# Patient Record
Sex: Male | Born: 1950
Health system: Southern US, Community
[De-identification: ages and names within clinical notes are randomized; demographics above are authoritative.]

## PROBLEM LIST (undated history)

## (undated) DIAGNOSIS — I499 Cardiac arrhythmia, unspecified: Secondary | ICD-10-CM

## (undated) DIAGNOSIS — J449 Chronic obstructive pulmonary disease, unspecified: Secondary | ICD-10-CM

## (undated) DIAGNOSIS — J45909 Unspecified asthma, uncomplicated: Secondary | ICD-10-CM

## (undated) DIAGNOSIS — M1990 Unspecified osteoarthritis, unspecified site: Secondary | ICD-10-CM

## (undated) DIAGNOSIS — I10 Essential (primary) hypertension: Secondary | ICD-10-CM

---

## 1997-11-14 ENCOUNTER — Other Ambulatory Visit: Admission: RE | Admit: 1997-11-14 | Discharge: 1997-11-14 | Payer: Self-pay | Admitting: Family Medicine

## 1997-11-22 ENCOUNTER — Other Ambulatory Visit: Admission: RE | Admit: 1997-11-22 | Discharge: 1997-11-22 | Payer: Self-pay | Admitting: Family Medicine

## 1997-12-17 ENCOUNTER — Other Ambulatory Visit: Admission: RE | Admit: 1997-12-17 | Discharge: 1997-12-17 | Payer: Self-pay | Admitting: Family Medicine

## 1998-01-31 ENCOUNTER — Other Ambulatory Visit: Admission: RE | Admit: 1998-01-31 | Discharge: 1998-01-31 | Payer: Self-pay | Admitting: Family Medicine

## 1998-03-04 ENCOUNTER — Other Ambulatory Visit: Admission: RE | Admit: 1998-03-04 | Discharge: 1998-03-04 | Payer: Self-pay | Admitting: Family Medicine

## 1998-05-07 ENCOUNTER — Ambulatory Visit (HOSPITAL_COMMUNITY): Admission: RE | Admit: 1998-05-07 | Discharge: 1998-05-07 | Payer: Self-pay | Admitting: Cardiovascular Disease

## 1998-05-08 ENCOUNTER — Ambulatory Visit: Admission: RE | Admit: 1998-05-08 | Discharge: 1998-05-08 | Payer: Self-pay | Admitting: *Deleted

## 1998-05-12 ENCOUNTER — Inpatient Hospital Stay (HOSPITAL_COMMUNITY): Admission: RE | Admit: 1998-05-12 | Discharge: 1998-05-17 | Payer: Self-pay | Admitting: *Deleted

## 1998-05-13 ENCOUNTER — Encounter: Payer: Self-pay | Admitting: Cardiothoracic Surgery

## 1998-05-14 ENCOUNTER — Encounter: Payer: Self-pay | Admitting: Cardiothoracic Surgery

## 1998-05-15 ENCOUNTER — Encounter: Payer: Self-pay | Admitting: Cardiothoracic Surgery

## 1998-05-21 ENCOUNTER — Emergency Department (HOSPITAL_COMMUNITY): Admission: EM | Admit: 1998-05-21 | Discharge: 1998-05-21 | Payer: Self-pay | Admitting: Emergency Medicine

## 1998-05-21 ENCOUNTER — Encounter: Payer: Self-pay | Admitting: Emergency Medicine

## 1998-09-29 ENCOUNTER — Encounter: Payer: Self-pay | Admitting: Emergency Medicine

## 1998-09-30 ENCOUNTER — Inpatient Hospital Stay (HOSPITAL_COMMUNITY): Admission: EM | Admit: 1998-09-30 | Discharge: 1998-10-02 | Payer: Self-pay | Admitting: Emergency Medicine

## 1999-01-15 ENCOUNTER — Ambulatory Visit (HOSPITAL_COMMUNITY): Admission: RE | Admit: 1999-01-15 | Discharge: 1999-01-15 | Payer: Self-pay | Admitting: Internal Medicine

## 1999-01-29 ENCOUNTER — Emergency Department (HOSPITAL_COMMUNITY): Admission: EM | Admit: 1999-01-29 | Discharge: 1999-01-29 | Payer: Self-pay | Admitting: Emergency Medicine

## 1999-01-29 ENCOUNTER — Encounter: Payer: Self-pay | Admitting: Emergency Medicine

## 1999-06-22 ENCOUNTER — Emergency Department (HOSPITAL_COMMUNITY): Admission: EM | Admit: 1999-06-22 | Discharge: 1999-06-22 | Payer: Self-pay | Admitting: Emergency Medicine

## 1999-06-22 ENCOUNTER — Encounter: Payer: Self-pay | Admitting: Emergency Medicine

## 1999-06-22 ENCOUNTER — Encounter: Payer: Self-pay | Admitting: *Deleted

## 2000-07-25 ENCOUNTER — Ambulatory Visit (HOSPITAL_BASED_OUTPATIENT_CLINIC_OR_DEPARTMENT_OTHER): Admission: RE | Admit: 2000-07-25 | Discharge: 2000-07-25 | Payer: Self-pay | Admitting: *Deleted

## 2000-11-06 ENCOUNTER — Emergency Department (HOSPITAL_COMMUNITY): Admission: EM | Admit: 2000-11-06 | Discharge: 2000-11-06 | Payer: Self-pay | Admitting: Emergency Medicine

## 2000-11-18 ENCOUNTER — Encounter: Payer: Self-pay | Admitting: Surgery

## 2000-11-21 ENCOUNTER — Ambulatory Visit (HOSPITAL_COMMUNITY): Admission: RE | Admit: 2000-11-21 | Discharge: 2000-11-21 | Payer: Self-pay | Admitting: Surgery

## 2000-11-21 ENCOUNTER — Encounter: Payer: Self-pay | Admitting: Surgery

## 2000-11-21 ENCOUNTER — Encounter: Admission: RE | Admit: 2000-11-21 | Discharge: 2000-11-21 | Payer: Self-pay | Admitting: Surgery

## 2000-11-22 ENCOUNTER — Ambulatory Visit (HOSPITAL_COMMUNITY): Admission: RE | Admit: 2000-11-22 | Discharge: 2000-11-22 | Payer: Self-pay | Admitting: Surgery

## 2002-01-05 ENCOUNTER — Encounter: Admission: RE | Admit: 2002-01-05 | Discharge: 2002-01-05 | Payer: Self-pay | Admitting: Cardiothoracic Surgery

## 2002-01-05 ENCOUNTER — Encounter: Payer: Self-pay | Admitting: Cardiothoracic Surgery

## 2002-05-10 ENCOUNTER — Encounter: Admission: RE | Admit: 2002-05-10 | Discharge: 2002-05-10 | Payer: Self-pay

## 2002-05-29 ENCOUNTER — Encounter: Admission: RE | Admit: 2002-05-29 | Discharge: 2002-05-29 | Payer: Self-pay

## 2002-11-16 ENCOUNTER — Ambulatory Visit (HOSPITAL_COMMUNITY): Admission: RE | Admit: 2002-11-16 | Discharge: 2002-11-16 | Payer: Self-pay | Admitting: *Deleted

## 2002-11-16 ENCOUNTER — Encounter: Payer: Self-pay | Admitting: *Deleted

## 2003-01-11 ENCOUNTER — Encounter: Payer: Self-pay | Admitting: Cardiothoracic Surgery

## 2003-01-11 ENCOUNTER — Encounter: Admission: RE | Admit: 2003-01-11 | Discharge: 2003-01-11 | Payer: Self-pay | Admitting: Cardiothoracic Surgery

## 2003-02-27 ENCOUNTER — Encounter: Payer: Self-pay | Admitting: Neurosurgery

## 2003-03-01 ENCOUNTER — Ambulatory Visit (HOSPITAL_COMMUNITY): Admission: RE | Admit: 2003-03-01 | Discharge: 2003-03-02 | Payer: Self-pay | Admitting: Neurosurgery

## 2003-03-01 ENCOUNTER — Encounter: Payer: Self-pay | Admitting: Neurosurgery

## 2003-09-19 ENCOUNTER — Emergency Department (HOSPITAL_COMMUNITY): Admission: EM | Admit: 2003-09-19 | Discharge: 2003-09-20 | Payer: Self-pay

## 2004-01-10 ENCOUNTER — Encounter: Admission: RE | Admit: 2004-01-10 | Discharge: 2004-01-10 | Payer: Self-pay | Admitting: Cardiothoracic Surgery

## 2004-04-18 ENCOUNTER — Emergency Department (HOSPITAL_COMMUNITY): Admission: EM | Admit: 2004-04-18 | Discharge: 2004-04-18 | Payer: Self-pay | Admitting: *Deleted

## 2008-02-25 ENCOUNTER — Observation Stay (HOSPITAL_COMMUNITY): Admission: EM | Admit: 2008-02-25 | Discharge: 2008-02-27 | Payer: Self-pay | Admitting: Emergency Medicine

## 2010-12-29 NOTE — Discharge Summary (Signed)
Jacob Perez, Jacob Perez NO.:  [id number]   MEDICAL RECORD NO.:  [id number]          PATIENT TYPE:  INP   LOCATION:  3706                         FACILITY:  [facility]   PHYSICIAN:  Hillery Aldo, M.D.   DATE OF BIRTH:  Feb 07, 1951   DATE OF ADMISSION:  02/25/2008  DATE OF DISCHARGE:  02/27/2008                               DISCHARGE SUMMARY   PRIMARY CARE PHYSICIAN:  Dr. Deetta Perla.   CARDIOLOGIST:  Nanetta Batty, M.D.   CONSULTATIONS:  Dr. Allyson Sabal of Cardiology.   DISCHARGE DIAGNOSES:  1. Atypical chest pain.  2. Gastroesophageal reflux disease.  3. Tobacco abuse.  4. Depression.  5. Left lower lobe lung nodule, stable and benign by serial      radiography.  6. Chronic obstructive pulmonary disease.  7. Dyslipidemia.   DISCHARGE MEDICATIONS:  1. Lexapro 10 mg daily.  2. Aspirin 81 mg daily.  3. Protonix 40 mg daily.   BRIEF ADMISSION HPI:  The patient is a 60 year old male who presented to  the hospital with a chief complaint of chest and epigastric discomfort  accompanied by nausea and vomiting.  Because of his risk factor profile,  he was admitted to rule out acute coronary syndrome.  For full details,  please see the dictated report done by Dr. Lovell Sheehan.   PROCEDURES AND DIAGNOSTIC STUDIES:  1. Two views of chest on February 25, 2008, showed no acute      cardiopulmonary abnormality.  A 11-mm benign left lower lobe      nodule.  2. CT scan of the chest on February 25, 2008, for upper lobe predominant      paraseptal emphysema with no acute cardiopulmonary abnormality.  A      11-mm noncalcified pulmonary nodule in the lateral basal segment of      the left lower lobe is unchanged from scan dating back to 2004 and      considered benign.  3. Nuclear medicine stress test on February 27, 2008, showed no definite      inducible ischemia with exercise stress.  54% ejection fraction.   DISCHARGE LABORATORY VALUES:  Cholesterol was 133, triglycerides 151,  HDL 18,  and LDL 85.   HOSPITAL COURSE:  1. Chest pain.  The patient's chest pain was felt to be noncardiac in      nature.  His cardiac enzymes were cycled q.8 h x3 sets and one      negative.  A 12-lead EKG tracings were nonacute.  There were no      events noted on telemetry.  No evidence of pulmonary embolism..  It      was felt that the patient's pain most likely was a symptom of      gastroesophageal reflux disease and the patient was put on proton      pump inhibitor therapy to address this.  He has followup with Dr.      Gery Pray in two week's time.  2. Tobacco abuse:  The patient was counseled extensively on cessation.      He declines any active treatment with Chantix and  other agents      currently.  3. Depression:  The patient is maintained on Lexapro.  4. Left lower lobe lung nodule, again this is stable and benign based      on serial imaging.  5. Chronic obstructive pulmonary disease:  The patient was counseled      regarding the importance of tobacco cessation.  6. Dyslipidemia:  The patient was instructed to eat a low-fat heart-      healthy diet and to increase his exercise.  7. Gastroesophageal reflux disease:  The patient does have symptoms      that she described as reflux of stomach gases into his posterior      pharynx.  He was started on proton pump inhibitor therapy.   DISPOSITION:  The patient is medically stable and will be discharged  home today.      Hillery Aldo, M.D.  Electronically Signed     CR/MEDQ  D:  02/27/2008  T:  02/28/2008  Job:  [id number]   cc:   Dr. Deetta Perla  Nanetta Batty, M.D.

## 2010-12-29 NOTE — H&P (Signed)
Jacob Perez, LEAVY NO.:  [id number]   MEDICAL RECORD NO.:  [id number]          PATIENT TYPE:  INP   LOCATION:  1823                         FACILITY:  [facility]   PHYSICIAN:  Della Goo, M.D. DATE OF BIRTH:  07/08/1951   DATE OF ADMISSION:  02/25/2008  DATE OF DISCHARGE:                              HISTORY & PHYSICAL   PRIMARY CARE PHYSICIAN:  Unassigned.   CHIEF COMPLAINT:  Epigastric discomfort, nausea, vomiting.   HISTORY OF PRESENT ILLNESS:  This is a 60 year old male presenting to  the emergency department with complaints of sudden onset of diaphoresis,  nausea and vomiting, then epigastric abdominal discomfort.  He reports  the symptoms started at about 10:30 p.m. on the evening prior to going  to the emergency department.  He reports vomiting 3-4 times and vomiting  watery emesis.  He denies any hematemesis.  He denies having any  symptoms of constipation or diarrhea.  The patient also denies having  any lightheadedness, syncope, fevers, chills, shortness of breath.  The  patient does report having a headache.  He denies having any previous  similar symptoms to this.   PAST MEDICAL HISTORY:  1. Open heart surgery in 1998 to repair an atrial septal defect.  2. History of a previous back surgery and a left inguinal hernia      repair along with an umbilical hernia repair.  3. The patient also has history of tobacco abuse.   MEDICATIONS:  Lexapro 10 mg one p.o. daily.   ALLERGIES:  No known drug allergies.   SOCIAL HISTORY:  The patient is a smoker.  He smokes one pack per day  for 40 years.  He rarely drinks alcohol.  Reports 3-4 beers every blue  moon.   FAMILY HISTORY:  Positive for coronary artery disease and hypertension  in his daughter.  The daughter is at bedside and reports that she has  unstable angina.  No history of diabetes in the family, and a strong  maternal family history for leukemia.  Mother, maternal aunt and  maternal  uncle had leukemia.  Also, the patient's father had emphysema  and was a heavy smoker.   PHYSICAL EXAMINATION:  GENERAL:  This is a thin, 60 year old well-  developed male in discomfort but no acute distress.  VITAL SIGNS:  Temperature 98.3, blood pressure 107/67, heart rate 67,  respirations 20.  O2 saturations 97% on room air.  HEENT:  Normocephalic, atraumatic.  There is no scleral icterus.  Pupils  equal, round and reactive to light.  Extraocular movements intact.  Funduscopic benign.  Oropharynx is clear.  The patient is edentulous.  He has dentures present on the upper and lower palates.  NECK:  Supple.  Full range of motion.  No thyromegaly, adenopathy,  jugular venous distention.  CARDIOVASCULAR:  Regular rate and rhythm.  No murmurs, rubs or gallops.  LUNGS:  Clear to auscultation after patient clears his throat and  coughs.  No rales, rhonchi or wheezes appreciated.  ABDOMEN:  Positive bowel sounds.  Soft, nontender, nondistended.  There  is no hepatosplenomegaly.  No rebound.  No guarding.  EXTREMITIES:  Without cyanosis, clubbing or edema.  NEUROLOGICAL:  Nonfocal.   LABORATORY DATA:  White blood cell count 15.1, hemoglobin 14.9,  hematocrit 41.5, platelets 210, neutrophils 74%, lymphocytes 20%.  Sodium 139, potassium 4.0, chloride 109, bicarbonate 26, BUN 16,  creatinine 0.90 and glucose 109.  Lipase level 28.  Myoglobin 54.8, CK  MB less than 1.0, troponin less than 0.05.  Cardiac enzymes with a total  CK of 71.   Chest x-ray reveals no acute disease process.  However, an 11 mm benign  left lower lobe nodule is sited.   ASSESSMENT:  A 60 year old male being admitted with:  1. Epigastric abdominal pain.  2. Atypical chest pain.  3. Nausea and vomiting.  4. Left lower lobe lung nodule.  5. Tobacco abuse.  6. Depression.   PLAN:  The patient will be admitted to the telemetry area for cardiac  monitoring.  Cardiac enzymes will be performed.  The patient will be   placed on topical nitrates, oxygen and aspirin therapy.  Antiemetics  have been ordered along with clear liquids which will advance as patient  tolerates.  IV fluids have also been ordered for maintenance therapy,  and the patient will be placed on DVT and GI prophylaxis.  Further  cardiac workup and evaluation will be considered pending outcome of  patient's cardiac enzymes and his clinical symptoms.  Also, a CT scan of  the chest without contrast has been ordered to further evaluate the  suspected benign nodules and the left lower lobe of the lung.  This has  been ordered without contrast, because the patient reports and  intolerance of nausea when he receives CT scans.  The patient does  report having previous surveillance of this area in the past.      Della Goo, M.D.  Electronically Signed     HJ/MEDQ  D:  02/25/2008  T:  02/25/2008  Job:  [id number]

## 2011-01-01 NOTE — Op Note (Signed)
Jacob Perez, Jacob Perez                            ACCOUNT NO.:  [id number]   MEDICAL RECORD NO.:  [id number]                   PATIENT TYPE:  OIB   LOCATION:  3012                                 FACILITY:  [facility]   PHYSICIAN:  Coletta Memos, M.D.                  DATE OF BIRTH:  June 04, 1951   DATE OF PROCEDURE:  03/01/2003  DATE OF DISCHARGE:  03/02/2003                                 OPERATIVE REPORT   PREOPERATIVE DIAGNOSIS:  L4-L5 lumbar stenosis, L4-L5 displaced disc left.   POSTOPERATIVE DIAGNOSIS:  Displaced disc left L4-L5, lumbar stenosis L4-L5,  displaced disc left L5-S1.   PROCEDURE:  Left L4-L5 hemilaminectomy and discectomy, left L5-S1  hemilaminectomy and discectomy, both with microdissection.   COMPLICATIONS:  None.   SURGEON:  Coletta Memos, M.D.   ASSISTANT:  Stefani Dama, M.D.   INDICATIONS FOR PROCEDURE:  Ravon Mortellaro presented with severe pain in the  left lower extremity which manifested itself with weakness in the left  gastrocnemius and also a large displaced disc at L5-S1 with associated  fragment.  I recommended and he agreed to undergo operative decompression.   OPERATIVE NOTE:  Mr. Pilger was brought to the operating room, intubated,  and placed under general anesthesia without difficulty.  He was rolled prone  onto a Wilson frame and all pressure points were properly padded.  His skin  was prepped and he was draped in a sterile fashion.  Using a preoperative  localizing x-ray, I infiltrated 10 mL of 0.5% Marcaine into the paraspinous  musculature in the lumbar region.  I then opened the incision with a #10  blade and took this down to the thoracolumbar fascia sharply.  I exposed the  lamina of L4, L5, and S1.  I took an interoperative x-ray and this showed my  double ended knife inferior to the lamina of L5-S1.  At this spot, without a  laminectomy, I was able to perform discectomy after freeing the ligamentum  flavum from its attachment to L5,  removing that and exposing the thecal sac.  I exposed what was a very large disc.  I opened the disc space and removed  significant amount of disc material.  I then was able to deal with the  fragment and remove the fragment which had migrated caudally under the S1  nerve root.  After doing that and inspecting that area, I felt that there  was no more compression.  I then turned my attention to L4-L5.  I performed  a hemilaminectomy of L4 using the Kerrison punches.  I removed the  ligamentum flavum and exposed the thecal sac at L4.  There, the disc was  even larger tethering the L5 nerve root.  I opened the disc space with a #15  blade.  We then performed a discectomy removing a significant amount of disc  material from the disc space and  both rostrally and caudally above the disc  space.  After adequate decompression, we then irrigated the space.  I then  closed the wound in layered fashion using Vicryl sutures.  Dermabond was  used for a sterile dressing.  The patient tolerated the procedure well.                                               Coletta Memos, M.D.    KC/MEDQ  D:  03/01/2003  T:  03/02/2003  Job:  [id number]

## 2011-01-01 NOTE — Op Note (Signed)
[facility]  Patient:    Jacob Perez, Jacob Perez                         MRN: [id number] Proc. Date: 11/22/00 Attending:  Abigail Miyamoto A                           Operative Report  PREOPERATIVE DIAGNOSIS:  Bilateral inguinal hernia.  POSTOPERATIVE DIAGNOSIS:  Bilateral inguinal hernia.  OPERATION:  Bilateral laparoscopic inguinal hernia repair with mesh.  SURGEON:  Abigail Miyamoto, M.D.  ANESTHESIA:  General endotracheal anesthesia.  ESTIMATED BLOOD LOSS:  Minimal.  FINDINGS:  The patient was found to have two direct inguinal hernia defects.  PROCEDURE IN DETAIL:  The patient was brought to the operating room and identified as Jacob Perez.  He was placed supine on the operating table, and general anesthesia was induced.  His abdomen was then prepped and draped in the usual sterile fashion.  Using a #15 blade, a small transverse incision was below the umbilicus.  The incision was carried down through the fascia which was then opened with a scalpel.  The rectus muscle was identified.  The dissecting balloon was then passed underneath the rectus sheath to the pubis. The dissecting balloon was then insufflated, dissecting out the preperitoneal space.  The dissecting balloon was then removed, and the 11 mm balloon origin port was placed into the preperitoneal space and insufflation was begun. Next, two 5 mm ports were placed in the patients midline under direct vision. Dissection was then carried out in the preperitoneal space.  The patients right side was first dissected out.  The testicular cord and its structures were easily identified along with the direct hernia defect.  No indirect hernia defect was identified on the right side.  Next, the left side was dissected as well.  Again, the patient had an easily identified left direct defect without evidence of indirect hernia.  Next, three pieces of precut mesh were brought onto the field.  Each piece  of mesh was placed into the inguinal floor and each were tacked in place to the pubic tubercle and up and around the abdominal wall with several tacks.  Each piece of mesh appeared to cover the hernia defects adequately.  Hemostasis appeared to be achieved as well.  Both 5 mm ports were then removed under direct vision, and the preperitoneal space was deflated.  Both pieces of mesh appeared to lie appropriately as the space collapsed.  All ports were then removed.  All wounds were then anesthetized with 0.25% Marcaine.  The fascial defect at the umbilicus was then closed with a figure-of-eight 0 Vicryl suture.  All skin incisions were then closed with 4-0 Monocryl subcuticular sutures.  Steri-Strips, gauze, and tape were applied.  The patient tolerated the procedure well.   All sponge, needle, and instrument counts were correct at the end of the procedure.  The patient was then extubated in the operating room and taken in stable condition to the recovery room. DD:  11/22/00 TD:  11/22/00 Job: [id number]

## 2011-05-13 LAB — CBC
HCT: 41.5
MCV: 91.8
Platelets: 193
RBC: 4.52
WBC: 15.1 — ABNORMAL HIGH
WBC: 8.1

## 2011-05-13 LAB — DIFFERENTIAL
Basophils Absolute: 0
Basophils Absolute: 0
Basophils Relative: 0
Basophils Relative: 0
Eosinophils Relative: 2
Eosinophils Relative: 3
Lymphocytes Relative: 20
Lymphocytes Relative: 45
Monocytes Absolute: 0.4
Monocytes Relative: 5

## 2011-05-13 LAB — CARDIAC PANEL(CRET KIN+CKTOT+MB+TROPI)
CK, MB: 1.2
Relative Index: INVALID
Relative Index: INVALID
Total CK: 47
Total CK: 52
Troponin I: <0.01
Troponin I: <0.01

## 2011-05-13 LAB — COMPREHENSIVE METABOLIC PANEL
AST: 12
AST: 15
Albumin: 3.7
Alkaline Phosphatase: 49
BUN: 16
CO2: 26
Chloride: 105
Chloride: 109
Creatinine, Ser: 0.9
GFR calc Af Amer: >60
GFR calc Af Amer: >60
GFR calc non Af Amer: >60
Potassium: 3.8
Total Bilirubin: 0.7
Total Bilirubin: 1.1
Total Protein: 6.4

## 2011-05-13 LAB — CK TOTAL AND CKMB (NOT AT ARMC): CK, MB: 1.3

## 2011-05-13 LAB — POCT CARDIAC MARKERS: CKMB, poc: <1 — ABNORMAL LOW

## 2011-05-13 LAB — LIPID PANEL
HDL: 18 — ABNORMAL LOW
VLDL: 30

## 2011-05-13 LAB — TROPONIN I: Troponin I: <0.01

## 2011-05-13 LAB — LIPASE, BLOOD: Lipase: 28

## 2014-08-21 ENCOUNTER — Emergency Department: Payer: Self-pay | Admitting: Emergency Medicine

## 2014-08-21 LAB — URINALYSIS, COMPLETE
BILIRUBIN, UR: NEGATIVE
Bacteria: NONE SEEN
Blood: NEGATIVE
GLUCOSE, UR: NEGATIVE mg/dL (ref 0–75)
KETONE: NEGATIVE
Leukocyte Esterase: NEGATIVE
Nitrite: NEGATIVE
Ph: 5 (ref 4.5–8.0)
Protein: NEGATIVE
SPECIFIC GRAVITY: 1.019 (ref 1.003–1.030)
Squamous Epithelial: <1

## 2014-08-21 LAB — COMPREHENSIVE METABOLIC PANEL
Albumin: 3.7 g/dL (ref 3.4–5.0)
Alkaline Phosphatase: 67 U/L
Anion Gap: 5 — ABNORMAL LOW (ref 7–16)
BILIRUBIN TOTAL: 0.5 mg/dL (ref 0.2–1.0)
BUN: 14 mg/dL (ref 7–18)
Calcium, Total: 9.1 mg/dL (ref 8.5–10.1)
Chloride: 106 mmol/L (ref 98–107)
Co2: 28 mmol/L (ref 21–32)
Creatinine: 1.01 mg/dL (ref 0.60–1.30)
EGFR (Non-African Amer.): >60
GLUCOSE: 97 mg/dL (ref 65–99)
Osmolality: 278 (ref 275–301)
POTASSIUM: 4.1 mmol/L (ref 3.5–5.1)
SGOT(AST): 18 U/L (ref 15–37)
SGPT (ALT): 18 U/L
SODIUM: 139 mmol/L (ref 136–145)
TOTAL PROTEIN: 7.7 g/dL (ref 6.4–8.2)

## 2014-08-21 LAB — CBC
HCT: 46.2 % (ref 40.0–52.0)
HGB: 15.6 g/dL (ref 13.0–18.0)
MCH: 31.4 pg (ref 26.0–34.0)
MCHC: 33.8 g/dL (ref 32.0–36.0)
MCV: 93 fL (ref 80–100)
Platelet: 255 10*3/uL (ref 150–440)
RBC: 4.98 10*6/uL (ref 4.40–5.90)
RDW: 13.1 % (ref 11.5–14.5)
WBC: 9.2 10*3/uL (ref 3.8–10.6)

## 2014-08-21 LAB — LIPASE, BLOOD: Lipase: 105 U/L (ref 73–393)

## 2015-10-01 ENCOUNTER — Inpatient Hospital Stay: Payer: BLUE CROSS/BLUE SHIELD

## 2015-10-01 ENCOUNTER — Emergency Department: Payer: BLUE CROSS/BLUE SHIELD

## 2015-10-01 ENCOUNTER — Inpatient Hospital Stay
Admission: EM | Admit: 2015-10-01 | Discharge: 2015-10-05 | DRG: 190 | Disposition: A | Discharge status: Home / Self Care | Payer: BLUE CROSS/BLUE SHIELD | Attending: Internal Medicine | Admitting: Internal Medicine

## 2015-10-01 DIAGNOSIS — J180 Bronchopneumonia, unspecified organism: Secondary | ICD-10-CM | POA: Diagnosis present

## 2015-10-01 DIAGNOSIS — J45909 Unspecified asthma, uncomplicated: Secondary | ICD-10-CM | POA: Diagnosis present

## 2015-10-01 DIAGNOSIS — M5441 Lumbago with sciatica, right side: Secondary | ICD-10-CM

## 2015-10-01 DIAGNOSIS — I2510 Atherosclerotic heart disease of native coronary artery without angina pectoris: Secondary | ICD-10-CM | POA: Diagnosis present

## 2015-10-01 DIAGNOSIS — G8929 Other chronic pain: Secondary | ICD-10-CM | POA: Diagnosis present

## 2015-10-01 DIAGNOSIS — M4806 Spinal stenosis, lumbar region: Secondary | ICD-10-CM | POA: Diagnosis present

## 2015-10-01 DIAGNOSIS — R0902 Hypoxemia: Secondary | ICD-10-CM

## 2015-10-01 DIAGNOSIS — J189 Pneumonia, unspecified organism: Secondary | ICD-10-CM | POA: Diagnosis not present

## 2015-10-01 DIAGNOSIS — M5442 Lumbago with sciatica, left side: Secondary | ICD-10-CM | POA: Diagnosis present

## 2015-10-01 DIAGNOSIS — I10 Essential (primary) hypertension: Secondary | ICD-10-CM | POA: Diagnosis present

## 2015-10-01 DIAGNOSIS — J1008 Influenza due to other identified influenza virus with other specified pneumonia: Secondary | ICD-10-CM | POA: Diagnosis present

## 2015-10-01 DIAGNOSIS — Z79899 Other long term (current) drug therapy: Secondary | ICD-10-CM

## 2015-10-01 DIAGNOSIS — Z981 Arthrodesis status: Secondary | ICD-10-CM

## 2015-10-01 DIAGNOSIS — J440 Chronic obstructive pulmonary disease with acute lower respiratory infection: Secondary | ICD-10-CM | POA: Diagnosis present

## 2015-10-01 DIAGNOSIS — R079 Chest pain, unspecified: Secondary | ICD-10-CM | POA: Diagnosis not present

## 2015-10-01 DIAGNOSIS — J9601 Acute respiratory failure with hypoxia: Secondary | ICD-10-CM | POA: Diagnosis present

## 2015-10-01 DIAGNOSIS — M5136 Other intervertebral disc degeneration, lumbar region: Secondary | ICD-10-CM | POA: Diagnosis present

## 2015-10-01 DIAGNOSIS — J441 Chronic obstructive pulmonary disease with (acute) exacerbation: Secondary | ICD-10-CM | POA: Diagnosis present

## 2015-10-01 DIAGNOSIS — Z91041 Radiographic dye allergy status: Secondary | ICD-10-CM

## 2015-10-01 DIAGNOSIS — R109 Unspecified abdominal pain: Secondary | ICD-10-CM | POA: Diagnosis present

## 2015-10-01 DIAGNOSIS — M545 Low back pain, unspecified: Secondary | ICD-10-CM | POA: Insufficient documentation

## 2015-10-01 DIAGNOSIS — M51369 Other intervertebral disc degeneration, lumbar region without mention of lumbar back pain or lower extremity pain: Secondary | ICD-10-CM

## 2015-10-01 DIAGNOSIS — J449 Chronic obstructive pulmonary disease, unspecified: Secondary | ICD-10-CM | POA: Diagnosis present

## 2015-10-01 DIAGNOSIS — Z23 Encounter for immunization: Secondary | ICD-10-CM | POA: Diagnosis not present

## 2015-10-01 DIAGNOSIS — F17210 Nicotine dependence, cigarettes, uncomplicated: Secondary | ICD-10-CM | POA: Diagnosis present

## 2015-10-01 DIAGNOSIS — J209 Acute bronchitis, unspecified: Secondary | ICD-10-CM | POA: Diagnosis present

## 2015-10-01 DIAGNOSIS — IMO0002 Reserved for concepts with insufficient information to code with codable children: Secondary | ICD-10-CM

## 2015-10-01 DIAGNOSIS — M5387 Other specified dorsopathies, lumbosacral region: Secondary | ICD-10-CM | POA: Diagnosis present

## 2015-10-01 HISTORY — DX: Unspecified osteoarthritis, unspecified site: M19.90

## 2015-10-01 HISTORY — DX: Essential (primary) hypertension: I10

## 2015-10-01 HISTORY — DX: Unspecified asthma, uncomplicated: J45.909

## 2015-10-01 LAB — BASIC METABOLIC PANEL
ANION GAP: 6 (ref 5–15)
BUN: 21 mg/dL — AB (ref 6–20)
CHLORIDE: 105 mmol/L (ref 101–111)
CO2: 29 mmol/L (ref 22–32)
Calcium: 8.7 mg/dL — ABNORMAL LOW (ref 8.9–10.3)
Creatinine, Ser: 1.06 mg/dL (ref 0.61–1.24)
GFR calc Af Amer: >60 mL/min (ref >60)
GFR calc non Af Amer: >60 mL/min (ref >60)
GLUCOSE: 125 mg/dL — AB (ref 65–99)
POTASSIUM: 3.8 mmol/L (ref 3.5–5.1)
SODIUM: 140 mmol/L (ref 135–145)

## 2015-10-01 LAB — FIBRIN DERIVATIVES D-DIMER (ARMC ONLY): Fibrin derivatives D-dimer (ARMC): 596 — ABNORMAL HIGH (ref 0–499)

## 2015-10-01 LAB — CBC
HEMATOCRIT: 45.3 % (ref 40.0–52.0)
HEMOGLOBIN: 15.6 g/dL (ref 13.0–18.0)
MCH: 31 pg (ref 26.0–34.0)
MCHC: 34.3 g/dL (ref 32.0–36.0)
MCV: 90.4 fL (ref 80.0–100.0)
Platelets: 188 10*3/uL (ref 150–440)
RBC: 5.02 MIL/uL (ref 4.40–5.90)
RDW: 13.8 % (ref 11.5–14.5)
WBC: 10.7 10*3/uL — AB (ref 3.8–10.6)

## 2015-10-01 LAB — INFLUENZA PANEL BY PCR (TYPE A & B)
H1N1FLUPCR: NOT DETECTED
INFLBPCR: NEGATIVE
Influenza A By PCR: POSITIVE — AB

## 2015-10-01 LAB — TROPONIN I

## 2015-10-01 MED ORDER — IPRATROPIUM-ALBUTEROL 0.5-2.5 (3) MG/3ML IN SOLN
3.0000 mL | Freq: Once | RESPIRATORY_TRACT | Status: AC
  Administered 2015-10-01: 3 mL via RESPIRATORY_TRACT
  Filled 2015-10-01: qty 3

## 2015-10-01 MED ORDER — PREDNISONE 20 MG PO TABS: 60.0000 mg | ORAL_TABLET | Freq: Every day | ORAL | Status: DC

## 2015-10-01 MED ORDER — LEVOFLOXACIN 500 MG PO TABS
500.0000 mg | ORAL_TABLET | Freq: Every day | ORAL | Status: DC
  Administered 2015-10-02 – 2015-10-04 (×3): 500 mg via ORAL
  Filled 2015-10-01 (×4): qty 1

## 2015-10-01 MED ORDER — SODIUM CHLORIDE 0.9% FLUSH
3.0000 mL | Freq: Two times a day (BID) | INTRAVENOUS | Status: DC
  Administered 2015-10-01 – 2015-10-04 (×8): 3 mL via INTRAVENOUS

## 2015-10-01 MED ORDER — LEVOFLOXACIN IN D5W 750 MG/150ML IV SOLN
750.0000 mg | Freq: Once | INTRAVENOUS | Status: AC
  Administered 2015-10-01: 750 mg via INTRAVENOUS
  Filled 2015-10-01: qty 150

## 2015-10-01 MED ORDER — ONDANSETRON HCL 4 MG/2ML IJ SOLN
4.0000 mg | Freq: Once | INTRAMUSCULAR | Status: AC
  Administered 2015-10-01: 4 mg via INTRAVENOUS
  Filled 2015-10-01: qty 2

## 2015-10-01 MED ORDER — IPRATROPIUM-ALBUTEROL 0.5-2.5 (3) MG/3ML IN SOLN
3.0000 mL | Freq: Once | RESPIRATORY_TRACT | Status: AC
  Administered 2015-10-01: 3 mL via RESPIRATORY_TRACT
  Filled 2015-10-01 (×2): qty 3

## 2015-10-01 MED ORDER — ACETAMINOPHEN 325 MG PO TABS: 650.0000 mg | ORAL_TABLET | Freq: Four times a day (QID) | ORAL | Status: DC | PRN

## 2015-10-01 MED ORDER — CYCLOBENZAPRINE HCL 10 MG PO TABS: 10.0000 mg | ORAL_TABLET | Freq: Three times a day (TID) | ORAL | Status: AC | PRN

## 2015-10-01 MED ORDER — OSELTAMIVIR PHOSPHATE 75 MG PO CAPS
75.0000 mg | ORAL_CAPSULE | Freq: Two times a day (BID) | ORAL | Status: DC
  Administered 2015-10-02 – 2015-10-04 (×6): 75 mg via ORAL
  Filled 2015-10-01 (×10): qty 1

## 2015-10-01 MED ORDER — PNEUMOCOCCAL VAC POLYVALENT 25 MCG/0.5ML IJ INJ
0.5000 mL | INJECTION | INTRAMUSCULAR | Status: AC
  Administered 2015-10-03: 0.5 mL via INTRAMUSCULAR
  Filled 2015-10-01 (×2): qty 0.5

## 2015-10-01 MED ORDER — DIPHENHYDRAMINE HCL 50 MG/ML IJ SOLN
50.0000 mg | Freq: Once | INTRAMUSCULAR | Status: AC
  Administered 2015-10-01: 50 mg via INTRAVENOUS
  Filled 2015-10-01: qty 1

## 2015-10-01 MED ORDER — SENNOSIDES-DOCUSATE SODIUM 8.6-50 MG PO TABS
1.0000 | ORAL_TABLET | Freq: Every evening | ORAL | Status: DC | PRN
Start: 2015-10-01 — End: 2015-10-05

## 2015-10-01 MED ORDER — HYDROCORTISONE NA SUCCINATE PF 100 MG IJ SOLR
200.0000 mg | Freq: Once | INTRAMUSCULAR | Status: AC
  Administered 2015-10-01: 200 mg via INTRAVENOUS
  Filled 2015-10-01: qty 4

## 2015-10-01 MED ORDER — HYDROMORPHONE HCL 1 MG/ML IJ SOLN
INTRAMUSCULAR | Status: AC
  Administered 2015-10-01: 1 mg via INTRAVENOUS
  Filled 2015-10-01: qty 1

## 2015-10-01 MED ORDER — ALBUTEROL SULFATE (2.5 MG/3ML) 0.083% IN NEBU
2.5000 mg | INHALATION_SOLUTION | RESPIRATORY_TRACT | Status: DC
  Administered 2015-10-01 – 2015-10-04 (×16): 2.5 mg via RESPIRATORY_TRACT
  Filled 2015-10-01 (×16): qty 3

## 2015-10-01 MED ORDER — ONDANSETRON HCL 4 MG PO TABS: 4.0000 mg | ORAL_TABLET | Freq: Four times a day (QID) | ORAL | Status: DC | PRN

## 2015-10-01 MED ORDER — METHYLPREDNISOLONE SODIUM SUCC 125 MG IJ SOLR
125.0000 mg | Freq: Once | INTRAMUSCULAR | Status: AC
  Administered 2015-10-01: 125 mg via INTRAVENOUS
  Filled 2015-10-01: qty 2

## 2015-10-01 MED ORDER — HYDROCODONE-ACETAMINOPHEN 5-325 MG PO TABS
1.0000 | ORAL_TABLET | ORAL | Status: DC | PRN
  Administered 2015-10-01: 2 via ORAL
  Administered 2015-10-01: 1 via ORAL
  Administered 2015-10-01: 2 via ORAL
  Administered 2015-10-02: 1 via ORAL
  Filled 2015-10-01: qty 1
  Filled 2015-10-01 (×2): qty 2
  Filled 2015-10-01: qty 1

## 2015-10-01 MED ORDER — ENOXAPARIN SODIUM 40 MG/0.4ML ~~LOC~~ SOLN
40.0000 mg | SUBCUTANEOUS | Status: DC
  Administered 2015-10-01: 40 mg via SUBCUTANEOUS
  Filled 2015-10-01: qty 0.4

## 2015-10-01 MED ORDER — LISINOPRIL 10 MG PO TABS
10.0000 mg | ORAL_TABLET | Freq: Every day | ORAL | Status: DC
Start: 2015-10-01 — End: 2015-10-05
  Administered 2015-10-04: 10 mg via ORAL
  Filled 2015-10-01 (×3): qty 1

## 2015-10-01 MED ORDER — METHYLPREDNISOLONE SODIUM SUCC 125 MG IJ SOLR
60.0000 mg | Freq: Three times a day (TID) | INTRAMUSCULAR | Status: DC
  Administered 2015-10-01 – 2015-10-04 (×9): 60 mg via INTRAVENOUS
  Filled 2015-10-01 (×9): qty 2

## 2015-10-01 MED ORDER — ALPRAZOLAM 0.25 MG PO TABS: 0.2500 mg | ORAL_TABLET | Freq: Two times a day (BID) | ORAL | Status: DC | PRN

## 2015-10-01 MED ORDER — BARIUM SULFATE 2.1 % PO SUSP: 450.0000 mL | ORAL | Status: DC

## 2015-10-01 MED ORDER — ONDANSETRON HCL 4 MG/2ML IJ SOLN
4.0000 mg | Freq: Four times a day (QID) | INTRAMUSCULAR | Status: DC | PRN
  Administered 2015-10-04: 4 mg via INTRAVENOUS
  Filled 2015-10-01: qty 2

## 2015-10-01 MED ORDER — TIOTROPIUM BROMIDE MONOHYDRATE 18 MCG IN CAPS
18.0000 ug | ORAL_CAPSULE | Freq: Every day | RESPIRATORY_TRACT | Status: DC
  Administered 2015-10-01 – 2015-10-04 (×4): 18 ug via RESPIRATORY_TRACT
  Filled 2015-10-01: qty 5

## 2015-10-01 MED ORDER — ALUM & MAG HYDROXIDE-SIMETH 200-200-20 MG/5ML PO SUSP
30.0000 mL | Freq: Four times a day (QID) | ORAL | Status: DC | PRN
  Administered 2015-10-03: 30 mL via ORAL
  Filled 2015-10-01: qty 30

## 2015-10-01 MED ORDER — LEVALBUTEROL HCL 1.25 MG/0.5ML IN NEBU: 1.2500 mg | INHALATION_SOLUTION | Freq: Four times a day (QID) | RESPIRATORY_TRACT | Status: DC

## 2015-10-01 MED ORDER — ACETAMINOPHEN 650 MG RE SUPP: 650.0000 mg | Freq: Four times a day (QID) | RECTAL | Status: DC | PRN

## 2015-10-01 MED ORDER — MORPHINE SULFATE (PF) 4 MG/ML IV SOLN
4.0000 mg | Freq: Once | INTRAVENOUS | Status: AC
  Administered 2015-10-01: 4 mg via INTRAVENOUS
  Filled 2015-10-01: qty 1

## 2015-10-01 MED ORDER — HYDROMORPHONE HCL 1 MG/ML IJ SOLN
1.0000 mg | Freq: Once | INTRAMUSCULAR | Status: AC
Start: 2015-10-01 — End: 2015-10-01
  Administered 2015-10-01: 1 mg via INTRAVENOUS

## 2015-10-01 MED ORDER — IOHEXOL 350 MG/ML SOLN
100.0000 mL | Freq: Once | INTRAVENOUS | Status: AC | PRN
  Administered 2015-10-01: 100 mL via INTRAVENOUS

## 2015-10-01 NOTE — H&P (Signed)
[facility] NAME: Jacob Perez    MR#:  [id number]  DATE OF BIRTH:  11-13-1950  DATE OF ADMISSION:  10/01/2015  PRIMARY CARE PHYSICIAN: Fae Pippin   REQUESTING/REFERRING PHYSICIAN:  Dr. Jimmye Norman  CHIEF COMPLAINT:  Shortness of breath and back pain HISTORY OF PRESENT ILLNESS:  Jacob Perez  is a 65 y.o. male with a known history of asthma who presents with above complaint. Patient reports over the past 2-3 days he's had increasing shortness of breath, cough and fevers. Emergency room he was diagnosed with pneumonia and COPD exacerbation. He is also complaining of back pain. He says that since his been coughing he feels that he may have injured his lower back where he had previous surgery. He denies bowel or bladder incontinence. He has numbness at baseline of both of his feet.  PAST MEDICAL HISTORY:   Past Medical History  Diagnosis Date  . Arthritis   . Asthma   . Hypertension     PAST SURGICAL HISTORY:  Back surgery Hernia repair SOCIAL HISTORY:   Social History  Substance Use Topics  . Smoking status: Current Every Day Smoker  . Smokeless tobacco: No  . Alcohol Use: No    FAMILY HISTORY:  He does not recall his family history  DRUG ALLERGIES:   Allergies  Allergen Reactions  . Contrast Media [Iodinated Diagnostic Agents] Itching and Nausea And Vomiting     REVIEW OF SYSTEMS:  CONSTITUTIONAL: No fever, fatigue or weakness.  EYES: No blurred or double vision.  EARS, NOSE, AND THROAT: No tinnitus or ear pain.  RESPIRATORY: Positive for cough, shortness of breath, wheezing no hemoptysis  CARDIOVASCULAR: No chest pain, orthopnea, edema.  GASTROINTESTINAL: No nausea, vomiting, diarrhea or abdominal pain.  GENITOURINARY: No dysuria, hematuria.  ENDOCRINE: No polyuria, nocturia,  HEMATOLOGY: No anemia, easy bruising or bleeding SKIN: No rash or lesion. MUSCULOSKELETAL: Back pain NEUROLOGIC:  tingling, numbness, weakness chronic in nature.  PSYCHIATRY: No anxiety or depression.   MEDICATIONS AT HOME:   Prior to Admission medications   Medication Sig Start Date End Date Taking? Authorizing Provider  lisinopril (PRINIVIL,ZESTRIL) 10 MG tablet Take 10 mg by mouth daily.   Yes Historical Provider, MD  VENTOLIN HFA 108 (90 Base) MCG/ACT inhaler Inhale 2 puffs into the lungs every 4 (four) hours as needed. 08/22/15  Yes Historical Provider, MD  cyclobenzaprine (FLEXERIL) 10 MG tablet Take 1 tablet (10 mg total) by mouth 3 (three) times daily as needed for muscle spasms. 10/01/15 10/01/15  Gregor Hams, MD  predniSONE (DELTASONE) 20 MG tablet Take 3 tablets (60 mg total) by mouth daily with breakfast. 10/01/15 10/05/15  Gregor Hams, MD      VITAL SIGNS:  Blood pressure 107/61, pulse 93, temperature 98.3 F (36.8 C), temperature source Oral, resp. rate 12, height 6' (1.829 m), weight 76.204 kg (168 lb), SpO2 95 %.  PHYSICAL EXAMINATION:  GENERAL:  65 y.o.-year-old patient lying in the bed with no acute distress.  EYES: Pupils equal, round, reactive to light and accommodation. No scleral icterus. Extraocular muscles intact.  HEENT: Head atraumatic, normocephalic. Oropharynx and nasopharynx clear.  NECK:  Supple, no jugular venous distention. No thyroid enlargement, no tenderness.  LUNGS: Normal breath sounds bilaterally, no wheezing, rales,rhonchi or crepitation. No use of accessory muscles of respiration.  CARDIOVASCULAR: S1, S2 normal. No murmurs, rubs, or gallops.  ABDOMEN: Soft, nontender, nondistended. Bowel sounds present. No organomegaly or mass.  EXTREMITIES: No  pedal edema, cyanosis, or clubbing.  NEUROLOGIC: Cranial nerves II through XII are grossly intact. No focal deficits. PSYCHIATRIC: The patient is alert and oriented x 3.  SKIN: No obvious rash, lesion, or ulcer.  Back no significant tenderness LABORATORY PANEL:   CBC  Recent Labs Lab 10/01/15 0535  WBC  10.7*  HGB 15.6  HCT 45.3  PLT 188   ------------------------------------------------------------------------------------------------------------------  Chemistries   Recent Labs Lab 10/01/15 0535  NA 140  K 3.8  CL 105  CO2 29  GLUCOSE 125*  BUN 21*  CREATININE 1.06  CALCIUM 8.7*   ------------------------------------------------------------------------------------------------------------------  Cardiac Enzymes  Recent Labs Lab 10/01/15 0535  TROPONINI <0.03   ------------------------------------------------------------------------------------------------------------------  RADIOLOGY:  Ct Angio Chest Pe W/cm &/or Wo Cm  10/01/2015  CLINICAL DATA:  Shortness of breath over the last week. Elevated D-dimer. Left-sided abdominal and pelvic pain. EXAM: CT ANGIOGRAPHY CHEST WITH CONTRAST TECHNIQUE: Multidetector CT imaging of the chest was performed using the standard protocol during bolus administration of intravenous contrast. Multiplanar CT image reconstructions and MIPs were obtained to evaluate the vascular anatomy. CONTRAST:  166mL OMNIPAQUE IOHEXOL 350 MG/ML SOLN COMPARISON:  Radiography same day FINDINGS: Pulmonary arterial opacification is good. There are no pulmonary emboli. There is mild atherosclerosis of the aorta but no evidence of aneurysm or dissection. There is mild coronary artery calcification. The lungs show a background pattern of emphysema, upper lobe predominant. There is dependent bronchopneumonia in both lower lobes. No pleural or pericardial fluid. No hilar or mediastinal mass or lymphadenopathy. Slight reactive nodal enlargement. No significant bone finding. Review of the MIP images confirms the above findings. IMPRESSION: No pulmonary emboli. Bilateral lower lobe bronchopneumonia. Background pattern of centrilobular emphysema. Electronically Signed   By: Nelson Chimes M.D.   On: 10/01/2015 09:44   Ct Abdomen Pelvis W Contrast  10/01/2015  CLINICAL DATA:   Left-sided abdominal and pelvic pain. EXAM: CT ABDOMEN AND PELVIS WITH CONTRAST TECHNIQUE: Multidetector CT imaging of the abdomen and pelvis was performed using the standard protocol following bolus administration of intravenous contrast. CONTRAST:  152mL OMNIPAQUE IOHEXOL 350 MG/ML SOLN COMPARISON:  08/21/2014 FINDINGS: The liver has a normal appearance without focal lesions or biliary ductal dilatation. No calcified gallstones. The spleen is normal. The pancreas is normal. The adrenal glands are normal. The kidneys are normal except for a sub cm cyst on the right. There is atherosclerosis of the aorta and its branch vessels but no aneurysm. The IVC is normal. No retroperitoneal mass or adenopathy. No free intraperitoneal fluid or air. No bowel pathology other than mild diverticulosis without evidence of diverticulitis. Previous anterior abdominal wall surgery. Bladder, prostate gland and seminal vesicles are unremarkable. Chronic degenerative changes at the L5-S1 disc level. IMPRESSION: No acute finding. No cause of left-sided pain identified. Mild diverticulosis without imaging evidence of diverticulitis. Atherosclerosis of the aorta and its branch vessels. Electronically Signed   By: Nelson Chimes M.D.   On: 10/01/2015 09:47   Dg Chest Portable 1 View  10/01/2015  CLINICAL DATA:  Low back pain and increased shortness of breath for 3 days. Decreased oxygen sats. EXAM: PORTABLE CHEST 1 VIEW COMPARISON:  09/24/2014 FINDINGS: Postoperative changes in the mediastinum. Normal heart size and pulmonary vascularity. Emphysematous changes and fibrosis in the lungs. No focal consolidation. No blunting of costophrenic angles. No pneumothorax. IMPRESSION: Emphysematous changes in the lungs. No evidence of active pulmonary disease. Electronically Signed   By: Lucienne Capers M.D.   On: 10/01/2015 06:02    EKG:  Sinus tachycardia no ST elevation or depression  IMPRESSION AND PLAN:    65 year old male with  history of essential hypertension and asthma who presented with shortness of breath and found to have pneumonia as well as mild COPD exacerbation.  1. Acute hypoxic respiratory failure: This is due to community-acquired pneumonia and COPD exacerbation as outlined below.  2. Community-acquired pneumonia: Blood cultures were ordered in the emergency room. Continue Levaquin. Wean oxygen as tolerated.   3. COPD exacerbation: Patient has no wheezing on examination however when he arrived in the emergency room he did have wheezing. Continue with IV steroids and Xopenex due to tachycardia. Add Spiriva inhaler to patient's regimen.  4. Back pain: Order MRI of the lumbar spine to evaluate for compression fracture or stenosis.  5. Tobacco dependence: Patient is encouraged to stop smoking. Patient does want a nicotine patch. Counseled for 3 minutes.  6. Essential hypertension: Continue lisinopril    All the records are reviewed and case discussed with ED provider. Management plans discussed with the patient and HE is in agreement.  CODE STATUS: full  TOTAL TIME TAKING CARE OF THIS PATIENT: 50 minutes.    Judaea Burgoon M.D on 10/01/2015 at 11:05 AM  Between 7am to 6pm - Pager - [phone] After 6pm go to [email or web address] - password [email or web address] [facility]  Office  [phone]  CC: Primary care physician; Cyndi Bender, Hershal Coria

## 2015-10-01 NOTE — ED Notes (Signed)
Pt to CT via stretcher accomp by CT tech 

## 2015-10-01 NOTE — ED Provider Notes (Signed)
[facility] Emergency Department Provider Note  ____________________________________________  Time seen: 5:40 AM  I have reviewed the triage vital signs and the nursing notes.   HISTORY  Chief Complaint Back Pain and Shortness of Breath      HPI Jacob Perez is a 65 y.o. male presents via [facility] with 10 out of 10 lower back pain with onset on Monday. Patient states that while in route to work he coughed and experienced sharp pain in his lower back which radiates down his bilateral lower extremities. Patient states that the pain is consistent with when he had a herniated disc in the past for which she had spinal fusion performed. Patient denies any lower extremity weakness or numbness at this time. In addition the patient also admits to increasing cough and congestion. Patient states that he smokes a pack cigarette per day satting 89% on room air on presentation to the emergency Department with improvement to 93% on 2 L nasal cannula. Patient denies any fever     Past Medical History  Diagnosis Date  . Arthritis     There are no active problems to display for this patient.  Past surgical history Lumbar spinal fusion No current outpatient prescriptions on file.  Allergies Contrast media  No family history on file.  Social History Social History  Substance Use Topics  . Smoking status: Current Every Day Smoker  . Smokeless tobacco: None  . Alcohol Use: No    Review of Systems  Constitutional: Negative for fever. Eyes: Negative for visual changes. ENT: Negative for sore throat. Cardiovascular: Negative for chest pain. Respiratory: Positive for cough and shortness of breath Gastrointestinal: Negative for abdominal pain, vomiting and diarrhea. Genitourinary: Negative for dysuria. Musculoskeletal: Positive for back pain. Skin: Negative for rash. Neurological: Negative for headaches, focal weakness or numbness.   10-point ROS otherwise  negative.  ____________________________________________   PHYSICAL EXAM:  VITAL SIGNS: ED Triage Vitals  Enc Vitals Group     BP 10/01/15 0535 157/80 mmHg     Pulse Rate 10/01/15 0535 130     Resp 10/01/15 0535 26     Temp 10/01/15 0535 98.3 F (36.8 C)     Temp Source 10/01/15 0535 Oral     SpO2 10/01/15 0535 93 %     Weight 10/01/15 0536 168 lb (76.204 kg)     Height 10/01/15 0536 6' (1.829 m)     Head Cir --      Peak Flow --      Pain Score --      Pain Loc --      Pain Edu? --      Excl. in [place]? --      Constitutional: Alert and oriented. Apparent distress Eyes: Conjunctivae are normal. PERRL. Normal extraocular movements. ENT   Head: Normocephalic and atraumatic.   Nose: No congestion/rhinnorhea.   Mouth/Throat: Mucous membranes are moist.   Neck: No stridor. Hematological/Lymphatic/Immunilogical: No cervical lymphadenopathy. Cardiovascular: Normal rate, regular rhythm. Normal and symmetric distal pulses are present in all extremities. No murmurs, rubs, or gallops. Respiratory: Normal respiratory effort without tachypnea nor retractions. Bibasilar rhonchi Gastrointestinal: Soft and nontender. No distention. There is no CVA tenderness. Genitourinary: deferred Musculoskeletal: Nontender with normal range of motion in all extremities. No joint effusions.  No lower extremity tenderness nor edema. Neurologic:  Normal speech and language. No gross focal neurologic deficits are appreciated. Speech is normal.  Skin:  Skin is warm, dry and intact. No rash noted. Psychiatric: Mood  and affect are normal. Speech and behavior are normal. Patient exhibits appropriate insight and judgment.  ____________________________________________    LABS (pertinent positives/negatives)  Labs Reviewed  BASIC METABOLIC PANEL - Abnormal; Notable for the following:    Glucose, Bld 125 (*)    BUN 21 (*)    Calcium 8.7 (*)    All other components within normal limits  CBC -  Abnormal; Notable for the following:    WBC 10.7 (*)    All other components within normal limits  FIBRIN DERIVATIVES D-DIMER ([facility] ONLY) - Abnormal; Notable for the following:    Fibrin derivatives D-dimer ([facility]) 596 (*)    All other components within normal limits  INFLUENZA PANEL BY PCR (TYPE A & B, H1N1) - Abnormal; Notable for the following:    Influenza A By PCR POSITIVE (*)    All other components within normal limits  TROPONIN I  MISC [facility] TEST (SEND OUT)  BASIC METABOLIC PANEL  CBC     ____________________________________________   EKG  ED ECG REPORT I, Lorelle Macaluso, McBride N, the attending physician, personally viewed and interpreted this ECG.   Date: 10/01/2015  EKG Time: 2:35 AM  Rate: 129  Rhythm:Sinus tachycardia  Axis: Normal  Intervals: Normal   ST&T Change: None  ____________________________________________    RADIOLOGY     DG Chest Portable 1 View (Final result) Result time: 10/01/15 06:02:00   Final result by Rad Results In Interface (10/01/15 06:02:00)   Narrative:   CLINICAL DATA: Low back pain and increased shortness of breath for 3 days. Decreased oxygen sats.  EXAM: PORTABLE CHEST 1 VIEW  COMPARISON: 09/24/2014  FINDINGS: Postoperative changes in the mediastinum. Normal heart size and pulmonary vascularity. Emphysematous changes and fibrosis in the lungs. No focal consolidation. No blunting of costophrenic angles. No pneumothorax.  IMPRESSION: Emphysematous changes in the lungs. No evidence of active pulmonary disease.   Electronically Signed By: Lucienne Capers M.D. On: 10/01/2015 06:02       Critical care:CRITICAL CARE Performed by: Marjean Donna N   Total critical care time: 30 minutes  Critical care time was exclusive of separately billable procedures and treating other patients.  Critical care was necessary to treat or prevent imminent or life-threatening deterioration.  Critical care was time spent  personally by me on the following activities: development of treatment plan with patient and/or surrogate as well as nursing, discussions with consultants, evaluation of patient's response to treatment, examination of patient, obtaining history from patient or surrogate, ordering and performing treatments and interventions, ordering and review of laboratory studies, ordering and review of radiographic studies, pulse oximetry and re-evaluation of patient's condition.    INITIAL IMPRESSION / ASSESSMENT AND PLAN / ED COURSE  Pertinent labs & imaging results that were available during my care of the patient were reviewed by me and considered in my medical decision making (see chart for details). Patient's care transferred to Dr. Jimmye Norman at 7:30 AM pending CT scan of the chest. In addition concern for possible herniated lumbar disc. Patient received 2 DuoNeb's and Solu-Medrol 125 mg IV and emergency department  ____________________________________________   FINAL CLINICAL IMPRESSION(S) / ED DIAGNOSES  Final diagnoses:  Chronic obstructive pulmonary disease, unspecified COPD type ([place])  Bilateral low back pain with sciatica, sciatica laterality unspecified  Hypoxia  Community acquired pneumonia      Gregor Hams, MD 10/01/15 2248

## 2015-10-01 NOTE — Progress Notes (Signed)
ANTIBIOTIC CONSULT NOTE - INITIAL  Pharmacy Consult for Levofloxacin Indication: pneumonia  Allergies  Allergen Reactions  . Contrast Media [Iodinated Diagnostic Agents] Itching and Nausea And Vomiting    Patient Measurements: Height: 6' (182.9 cm) Weight: 166 lb (75.297 kg) IBW/kg (Calculated) : 77.6 Adjusted Body Weight:   Vital Signs: Temp: 98.9 F (37.2 C) (02/15 1143) Temp Source: Oral (02/15 1143) BP: 110/66 mmHg (02/15 1143) Pulse Rate: 94 (02/15 1143) Intake/Output from previous day:   Intake/Output from this shift: Total I/O In: 240 [P.O.:240] Out: -   Labs:  Recent Labs  10/01/15 0535  WBC 10.7*  HGB 15.6  PLT 188  CREATININE 1.06   Estimated Creatinine Clearance: 77 mL/min (by C-G formula based on Cr of 1.06). No results for input(s): VANCOTROUGH, VANCOPEAK, VANCORANDOM, GENTTROUGH, GENTPEAK, GENTRANDOM, TOBRATROUGH, TOBRAPEAK, TOBRARND, AMIKACINPEAK, AMIKACINTROU, AMIKACIN in the last 72 hours.   Microbiology: No results found for this or any previous visit (from the past 720 hour(s)).  Medical History: Past Medical History  Diagnosis Date  . Arthritis   . Asthma   . Hypertension     Medications:  Prescriptions prior to admission  Medication Sig Dispense Refill Last Dose  . lisinopril (PRINIVIL,ZESTRIL) 10 MG tablet Take 10 mg by mouth daily.   unknown at unknown  . VENTOLIN HFA 108 (90 Base) MCG/ACT inhaler Inhale 2 puffs into the lungs every 4 (four) hours as needed.  0 prn at prn   Scheduled:  . albuterol  2.5 mg Nebulization Q4H  . enoxaparin (LOVENOX) injection  40 mg Subcutaneous Q24H  . [START ON 10/02/2015] levofloxacin  500 mg Oral Daily  . lisinopril  10 mg Oral Daily  . methylPREDNISolone (SOLU-MEDROL) injection  60 mg Intravenous 3 times per day  . [START ON 10/02/2015] pneumococcal 23 valent vaccine  0.5 mL Intramuscular Tomorrow-1000  . sodium chloride flush  3 mL Intravenous Q12H  . tiotropium  18 mcg Inhalation Daily    Assessment: Pharmacy consulted to dose and monitor Levofloxacin in this 65 year old male. Patient is being treated for possible PNA and COPD exacerbation   Goal of Therapy:  Resolution of condition  Plan:  Patient received Levofloxacin 750 mg x 1. Will start levofloxacin 500 mg PO q24 hours.   Pharmacy to follow.   Keon Pender D 10/01/2015,3:09 PM

## 2015-10-01 NOTE — Consult Note (Signed)
ORTHOPAEDIC CONSULTATION  REQUESTING PHYSICIAN: Bettey Costa, MD  Chief Complaint: Low back pain with radicular symptoms  HPI: Jacob Perez is a 65 y.o. male who complains of  low back pain since Monday. He has been coughing and feels that his back pain began after a coughing episode. He has had tingling in his lower extremities. He denies any bowel or bladder dysfunction or significant weakness of lower extremities. He has a history of previous back surgery years ago.  Past Medical History  Diagnosis Date  . Arthritis   . Asthma   . Hypertension    History reviewed. No pertinent past surgical history. Social History   Social History  . Marital Status: Married    Spouse Name: N/A  . Number of Children: N/A  . Years of Education: N/A   Social History Main Topics  . Smoking status: Current Every Day Smoker  . Smokeless tobacco: None  . Alcohol Use: No  . Drug Use: None  . Sexual Activity: Not Asked   Other Topics Concern  . None   Social History Narrative  . None   No family history on file. Allergies  Allergen Reactions  . Contrast Media [Iodinated Diagnostic Agents] Itching and Nausea And Vomiting   Prior to Admission medications   Medication Sig Start Date End Date Taking? Authorizing Provider  lisinopril (PRINIVIL,ZESTRIL) 10 MG tablet Take 10 mg by mouth daily.   Yes Historical Provider, MD  VENTOLIN HFA 108 (90 Base) MCG/ACT inhaler Inhale 2 puffs into the lungs every 4 (four) hours as needed. 08/22/15  Yes Historical Provider, MD  cyclobenzaprine (FLEXERIL) 10 MG tablet Take 1 tablet (10 mg total) by mouth 3 (three) times daily as needed for muscle spasms. 10/01/15 10/01/15  Gregor Hams, MD  predniSONE (DELTASONE) 20 MG tablet Take 3 tablets (60 mg total) by mouth daily with breakfast. 10/01/15 10/05/15  Gregor Hams, MD   Ct Angio Chest Pe W/cm &/or Wo Cm  10/01/2015  CLINICAL DATA:  Shortness of breath over the last week. Elevated D-dimer. Left-sided  abdominal and pelvic pain. EXAM: CT ANGIOGRAPHY CHEST WITH CONTRAST TECHNIQUE: Multidetector CT imaging of the chest was performed using the standard protocol during bolus administration of intravenous contrast. Multiplanar CT image reconstructions and MIPs were obtained to evaluate the vascular anatomy. CONTRAST:  116mL OMNIPAQUE IOHEXOL 350 MG/ML SOLN COMPARISON:  Radiography same day FINDINGS: Pulmonary arterial opacification is good. There are no pulmonary emboli. There is mild atherosclerosis of the aorta but no evidence of aneurysm or dissection. There is mild coronary artery calcification. The lungs show a background pattern of emphysema, upper lobe predominant. There is dependent bronchopneumonia in both lower lobes. No pleural or pericardial fluid. No hilar or mediastinal mass or lymphadenopathy. Slight reactive nodal enlargement. No significant bone finding. Review of the MIP images confirms the above findings. IMPRESSION: No pulmonary emboli. Bilateral lower lobe bronchopneumonia. Background pattern of centrilobular emphysema. Electronically Signed   By: Nelson Chimes M.D.   On: 10/01/2015 09:44   Mr Lumbar Spine Wo Contrast  10/01/2015  CLINICAL DATA:  65 year old male with recent cough, fever and shortness of breath diagnosed with pneumonia. Associated back pain since coughing. Previous back surgery. Initial encounter. EXAM: MRI LUMBAR SPINE WITHOUT CONTRAST TECHNIQUE: Multiplanar, multisequence MR imaging of the lumbar spine was performed. No intravenous contrast was administered. COMPARISON:  CT Abdomen and Pelvis 08/21/2014 FINDINGS: Normal lumbar segmentation demonstrated in 2016. Stable vertebral height and alignment, including mild retrolisthesis at L5-S1. Degenerative endplate marrow  edema at L4-L5 and L5-S1, mild. No lumbar compression fracture or other acute osseous abnormality. Visible sacrum intact. Visualized lower thoracic spinal cord is normal with conus medularis at L1-L2. Visualized  abdominal viscera and paraspinal soft tissues are within normal limits. T12-L1:  Negative. L1-L2: Mild disc bulge. There is a subtle associated cephalad disc extrusion best seen on series 3, image 8, in this appears to extend toward the right L1 neural foramen (series 7, image 9) without definite foraminal stenosis. L2-L3: Mild disc bulge. Mild to moderate facet hypertrophy and mild ligament flavum hypertrophy. No stenosis. L3-L4: Mild disc bulge. Mild facet and ligament flavum hypertrophy. No stenosis. L4-L5: Disc desiccation and circumferential disc bulge with endplate spurring. Lobulated but broad-based posterior disc extrusion worse on the left and severely affecting the lateral recesses (descending L5 nerve root levels, greater on the left). Superimposed mild facet and ligament flavum hypertrophy. Some postoperative changes to the left lamina are noted. Overall there is moderate spinal stenosis with severe left greater than right lateral recess stenosis and up to mild left L4 foraminal stenosis. L5-S1: Disc desiccation and disc space loss. Circumferential disc osteophyte complex with evidence of previous left laminectomy and partial discectomy at the level of the left lateral recess. Mild left lateral recess architectural distortion. Mild facet hypertrophy. No spinal or lateral recess stenosis. There is mild bilateral L5 foraminal stenosis primarily due to endplate spurring. IMPRESSION: 1. Lobulated disc herniation at L4-L5 contributing to severe lateral recess stenosis (greater on the left) and moderate spinal stenosis at that level. Subtle postoperative changes there on the left. 2. Chronic disc and endplate degeneration at L5-S1, with left side postoperative changes and only mild bilateral L5 foraminal stenosis. 3. Subtle right side and cephalad disc extrusion suspected at L1-L2, might affect the exiting right L1 nerve. Electronically Signed   By: Genevie Ann M.D.   On: 10/01/2015 14:58   Ct Abdomen Pelvis W  Contrast  10/01/2015  CLINICAL DATA:  Left-sided abdominal and pelvic pain. EXAM: CT ABDOMEN AND PELVIS WITH CONTRAST TECHNIQUE: Multidetector CT imaging of the abdomen and pelvis was performed using the standard protocol following bolus administration of intravenous contrast. CONTRAST:  118mL OMNIPAQUE IOHEXOL 350 MG/ML SOLN COMPARISON:  08/21/2014 FINDINGS: The liver has a normal appearance without focal lesions or biliary ductal dilatation. No calcified gallstones. The spleen is normal. The pancreas is normal. The adrenal glands are normal. The kidneys are normal except for a sub cm cyst on the right. There is atherosclerosis of the aorta and its branch vessels but no aneurysm. The IVC is normal. No retroperitoneal mass or adenopathy. No free intraperitoneal fluid or air. No bowel pathology other than mild diverticulosis without evidence of diverticulitis. Previous anterior abdominal wall surgery. Bladder, prostate gland and seminal vesicles are unremarkable. Chronic degenerative changes at the L5-S1 disc level. IMPRESSION: No acute finding. No cause of left-sided pain identified. Mild diverticulosis without imaging evidence of diverticulitis. Atherosclerosis of the aorta and its branch vessels. Electronically Signed   By: Nelson Chimes M.D.   On: 10/01/2015 09:47   Dg Chest Portable 1 View  10/01/2015  CLINICAL DATA:  Low back pain and increased shortness of breath for 3 days. Decreased oxygen sats. EXAM: PORTABLE CHEST 1 VIEW COMPARISON:  09/24/2014 FINDINGS: Postoperative changes in the mediastinum. Normal heart size and pulmonary vascularity. Emphysematous changes and fibrosis in the lungs. No focal consolidation. No blunting of costophrenic angles. No pneumothorax. IMPRESSION: Emphysematous changes in the lungs. No evidence of active pulmonary disease. Electronically  Signed   By: Lucienne Capers M.D.   On: 10/01/2015 06:02    Positive ROS: All other systems have been reviewed and were otherwise  negative with the exception of those mentioned in the HPI and as above.  Physical Exam: General: Alert, no acute distress  MUSCULOSKELETAL: Patient is seen lying in bed. He complains of low back pain. He is having intermittent radicular symptoms. Currently he is not having significant numbness in his feet. Patient can perform straight leg raise approximately 30 with significant low back pain. Patient has palpable pedal pulses. He has intact sensation to light touch throughout the bilateral lower extremity. He has intact motor function throughout both lower extremities as well including quadriceps, hamstrings, anterior tibialis and gastrocsoleus and extensor hallucis longus.  Assessment: Low back pain with radicular symptoms secondary to herniated disc  Plan: Patient has no focal neurologic deficits on my exam today. However he is having low back pain with radicular symptoms. I reviewed the patient's MRI which was performed earlier today. The results are listed below:  1. Lobulated disc herniation at L4-L5 contributing to severe lateral recess stenosis (greater on the left) and moderate spinal stenosis at that level. Subtle postoperative changes there on the left.  2. Chronic disc and endplate degeneration at L5-S1, with left side postoperative changes and only mild bilateral L5 foraminal stenosis.  3. Subtle right side and cephalad disc extrusion suspected at L1-L2, might affect the exiting right L1 nerve.    Given the findings on MRI, the patient may be a candidate for corticosteroid injection.  I recommend contacting pain management for a consult to see if the patient could receive an injection acutely while an inpatient.  If not, the patient may need to be transferred to [facility] where they have spine specialist that could provide care for this patient.    [facility] does not have a spine specialist and I have nothing further offer this  patient.    Thornton Park, MD    10/01/2015 6:22 PM

## 2015-10-01 NOTE — Discharge Instructions (Signed)

## 2015-10-01 NOTE — ED Notes (Addendum)
Pt reports persistent pain to mid lower back radiating into legs; O2 sat 90% on 3l/min via [place]; MD notified and O2 increased to 5l/min to bring sat to 92%

## 2015-10-01 NOTE — ED Provider Notes (Signed)
CT abd IMPRESSION: No acute finding. No cause of left-sided pain identified. Mild diverticulosis without imaging evidence of diverticulitis.  Atherosclerosis of the aorta and its branch vessels. CT chest IMPRESSION: No pulmonary emboli.  Bilateral lower lobe bronchopneumonia. Background pattern of centrilobular emphysema.  Patient with pneumonia, hypoxia requiring oxygen, COPD, nonspecific abdominal pain. We'll recommend admission. Patient will receive IV antibiotics.  Earleen Newport, MD 10/01/15 [phone]

## 2015-10-01 NOTE — Progress Notes (Signed)
Paged [facility], advised of positive influenza A result, orders for tamiflu will follow.

## 2015-10-01 NOTE — ED Notes (Signed)
Pt assisted into hosp gown and on card monitor; appears uncomfortable, grimacing; st coughed on Monday and began having mid lower back pain radiating into legs that woke him PTA with increase in severity; st +smoker and hx lower lumbar surgery

## 2015-10-01 NOTE — ED Notes (Signed)
Pt arrives to ER via [facility] c/o lower back pain and increased SOB X 2-3 days. Pt received 142mcg of Fentanyl IV en route with EMS. Pt 89% on RA, placed on 2L [place] and 93% currently.

## 2015-10-01 NOTE — Progress Notes (Signed)
Patient arrived to unit this shift.  Complaining of pain in lower back radiating down into left leg.  MRI results show arthritis.  Dr. Tressia Miners holding phone for Dr. Benjie Karvonen, she called results to patient.  May consult ortho to evaluate as well.  PO narcotics managing pain level.  Pending flu testing.

## 2015-10-01 NOTE — Evaluation (Signed)
Physical Therapy Evaluation Patient Details Name: Jacob Perez MRN: [id number] DOB: Jul 14, 1951 Today's Date: 10/01/2015   History of Present Illness  Jacob Perez is a 65 y.o. male with a known history of asthma who presents with above complaint. Patient reports over the past 2-3 days he's had increasing shortness of breath, cough and fevers. Emergency room he was diagnosed with pneumonia and COPD exacerbation. He is also complaining of back pain. He says that since his been coughing he feels that he may have injured his lower back where he had previous surgery. He denies bowel or bladder incontinence. He has numbness at baseline of both of his feet. Jacob Perez denies falls in the last 12 months  Clinical Impression  Jacob Perez primarily limited by pain currently. He is able to perform bed mobility, transfers, and ambulation safely but is very guarded due to back pain. Jacob Perez able to complete full lap around RN station and SaO2>90% on room air throughout ambulation. Pending plan of care if no surgical intervention Jacob Perez would benefit from OP Jacob Perez for low back pain. Jacob Perez will benefit from skilled Jacob Perez services to address deficits in strength, balance, and mobility in order to return to full function at home.     Follow Up Recommendations Outpatient Jacob Perez;Other (comment) (OP Jacob Perez for back pain if no surgical intervention)    Equipment Recommendations  None recommended by Jacob Perez    Recommendations for Other Services       Precautions / Restrictions Precautions Precautions: Fall Restrictions Weight Bearing Restrictions: No      Mobility  Bed Mobility Overal bed mobility: Needs Assistance Bed Mobility: Supine to Sit     Supine to sit: Min assist     General bed mobility comments: MinA+1 due to low back pain. Jacob Perez takes extended time to perform with log rolling, HOB elevated, and useo f bed rails  Transfers Overall transfer level: Needs assistance Equipment used: Rolling walker (2 wheeled) Transfers: Sit to/from  Stand Sit to Stand: Min guard         General transfer comment: Jacob Perez initially with LE buckling "due to pain" per Jacob Perez report however able to self correct with UE support. Once upright takes a few minutes for pain to decrease but steady with UE support  Ambulation/Gait Ambulation/Gait assistance: Min guard Ambulation Distance (Feet): 250 Feet Assistive device: Rolling walker (2 wheeled) Gait Pattern/deviations: Decreased step length - right;Decreased step length - left Gait velocity: Decreased but functional for limited community ambulation   General Gait Details: Jacob Perez initially with guarded gait but speed and quality improve. Jacob Perez reports gradual decrease in low back pain with increased distance. Jacob Perez steady with use of rolling walker. SaO2>90% on room air during ambulation. Vitals remains WNL throughout ambulation  Stairs            Wheelchair Mobility    Modified Rankin (Stroke Patients Only)       Balance Overall balance assessment: Needs assistance Sitting-balance support: No upper extremity supported Sitting balance-Leahy Scale: Good     Standing balance support: No upper extremity supported Standing balance-Leahy Scale: Fair Standing balance comment: Jacob Perez with postitive Rhomberg after 3-5 seconds. Single leg stance 3-5 seconds. Able to maintain static narrow standing balance                             Pertinent Vitals/Pain Pain Assessment: 0-10 Pain Score: 0-No pain Pain Location: Denies back pain at rest but increases with ambulation. Does not rate Pain  Intervention(s): Monitored during session;Limited activity within patient's tolerance    Home Living Family/patient expects to be discharged to:: Private residence Living Arrangements: Spouse/significant other;Parent;Other relatives Available Help at Discharge: Family Type of Home: Mobile home Home Access: Stairs to enter Entrance Stairs-Rails: Right Entrance Stairs-Number of Steps: 5 Home Layout: One  level Home Equipment: Walker - 2 wheels;Cane - single point;Shower seat      Prior Function Level of Independence: Independent         Comments: Full community ambulator without assistive device. Drives     Hand Dominance   Dominant Hand: Right    Extremity/Trunk Assessment   Upper Extremity Assessment: Overall WFL for tasks assessed           Lower Extremity Assessment: Overall WFL for tasks assessed (difficult to assess due to LBP)         Communication   Communication: No difficulties  Cognition Arousal/Alertness: Awake/alert Behavior During Therapy: WFL for tasks assessed/performed Overall Cognitive Status: Within Functional Limits for tasks assessed                      General Comments      Exercises        Assessment/Plan    Jacob Perez Assessment Patient needs continued Jacob Perez services  Jacob Perez Diagnosis Difficulty walking;Abnormality of gait;Acute pain;Generalized weakness   Jacob Perez Problem List Decreased activity tolerance;Decreased balance;Decreased strength;Decreased mobility;Decreased safety awareness;Pain;Cardiopulmonary status limiting activity  Jacob Perez Treatment Interventions Gait training;Stair training;Therapeutic activities;Therapeutic exercise;Balance training;Neuromuscular re-education;Patient/family education;Manual techniques   Jacob Perez Goals (Current goals can be found in the Care Plan section) Acute Rehab Jacob Perez Goals Patient Stated Goal: Decrease back pain Jacob Perez Goal Formulation: With patient/family Time For Goal Achievement: 10/15/15 Potential to Achieve Goals: Good    Frequency Min 2X/week   Barriers to discharge Inaccessible home environment 5 stairs to enter    Co-evaluation               End of Session Equipment Utilized During Treatment: Gait belt Activity Tolerance: Patient tolerated treatment well Patient left: in chair;with call bell/phone within reach;with chair alarm set Nurse Communication: Mobility status         Time:  [id number] Jacob Perez Time Calculation (min) (ACUTE ONLY): 25 min   Charges:   Jacob Perez Evaluation $Jacob Perez Eval Low Complexity: 1 Procedure Jacob Perez Treatments $Gait Training: 8-22 mins   Jacob Perez G Codes:       Lyndel Safe Siara Gorder Jacob Perez, DPT    Makynlee Kressin 10/01/2015, 5:25 PM

## 2015-10-02 DIAGNOSIS — M5387 Other specified dorsopathies, lumbosacral region: Secondary | ICD-10-CM | POA: Diagnosis present

## 2015-10-02 DIAGNOSIS — R079 Chest pain, unspecified: Secondary | ICD-10-CM

## 2015-10-02 DIAGNOSIS — M545 Low back pain, unspecified: Secondary | ICD-10-CM | POA: Insufficient documentation

## 2015-10-02 DIAGNOSIS — M5136 Other intervertebral disc degeneration, lumbar region: Secondary | ICD-10-CM

## 2015-10-02 LAB — BASIC METABOLIC PANEL
ANION GAP: 10 (ref 5–15)
BUN: 21 mg/dL — ABNORMAL HIGH (ref 6–20)
CHLORIDE: 105 mmol/L (ref 101–111)
CO2: 22 mmol/L (ref 22–32)
Calcium: 8.8 mg/dL — ABNORMAL LOW (ref 8.9–10.3)
Creatinine, Ser: 0.96 mg/dL (ref 0.61–1.24)
GFR calc Af Amer: >60 mL/min (ref >60)
Glucose, Bld: 156 mg/dL — ABNORMAL HIGH (ref 65–99)
POTASSIUM: 3.8 mmol/L (ref 3.5–5.1)
SODIUM: 137 mmol/L (ref 135–145)

## 2015-10-02 LAB — CBC
HEMATOCRIT: 41.1 % (ref 40.0–52.0)
HEMOGLOBIN: 14.1 g/dL (ref 13.0–18.0)
MCH: 30.8 pg (ref 26.0–34.0)
MCHC: 34.3 g/dL (ref 32.0–36.0)
MCV: 89.8 fL (ref 80.0–100.0)
Platelets: 179 10*3/uL (ref 150–440)
RBC: 4.58 MIL/uL (ref 4.40–5.90)
RDW: 13.7 % (ref 11.5–14.5)
WBC: 19.1 10*3/uL — AB (ref 3.8–10.6)

## 2015-10-02 LAB — MAGNESIUM: Magnesium: 1.8 mg/dL (ref 1.7–2.4)

## 2015-10-02 LAB — EXPECTORATED SPUTUM ASSESSMENT W GRAM STAIN, RFLX TO RESP C

## 2015-10-02 LAB — TROPONIN I
TROPONIN I: 0.22 ng/mL — AB (ref <0.031)
TROPONIN I: 0.2 ng/mL — AB (ref <0.031)

## 2015-10-02 LAB — EXPECTORATED SPUTUM ASSESSMENT W REFEX TO RESP CULTURE

## 2015-10-02 LAB — PROCALCITONIN: PROCALCITONIN: 3.2 ng/mL

## 2015-10-02 MED ORDER — KETOROLAC TROMETHAMINE 30 MG/ML IJ SOLN
30.0000 mg | Freq: Three times a day (TID) | INTRAMUSCULAR | Status: AC
  Administered 2015-10-02 – 2015-10-03 (×5): 30 mg via INTRAVENOUS
  Filled 2015-10-02 (×4): qty 1
  Filled 2015-10-02: qty 5

## 2015-10-02 NOTE — Progress Notes (Signed)
Callback received from Dr.Vasilios Ottaway-informed of pt's trop level of 0.22 No new order received. Will con't to monitor the patient.

## 2015-10-02 NOTE — Progress Notes (Signed)
Waiting for Dr.Cipriano Millikan to callback. Pt's critical trop level of 0.22

## 2015-10-02 NOTE — Progress Notes (Signed)
Dr Posey Pronto paged regarding critical troponin level, waiting on callback.

## 2015-10-02 NOTE — Progress Notes (Signed)
Patient ID: Jacob Perez, male   DOB: 07/11/1951, 65 y.o.   MRN: [id number]   Subjective:  Patient ID: Jacob Perez, male    DOB: 09/12/1950  Age: 65 y.o. MRN: [id number]  CC: Back Pain and Shortness of Breath   HPI Jacob Perez is a pleasant white male 65 years of age that is inpatient with low back pain and left leg pain. In inpatient consultation has been requested for pain management regarding his low back pain. He has been experiencing pain that radiates from the left lower back into his left calf down into the foot with associated numbness and tingling affecting the area surrounding the left big toe. He also has some associated weakness in this area. He has blue and has been on IV steroids for pain management and also has been on pain medications to give symptomatic relief. At this point he's describing an VAS score of approximately 5-7 but can be a maximum of 10.  History Jacob Perez has a past medical history of Arthritis; Asthma; and Hypertension.   He has no past surgical history on file.   His family history is not on file.He reports that he has been smoking.  He does not have any smokeless tobacco history on file. He reports that he does not drink alcohol. His drug history is not on file.   ---------------------------------------------------------------------------------------------------------------------- Past Medical History  Diagnosis Date  . Arthritis   . Asthma   . Hypertension     History reviewed. No pertinent past surgical history.  No family history on file.  Social History  Substance Use Topics  . Smoking status: Current Every Day Smoker  . Smokeless tobacco: Not on file  . Alcohol Use: No    ---------------------------------------------------------------------------------------------------------------------- Social History   Social History  . Marital Status: Married    Spouse Name: N/A  . Number of Children: N/A  . Years of Education: N/A   Social History  Main Topics  . Smoking status: Current Every Day Smoker  . Smokeless tobacco: None  . Alcohol Use: No  . Drug Use: None  . Sexual Activity: Not Asked   Other Topics Concern  . None   Social History Narrative  . None      ----------------------------------------------------------------------------------------------------------------------  ROS Review of Systems   Objective:  BP 110/58 mmHg  Pulse 91  Temp(Src) 94.8 F (34.9 C) (Oral)  Resp 17  Ht 6' (1.829 m)  Wt 166 lb (75.297 kg)  BMI 22.51 kg/m2  SpO2 96%  Physical Exam   Patient is alert and oriented 3 cooperative compliant. He is recumbent in bed. He currently has a diagnosis of influenza B. Heart is regular rate and rhythm Lungs are clear to auscultation Inspection of the low back reveals some paraspinous muscle tenderness. He has a positive straight leg raise on the left side. He has diminished flexion and extension at the left great toe with diminished dorsiflexion at the left foot. Sensation is also diminished over the medial aspect of the left foot.     Assessment & Plan:   @ASSESSPLAN @   ----------------------------------------------------------------------------------------------------------------------  Problem List Items Addressed This Visit    None    Visit Diagnoses    Chronic obstructive pulmonary disease, unspecified COPD type ([place])    -  Primary    Relevant Medications    ipratropium-albuterol (DUONEB) 0.5-2.5 (3) MG/3ML nebulizer solution 3 mL (Completed)    ipratropium-albuterol (DUONEB) 0.5-2.5 (3) MG/3ML nebulizer solution 3 mL (Completed)    predniSONE (DELTASONE) 20 MG tablet  methylPREDNISolone sodium succinate (SOLU-MEDROL) 125 mg/2 mL injection 125 mg (Completed)    diphenhydrAMINE (BENADRYL) injection 50 mg (Completed)    hydrocortisone sodium succinate (SOLU-CORTEF) 100 MG injection 200 mg (Completed)    VENTOLIN HFA 108 (90 Base) MCG/ACT inhaler    methylPREDNISolone  sodium succinate (SOLU-MEDROL) 125 mg/2 mL injection 60 mg    tiotropium (SPIRIVA) inhalation capsule 18 mcg    albuterol (PROVENTIL) (2.5 MG/3ML) 0.083% nebulizer solution 2.5 mg    Bilateral low back pain with sciatica, sciatica laterality unspecified        Relevant Medications    morphine 4 MG/ML injection 4 mg (Completed)    predniSONE (DELTASONE) 20 MG tablet    HYDROmorphone (DILAUDID) injection 1 mg (Completed)    methylPREDNISolone sodium succinate (SOLU-MEDROL) 125 mg/2 mL injection 125 mg (Completed)    hydrocortisone sodium succinate (SOLU-CORTEF) 100 MG injection 200 mg (Completed)    acetaminophen (TYLENOL) tablet 650 mg    acetaminophen (TYLENOL) suppository 650 mg    HYDROcodone-acetaminophen (NORCO/VICODIN) 5-325 MG per tablet 1-2 tablet    methylPREDNISolone sodium succinate (SOLU-MEDROL) 125 mg/2 mL injection 60 mg    ketorolac (TORADOL) 30 MG/ML injection 30 mg (Start on 10/02/2015  2:00 PM)    Hypoxia        Community acquired pneumonia        Relevant Medications    ipratropium-albuterol (DUONEB) 0.5-2.5 (3) MG/3ML nebulizer solution 3 mL (Completed)    ipratropium-albuterol (DUONEB) 0.5-2.5 (3) MG/3ML nebulizer solution 3 mL (Completed)    diphenhydrAMINE (BENADRYL) injection 50 mg (Completed)    VENTOLIN HFA 108 (90 Base) MCG/ACT inhaler    levofloxacin (LEVAQUIN) IVPB 750 mg (Completed)    tiotropium (SPIRIVA) inhalation capsule 18 mcg    albuterol (PROVENTIL) (2.5 MG/3ML) 0.083% nebulizer solution 2.5 mg    levofloxacin (LEVAQUIN) tablet 500 mg    oseltamivir (TAMIFLU) capsule 75 mg    Compression fracture        Relevant Orders    MR Lumbar Spine Wo Contrast (Completed)       ----------------------------------------------------------------------------------------------------------------------  @DIAGMED @   ----------------------------------------------------------------------------------------------------------------------  @ENCMEDP @   Meds ordered  this encounter  Medications  . ipratropium-albuterol (DUONEB) 0.5-2.5 (3) MG/3ML nebulizer solution 3 mL    Sig:   . ipratropium-albuterol (DUONEB) 0.5-2.5 (3) MG/3ML nebulizer solution 3 mL    Sig:   . morphine 4 MG/ML injection 4 mg    Sig:   . ondansetron (ZOFRAN) injection 4 mg    Sig:   . cyclobenzaprine (FLEXERIL) 10 MG tablet    Sig: Take 1 tablet (10 mg total) by mouth 3 (three) times daily as needed for muscle spasms.    Dispense:  30 tablet    Refill:  1  . predniSONE (DELTASONE) 20 MG tablet    Sig: Take 3 tablets (60 mg total) by mouth daily with breakfast.    Dispense:  15 tablet    Refill:  0  . HYDROmorphone (DILAUDID) 1 MG/ML injection    Sig:     Mosetta Pigeon: cabinet override  . HYDROmorphone (DILAUDID) injection 1 mg    Sig:   . methylPREDNISolone sodium succinate (SOLU-MEDROL) 125 mg/2 mL injection 125 mg    Sig:   . DISCONTD: Barium Sulfate 2.1 % SUSP 450 mL    Sig:   . diphenhydrAMINE (BENADRYL) injection 50 mg    Sig:   . hydrocortisone sodium succinate (SOLU-CORTEF) 100 MG injection 200 mg    Sig:   . VENTOLIN HFA 108 (90 Base)  MCG/ACT inhaler    Sig: Inhale 2 puffs into the lungs every 4 (four) hours as needed.    Refill:  0  . lisinopril (PRINIVIL,ZESTRIL) 10 MG tablet    Sig: Take 10 mg by mouth daily.  . iohexol (OMNIPAQUE) 350 MG/ML injection 100 mL    Sig:   . levofloxacin (LEVAQUIN) IVPB 750 mg    Sig:     Order Specific Question:  Antibiotic Indication:    Answer:  CAP  . lisinopril (PRINIVIL,ZESTRIL) tablet 10 mg    Sig:   . DISCONTD: enoxaparin (LOVENOX) injection 40 mg    Sig:   . sodium chloride flush (NS) 0.9 % injection 3 mL    Sig:   . acetaminophen (TYLENOL) tablet 650 mg    Sig:    Or  . acetaminophen (TYLENOL) suppository 650 mg    Sig:   . HYDROcodone-acetaminophen (NORCO/VICODIN) 5-325 MG per tablet 1-2 tablet    Sig:   . senna-docusate (Senokot-S) tablet 1 tablet    Sig:   . ondansetron (ZOFRAN) tablet 4 mg     Sig:    Or  . ondansetron (ZOFRAN) injection 4 mg    Sig:   . alum & mag hydroxide-simeth (MAALOX/MYLANTA) [id number] MG/5ML suspension 30 mL    Sig:   . ALPRAZolam (XANAX) tablet 0.25 mg    Sig:   . methylPREDNISolone sodium succinate (SOLU-MEDROL) 125 mg/2 mL injection 60 mg    Sig:   . tiotropium (SPIRIVA) inhalation capsule 18 mcg    Sig:   . DISCONTD: levalbuterol (XOPENEX) nebulizer solution 1.25 mg    Sig:   . albuterol (PROVENTIL) (2.5 MG/3ML) 0.083% nebulizer solution 2.5 mg    Sig:   . pneumococcal 23 valent vaccine (PNU-IMMUNE) injection 0.5 mL    Sig:   . levofloxacin (LEVAQUIN) tablet 500 mg    Sig:   . oseltamivir (TAMIFLU) capsule 75 mg    Sig:   . ketorolac (TORADOL) 30 MG/ML injection 30 mg    Sig:        Follow-up: No Follow-up on file. we will plan on an outpatient epidural steroid injection in series as discussed with Dr. Posey Pronto today. This will allow [place] to further elucidate the etiology of his current infection and its treatment. Furthermore there is a cardiac workup that is pending like to see further evaluation regarding this. His plan has been discussed and accepted by the patient.   Molli Barrows, MD

## 2015-10-02 NOTE — Progress Notes (Signed)
Pt. Having runs of asystole. Dr. Estanislado Pandy ordered stat sodium and mag. Stat EKG ordered.

## 2015-10-02 NOTE — Progress Notes (Signed)
Subjective:  Occupation today. Patient reports pain as moderate.  Patient feels his pain is slightly improved compared to yesterday. He denies significant numbness or tingling in his legs currently.  Objective:   VITALS:   Filed Vitals:   10/02/15 0412 10/02/15 0417 10/02/15 0745 10/02/15 1104  BP: 104/58  116/54 110/58  Pulse: 81  79 91  Temp: 96.5 F (35.8 C)  96.2 F (35.7 C) 94.8 F (34.9 C)  TempSrc: Oral  Oral Oral  Resp: 18  17 17   Height:      Weight:      SpO2: 95% 95% 94% 96%    PHYSICAL EXAM: Patient has intact motor function throughout the bilateral lower extremities. He is alert oriented in no acute distress. He remains neurovascularly intact.   LABS  Results for orders placed or performed during the hospital encounter of 10/01/15 (from the past 24 hour(s))  Influenza panel by PCR (type A & B, H1N1)     Status: Abnormal   Collection Time: 10/01/15  4:38 PM  Result Value Ref Range   Influenza A By PCR POSITIVE (A) NEGATIVE   Influenza B By PCR NEGATIVE NEGATIVE   H1N1 flu by pcr NOT DETECTED NOT DETECTED  Basic metabolic panel     Status: Abnormal   Collection Time: 10/02/15  6:03 AM  Result Value Ref Range   Sodium 137 135 - 145 mmol/L   Potassium 3.8 3.5 - 5.1 mmol/L   Chloride 105 101 - 111 mmol/L   CO2 22 22 - 32 mmol/L   Glucose, Bld 156 (H) 65 - 99 mg/dL   BUN 21 (H) 6 - 20 mg/dL   Creatinine, Ser 0.96 0.61 - 1.24 mg/dL   Calcium 8.8 (L) 8.9 - 10.3 mg/dL   GFR calc non Af Amer >60 >60 mL/min   GFR calc Af Amer >60 >60 mL/min   Anion gap 10 5 - 15  CBC     Status: Abnormal   Collection Time: 10/02/15  6:03 AM  Result Value Ref Range   WBC 19.1 (H) 3.8 - 10.6 K/uL   RBC 4.58 4.40 - 5.90 MIL/uL   Hemoglobin 14.1 13.0 - 18.0 g/dL   HCT 41.1 40.0 - 52.0 %   MCV 89.8 80.0 - 100.0 fL   MCH 30.8 26.0 - 34.0 pg   MCHC 34.3 32.0 - 36.0 g/dL   RDW 13.7 11.5 - 14.5 %   Platelets 179 150 - 440 K/uL  Magnesium     Status: None   Collection Time:  10/02/15  6:05 AM  Result Value Ref Range   Magnesium 1.8 1.7 - 2.4 mg/dL  Troponin I (q 6hr x 3)     Status: Abnormal   Collection Time: 10/02/15  6:05 AM  Result Value Ref Range   Troponin I 0.22 (H) <0.031 ng/mL  Troponin I (q 6hr x 3)     Status: None   Collection Time: 10/02/15 10:59 AM  Result Value Ref Range   Troponin I <0.03 <0.031 ng/mL  Culture, expectorated sputum-assessment     Status: None   Collection Time: 10/02/15 12:29 PM  Result Value Ref Range   Specimen Description SPUTUM    Special Requests NONE    Sputum evaluation THIS SPECIMEN IS ACCEPTABLE FOR SPUTUM CULTURE    Report Status 10/02/2015 FINAL     Ct Angio Chest Pe W/cm &/or Wo Cm  10/01/2015  CLINICAL DATA:  Shortness of breath over the last week. Elevated D-dimer. Left-sided abdominal  and pelvic pain. EXAM: CT ANGIOGRAPHY CHEST WITH CONTRAST TECHNIQUE: Multidetector CT imaging of the chest was performed using the standard protocol during bolus administration of intravenous contrast. Multiplanar CT image reconstructions and MIPs were obtained to evaluate the vascular anatomy. CONTRAST:  151mL OMNIPAQUE IOHEXOL 350 MG/ML SOLN COMPARISON:  Radiography same day FINDINGS: Pulmonary arterial opacification is good. There are no pulmonary emboli. There is mild atherosclerosis of the aorta but no evidence of aneurysm or dissection. There is mild coronary artery calcification. The lungs show a background pattern of emphysema, upper lobe predominant. There is dependent bronchopneumonia in both lower lobes. No pleural or pericardial fluid. No hilar or mediastinal mass or lymphadenopathy. Slight reactive nodal enlargement. No significant bone finding. Review of the MIP images confirms the above findings. IMPRESSION: No pulmonary emboli. Bilateral lower lobe bronchopneumonia. Background pattern of centrilobular emphysema. Electronically Signed   By: Nelson Chimes M.D.   On: 10/01/2015 09:44   Mr Lumbar Spine Wo Contrast  10/01/2015   CLINICAL DATA:  65 year old male with recent cough, fever and shortness of breath diagnosed with pneumonia. Associated back pain since coughing. Previous back surgery. Initial encounter. EXAM: MRI LUMBAR SPINE WITHOUT CONTRAST TECHNIQUE: Multiplanar, multisequence MR imaging of the lumbar spine was performed. No intravenous contrast was administered. COMPARISON:  CT Abdomen and Pelvis 08/21/2014 FINDINGS: Normal lumbar segmentation demonstrated in 2016. Stable vertebral height and alignment, including mild retrolisthesis at L5-S1. Degenerative endplate marrow edema at L4-L5 and L5-S1, mild. No lumbar compression fracture or other acute osseous abnormality. Visible sacrum intact. Visualized lower thoracic spinal cord is normal with conus medularis at L1-L2. Visualized abdominal viscera and paraspinal soft tissues are within normal limits. T12-L1:  Negative. L1-L2: Mild disc bulge. There is a subtle associated cephalad disc extrusion best seen on series 3, image 8, in this appears to extend toward the right L1 neural foramen (series 7, image 9) without definite foraminal stenosis. L2-L3: Mild disc bulge. Mild to moderate facet hypertrophy and mild ligament flavum hypertrophy. No stenosis. L3-L4: Mild disc bulge. Mild facet and ligament flavum hypertrophy. No stenosis. L4-L5: Disc desiccation and circumferential disc bulge with endplate spurring. Lobulated but broad-based posterior disc extrusion worse on the left and severely affecting the lateral recesses (descending L5 nerve root levels, greater on the left). Superimposed mild facet and ligament flavum hypertrophy. Some postoperative changes to the left lamina are noted. Overall there is moderate spinal stenosis with severe left greater than right lateral recess stenosis and up to mild left L4 foraminal stenosis. L5-S1: Disc desiccation and disc space loss. Circumferential disc osteophyte complex with evidence of previous left laminectomy and partial discectomy at  the level of the left lateral recess. Mild left lateral recess architectural distortion. Mild facet hypertrophy. No spinal or lateral recess stenosis. There is mild bilateral L5 foraminal stenosis primarily due to endplate spurring. IMPRESSION: 1. Lobulated disc herniation at L4-L5 contributing to severe lateral recess stenosis (greater on the left) and moderate spinal stenosis at that level. Subtle postoperative changes there on the left. 2. Chronic disc and endplate degeneration at L5-S1, with left side postoperative changes and only mild bilateral L5 foraminal stenosis. 3. Subtle right side and cephalad disc extrusion suspected at L1-L2, might affect the exiting right L1 nerve. Electronically Signed   By: Genevie Ann M.D.   On: 10/01/2015 14:58   Ct Abdomen Pelvis W Contrast  10/01/2015  CLINICAL DATA:  Left-sided abdominal and pelvic pain. EXAM: CT ABDOMEN AND PELVIS WITH CONTRAST TECHNIQUE: Multidetector CT imaging of the  abdomen and pelvis was performed using the standard protocol following bolus administration of intravenous contrast. CONTRAST:  161mL OMNIPAQUE IOHEXOL 350 MG/ML SOLN COMPARISON:  08/21/2014 FINDINGS: The liver has a normal appearance without focal lesions or biliary ductal dilatation. No calcified gallstones. The spleen is normal. The pancreas is normal. The adrenal glands are normal. The kidneys are normal except for a sub cm cyst on the right. There is atherosclerosis of the aorta and its branch vessels but no aneurysm. The IVC is normal. No retroperitoneal mass or adenopathy. No free intraperitoneal fluid or air. No bowel pathology other than mild diverticulosis without evidence of diverticulitis. Previous anterior abdominal wall surgery. Bladder, prostate gland and seminal vesicles are unremarkable. Chronic degenerative changes at the L5-S1 disc level. IMPRESSION: No acute finding. No cause of left-sided pain identified. Mild diverticulosis without imaging evidence of diverticulitis.  Atherosclerosis of the aorta and its branch vessels. Electronically Signed   By: Nelson Chimes M.D.   On: 10/01/2015 09:47   Dg Chest Portable 1 View  10/01/2015  CLINICAL DATA:  Low back pain and increased shortness of breath for 3 days. Decreased oxygen sats. EXAM: PORTABLE CHEST 1 VIEW COMPARISON:  09/24/2014 FINDINGS: Postoperative changes in the mediastinum. Normal heart size and pulmonary vascularity. Emphysematous changes and fibrosis in the lungs. No focal consolidation. No blunting of costophrenic angles. No pneumothorax. IMPRESSION: Emphysematous changes in the lungs. No evidence of active pulmonary disease. Electronically Signed   By: Lucienne Capers M.D.   On: 10/01/2015 06:02    Assessment/Plan:     Active Problems:   COPD (chronic obstructive pulmonary disease) (HCC)   DDD (degenerative disc disease), lumbar   Sciatica of left side associated with disorder of lumbosacral spine  I discussed this patient with Dr. Vashti Hey from the pain management service today. Patient was seen by Dr. Andree Elk. Dr. ounces agreed to see the patient once he is recovered from his flu. He will see him in the office for possible corticosteroid injection for the herniated disc. Patient understood this plan and was in agreement. I will sign off for now.    Thornton Park , MD 10/02/2015, 2:47 PM

## 2015-10-02 NOTE — Progress Notes (Signed)
Prime doc paged about critical troponin level. Waiting on callback

## 2015-10-02 NOTE — Progress Notes (Signed)
[place] [facility]                                                                                                                                                                                            Patient Demographics   Jacob Perez, is a 65 y.o. male, DOB - May 24, 1951, [id number]  Admit date - 10/01/2015   Admitting Physician Bettey Costa, MD  Outpatient Primary MD for the patient is [name],[name], PA-C   LOS - 1  Subjective: Patient admitted with shortness of breath and back pain. His shortness of breath is improved. Back pain is improved as well. He has chronic back pain does exasperated with coughing. Patient also has a troponin that is slightly elevated.     Review of Systems:   CONSTITUTIONAL: No documented fever. No fatigue, weakness. No weight gain, no weight loss.  EYES: No blurry or double vision.  ENT: No tinnitus. No postnasal drip. No redness of the oropharynx.  RESPIRATORY: Positive productive cough, no wheeze, no hemoptysis. Positive dyspnea.  CARDIOVASCULAR: No chest pain. No orthopnea. No palpitations. No syncope.  GASTROINTESTINAL: No nausea, no vomiting or diarrhea. No abdominal pain. No melena or hematochezia.  GENITOURINARY: No dysuria or hematuria.  ENDOCRINE: No polyuria or nocturia. No heat or cold intolerance.  HEMATOLOGY: No anemia. No bruising. No bleeding.  INTEGUMENTARY: No rashes. No lesions.  MUSCULOSKELETAL: No arthritis. No swelling. No gout. Positive back pain NEUROLOGIC: No numbness, tingling, or ataxia. No seizure-type activity.  PSYCHIATRIC: No anxiety. No insomnia. No ADD.    Vitals:   Filed Vitals:   10/02/15 0412 10/02/15 0417 10/02/15 0745 10/02/15 1104  BP: 104/58  116/54 110/58  Pulse: 81  79 91  Temp: 96.5 F (35.8 C)  96.2 F (35.7 C) 94.8 F (34.9 C)  TempSrc: Oral  Oral Oral  Resp: 18  17 17   Height:      Weight:      SpO2: 95% 95% 94% 96%    Wt Readings from Last 3  Encounters:  10/01/15 75.297 kg (166 lb)     Intake/Output Summary (Last 24 hours) at 10/02/15 1341 Last data filed at 10/02/15 1000  Gross per 24 hour  Intake    240 ml  Output    600 ml  Net   -360 ml    Physical Exam:   GENERAL: Pleasant-appearing in no apparent distress.  HEAD, EYES, EARS, NOSE AND THROAT: Atraumatic, normocephalic. Extraocular muscles are intact. Pupils equal and reactive to light. Sclerae anicteric. No conjunctival injection. No oro-pharyngeal erythema.  NECK: Supple. There is no jugular venous distention. No  bruits, no lymphadenopathy, no thyromegaly.  HEART: Regular rate and rhythm,. No murmurs, no rubs, no clicks.  LUNGS: Bilateral wheezing without any rhonchi ABDOMEN: Soft, flat, nontender, nondistended. Has good bowel sounds. No hepatosplenomegaly appreciated.  EXTREMITIES: No evidence of any cyanosis, clubbing, or peripheral edema.  +2 pedal and radial pulses bilaterally.  NEUROLOGIC: The patient is alert, awake, and oriented x3 with no focal motor or sensory deficits appreciated bilaterally.  SKIN: Moist and warm with no rashes appreciated.  Psych: Not anxious, depressed LN: No inguinal LN enlargement    Antibiotics   Anti-infectives    Start     Dose/Rate Route Frequency Ordered Stop   10/02/15 1000  levofloxacin (LEVAQUIN) tablet 500 mg     500 mg Oral Daily 10/01/15 1508     10/01/15 1845  oseltamivir (TAMIFLU) capsule 75 mg     75 mg Oral 2 times daily 10/01/15 1839 10/06/15 2159   10/01/15 1015  levofloxacin (LEVAQUIN) IVPB 750 mg     750 mg 100 mL/hr over 90 Minutes Intravenous  Once 10/01/15 1002 10/01/15 1155      Medications   Scheduled Meds: . albuterol  2.5 mg Nebulization Q4H  . ketorolac  30 mg Intravenous 3 times per day  . levofloxacin  500 mg Oral Daily  . lisinopril  10 mg Oral Daily  . methylPREDNISolone (SOLU-MEDROL) injection  60 mg Intravenous 3 times per day  . oseltamivir  75 mg Oral BID  . pneumococcal 23 valent  vaccine  0.5 mL Intramuscular Tomorrow-1000  . sodium chloride flush  3 mL Intravenous Q12H  . tiotropium  18 mcg Inhalation Daily   Continuous Infusions:  PRN Meds:.acetaminophen **OR** acetaminophen, ALPRAZolam, alum & mag hydroxide-simeth, HYDROcodone-acetaminophen, ondansetron **OR** ondansetron (ZOFRAN) IV, senna-docusate   Data Review:   Micro Results No results found for this or any previous visit (from the past 240 hour(s)).  Radiology Reports Ct Angio Chest Pe W/cm &/or Wo Cm  10/01/2015  CLINICAL DATA:  Shortness of breath over the last week. Elevated D-dimer. Left-sided abdominal and pelvic pain. EXAM: CT ANGIOGRAPHY CHEST WITH CONTRAST TECHNIQUE: Multidetector CT imaging of the chest was performed using the standard protocol during bolus administration of intravenous contrast. Multiplanar CT image reconstructions and MIPs were obtained to evaluate the vascular anatomy. CONTRAST:  119mL OMNIPAQUE IOHEXOL 350 MG/ML SOLN COMPARISON:  Radiography same day FINDINGS: Pulmonary arterial opacification is good. There are no pulmonary emboli. There is mild atherosclerosis of the aorta but no evidence of aneurysm or dissection. There is mild coronary artery calcification. The lungs show a background pattern of emphysema, upper lobe predominant. There is dependent bronchopneumonia in both lower lobes. No pleural or pericardial fluid. No hilar or mediastinal mass or lymphadenopathy. Slight reactive nodal enlargement. No significant bone finding. Review of the MIP images confirms the above findings. IMPRESSION: No pulmonary emboli. Bilateral lower lobe bronchopneumonia. Background pattern of centrilobular emphysema. Electronically Signed   By: Nelson Chimes M.D.   On: 10/01/2015 09:44   Mr Lumbar Spine Wo Contrast  10/01/2015  CLINICAL DATA:  65 year old male with recent cough, fever and shortness of breath diagnosed with pneumonia. Associated back pain since coughing. Previous back surgery. Initial  encounter. EXAM: MRI LUMBAR SPINE WITHOUT CONTRAST TECHNIQUE: Multiplanar, multisequence MR imaging of the lumbar spine was performed. No intravenous contrast was administered. COMPARISON:  CT Abdomen and Pelvis 08/21/2014 FINDINGS: Normal lumbar segmentation demonstrated in 2016. Stable vertebral height and alignment, including mild retrolisthesis at L5-S1. Degenerative endplate marrow edema at  L4-L5 and L5-S1, mild. No lumbar compression fracture or other acute osseous abnormality. Visible sacrum intact. Visualized lower thoracic spinal cord is normal with conus medularis at L1-L2. Visualized abdominal viscera and paraspinal soft tissues are within normal limits. T12-L1:  Negative. L1-L2: Mild disc bulge. There is a subtle associated cephalad disc extrusion best seen on series 3, image 8, in this appears to extend toward the right L1 neural foramen (series 7, image 9) without definite foraminal stenosis. L2-L3: Mild disc bulge. Mild to moderate facet hypertrophy and mild ligament flavum hypertrophy. No stenosis. L3-L4: Mild disc bulge. Mild facet and ligament flavum hypertrophy. No stenosis. L4-L5: Disc desiccation and circumferential disc bulge with endplate spurring. Lobulated but broad-based posterior disc extrusion worse on the left and severely affecting the lateral recesses (descending L5 nerve root levels, greater on the left). Superimposed mild facet and ligament flavum hypertrophy. Some postoperative changes to the left lamina are noted. Overall there is moderate spinal stenosis with severe left greater than right lateral recess stenosis and up to mild left L4 foraminal stenosis. L5-S1: Disc desiccation and disc space loss. Circumferential disc osteophyte complex with evidence of previous left laminectomy and partial discectomy at the level of the left lateral recess. Mild left lateral recess architectural distortion. Mild facet hypertrophy. No spinal or lateral recess stenosis. There is mild bilateral L5  foraminal stenosis primarily due to endplate spurring. IMPRESSION: 1. Lobulated disc herniation at L4-L5 contributing to severe lateral recess stenosis (greater on the left) and moderate spinal stenosis at that level. Subtle postoperative changes there on the left. 2. Chronic disc and endplate degeneration at L5-S1, with left side postoperative changes and only mild bilateral L5 foraminal stenosis. 3. Subtle right side and cephalad disc extrusion suspected at L1-L2, might affect the exiting right L1 nerve. Electronically Signed   By: Genevie Ann M.D.   On: 10/01/2015 14:58   Ct Abdomen Pelvis W Contrast  10/01/2015  CLINICAL DATA:  Left-sided abdominal and pelvic pain. EXAM: CT ABDOMEN AND PELVIS WITH CONTRAST TECHNIQUE: Multidetector CT imaging of the abdomen and pelvis was performed using the standard protocol following bolus administration of intravenous contrast. CONTRAST:  124mL OMNIPAQUE IOHEXOL 350 MG/ML SOLN COMPARISON:  08/21/2014 FINDINGS: The liver has a normal appearance without focal lesions or biliary ductal dilatation. No calcified gallstones. The spleen is normal. The pancreas is normal. The adrenal glands are normal. The kidneys are normal except for a sub cm cyst on the right. There is atherosclerosis of the aorta and its branch vessels but no aneurysm. The IVC is normal. No retroperitoneal mass or adenopathy. No free intraperitoneal fluid or air. No bowel pathology other than mild diverticulosis without evidence of diverticulitis. Previous anterior abdominal wall surgery. Bladder, prostate gland and seminal vesicles are unremarkable. Chronic degenerative changes at the L5-S1 disc level. IMPRESSION: No acute finding. No cause of left-sided pain identified. Mild diverticulosis without imaging evidence of diverticulitis. Atherosclerosis of the aorta and its branch vessels. Electronically Signed   By: Nelson Chimes M.D.   On: 10/01/2015 09:47   Dg Chest Portable 1 View  10/01/2015  CLINICAL DATA:   Low back pain and increased shortness of breath for 3 days. Decreased oxygen sats. EXAM: PORTABLE CHEST 1 VIEW COMPARISON:  09/24/2014 FINDINGS: Postoperative changes in the mediastinum. Normal heart size and pulmonary vascularity. Emphysematous changes and fibrosis in the lungs. No focal consolidation. No blunting of costophrenic angles. No pneumothorax. IMPRESSION: Emphysematous changes in the lungs. No evidence of active pulmonary disease. Electronically Signed  By: Lucienne Capers M.D.   On: 10/01/2015 06:02     CBC  Recent Labs Lab 10/01/15 0535 10/02/15 0603  WBC 10.7* 19.1*  HGB 15.6 14.1  HCT 45.3 41.1  PLT 188 179  MCV 90.4 89.8  MCH 31.0 30.8  MCHC 34.3 34.3  RDW 13.8 13.7    Chemistries   Recent Labs Lab 10/01/15 0535 10/02/15 0603 10/02/15 0605  NA 140 137  --   K 3.8 3.8  --   CL 105 105  --   CO2 29 22  --   GLUCOSE 125* 156*  --   BUN 21* 21*  --   CREATININE 1.06 0.96  --   CALCIUM 8.7* 8.8*  --   MG  --   --  1.8   ------------------------------------------------------------------------------------------------------------------ estimated creatinine clearance is 85 mL/min (by C-G formula based on Cr of 0.96). ------------------------------------------------------------------------------------------------------------------ No results for input(s): HGBA1C in the last 72 hours. ------------------------------------------------------------------------------------------------------------------ No results for input(s): CHOL, HDL, LDLCALC, TRIG, CHOLHDL, LDLDIRECT in the last 72 hours. ------------------------------------------------------------------------------------------------------------------ No results for input(s): TSH, T4TOTAL, T3FREE, THYROIDAB in the last 72 hours.  Invalid input(s): FREET3 ------------------------------------------------------------------------------------------------------------------ No results for input(s): VITAMINB12, FOLATE,  FERRITIN, TIBC, IRON, RETICCTPCT in the last 72 hours.  Coagulation profile No results for input(s): INR, PROTIME in the last 168 hours.  No results for input(s): DDIMER in the last 72 hours.  Cardiac Enzymes  Recent Labs Lab 10/01/15 0535 10/02/15 0605 10/02/15 1059  TROPONINI <0.03 0.22* <0.03   ------------------------------------------------------------------------------------------------------------------ Invalid input(s): POCBNP    Assessment & Plan   65 year old male with history of essential hypertension and asthma who presented with shortness of breath and found to have pneumonia as well as mild COPD exacerbation.  1. Acute hypoxic respiratory failure: Due to combination of acute COPD exasperation as well as acute bronchitis. Chest x-ray without any evidence of pneumonia.  2. Acute bronchitis as well as the flu continue Levaquin and Tamiflu  3. Acute on chronic COPD exacerbation: Continue with IV steroids and Xopenex due to tachycardia. Add Spiriva inhaler to patient's regimen.  4. Back pain: After MRI with severe stenosis. Appears to be a chronic problem. He was seen by orthopedic and subsequently seen by anesthesiology due to his elevated WBC count and current illness today feel that he should wait to get a epidural. Patient should follow up outpatient with spine specialist for further evaluation..  5. Tobacco dependence: Patient is encouraged to stop smoking.  6. Essential hypertension: Continue lisinopril      Code Status Orders        Start     Ordered   10/01/15 1153  Full code   Continuous     10/01/15 1152    Code Status History    Date Active Date Inactive Code Status Order ID Comments User Context   This patient has a current code status but no historical code status.           Consults  orthopedics and anesthesiology DVT Prophylaxis  SCDs for now in case he needs any type of spinal injection  Lab Results  Component Value Date   PLT  179 10/02/2015     Time Spent in minutes   45 minutes Dustin Flock M.D on 10/02/2015 at 1:41 PM  Between 7am to 6pm - Pager - [phone]  After 6pm go to [email or web address] - password [email or web address] [facility]   Office  [phone]

## 2015-10-03 LAB — TROPONIN I: Troponin I: 0.03 ng/mL (ref <0.031)

## 2015-10-03 NOTE — Progress Notes (Signed)
Physical Therapy Treatment Patient Details Name: Jacob Perez MRN: [id number] DOB: 02-23-1951 Today's Date: 10/03/2015    History of Present Illness Jacob Perez is a 65 y.o. male with a known history of asthma who presents with above complaint. Patient reports over the past 2-3 days he's had increasing shortness of breath, cough and fevers. Emergency room he was diagnosed with pneumonia and COPD exacerbation. He is also complaining of back pain. He says that since his been coughing he feels that he may have injured his lower back where he had previous surgery. He denies bowel or bladder incontinence. He has numbness at baseline of both of his feet. Pt denies falls in the last 12 months    PT Comments    Patient with significant decrease in back pain this date, reporting at 0/10 throughout session (at rest and with mobility).  Able to complete all mobility without use of assist device, sup, without buckling or LOB; maintaining sats >95% on RA without difficulty (weaned per RN).  Left on RA end of session; RN informed/aware.   Follow Up Recommendations  Outpatient PT;Other (comment)     Equipment Recommendations  None recommended by PT    Recommendations for Other Services       Precautions / Restrictions Precautions Precautions: Fall Restrictions Weight Bearing Restrictions: No    Mobility  Bed Mobility Overal bed mobility: Independent                Transfers Overall transfer level: Needs assistance Equipment used: Rolling walker (2 wheeled);None Transfers: Sit to/from Stand Sit to Stand: Supervision         General transfer comment: with and without RW; no significant change in performance or safety  Ambulation/Gait Ambulation/Gait assistance: Supervision Ambulation Distance (Feet): 400 Feet     Gait velocity: 10' walk time, 5 seconds   General Gait Details: reciprocal stepping pattern with fair step height/length; fair/good trunk rotation and arm swing  (further enhanced with cuing for self-selected gait speed).  No buckling, LOB or safety concern.  No pain reported throughout   Stairs            Wheelchair Mobility    Modified Rankin (Stroke Patients Only)       Balance Overall balance assessment: Needs assistance Sitting-balance support: No upper extremity supported;Feet supported Sitting balance-Leahy Scale: Normal     Standing balance support: No upper extremity supported Standing balance-Leahy Scale: Fair                      Cognition Arousal/Alertness: Awake/alert Behavior During Therapy: WFL for tasks assessed/performed Overall Cognitive Status: Within Functional Limits for tasks assessed                      Exercises Other Exercises Other Exercises: Standing LE therex, 1x15, AROM for muscular strength/endurance: heel raises, mini squats, hip flex/ext/abduct/adduct.  Denies back pain throughout    General Comments        Pertinent Vitals/Pain Pain Assessment: No/denies pain    Home Living                      Prior Function            PT Goals (current goals can now be found in the care plan section) Acute Rehab PT Goals Patient Stated Goal: Decrease back pain PT Goal Formulation: With patient/family Time For Goal Achievement: 10/15/15 Potential to Achieve Goals: Good Progress towards PT goals: Progressing  toward goals    Frequency  Min 2X/week    PT Plan      Co-evaluation             End of Session Equipment Utilized During Treatment: Gait belt Activity Tolerance: Patient tolerated treatment well Patient left: in chair;with call bell/phone within reach;with chair alarm set     Time: [id number] PT Time Calculation (min) (ACUTE ONLY): 18 min  Charges:  $Gait Training: 8-22 mins                    G Codes:      Jacob Perez, PT, DPT, NCS 10/03/2015, 11:38 AM [phone]

## 2015-10-03 NOTE — Progress Notes (Signed)
ANTIBIOTIC CONSULT NOTE - FOLLOW UP DAY 3   Pharmacy Consult for Levofloxacin Indication: pneumonia  Allergies  Allergen Reactions  . Contrast Media [Iodinated Diagnostic Agents] Itching and Nausea And Vomiting    Patient Measurements: Height: 6' (182.9 cm) Weight: 166 lb (75.297 kg) IBW/kg (Calculated) : 77.6 Adjusted Body Weight:   Vital Signs: Temp: 96.4 F (35.8 C) (02/17 0803) Temp Source: Oral (02/17 0803) BP: 105/58 mmHg (02/17 0803) Pulse Rate: 79 (02/17 0803) Intake/Output from previous day: 02/16 0701 - 02/17 0700 In: -  Out: 600 [Urine:600] Intake/Output from this shift:    Labs:  Recent Labs  10/01/15 0535 10/02/15 0603  WBC 10.7* 19.1*  HGB 15.6 14.1  PLT 188 179  CREATININE 1.06 0.96   Estimated Creatinine Clearance: 85 mL/min (by C-G formula based on Cr of 0.96). No results for input(s): VANCOTROUGH, VANCOPEAK, VANCORANDOM, GENTTROUGH, GENTPEAK, GENTRANDOM, TOBRATROUGH, TOBRAPEAK, TOBRARND, AMIKACINPEAK, AMIKACINTROU, AMIKACIN in the last 72 hours.   Microbiology: Recent Results (from the past 720 hour(s))  Culture, expectorated sputum-assessment     Status: None   Collection Time: 10/02/15 12:29 PM  Result Value Ref Range Status   Specimen Description SPUTUM  Final   Special Requests NONE  Final   Sputum evaluation THIS SPECIMEN IS ACCEPTABLE FOR SPUTUM CULTURE  Final   Report Status 10/02/2015 FINAL  Final    Medical History: Past Medical History  Diagnosis Date  . Arthritis   . Asthma   . Hypertension     Medications:  Prescriptions prior to admission  Medication Sig Dispense Refill Last Dose  . lisinopril (PRINIVIL,ZESTRIL) 10 MG tablet Take 10 mg by mouth daily.   unknown at unknown  . VENTOLIN HFA 108 (90 Base) MCG/ACT inhaler Inhale 2 puffs into the lungs every 4 (four) hours as needed.  0 prn at prn   Scheduled:  . albuterol  2.5 mg Nebulization Q4H  . ketorolac  30 mg Intravenous 3 times per day  . levofloxacin  500 mg  Oral Daily  . lisinopril  10 mg Oral Daily  . methylPREDNISolone (SOLU-MEDROL) injection  60 mg Intravenous 3 times per day  . oseltamivir  75 mg Oral BID  . pneumococcal 23 valent vaccine  0.5 mL Intramuscular Tomorrow-1000  . sodium chloride flush  3 mL Intravenous Q12H  . tiotropium  18 mcg Inhalation Daily   Assessment: Pharmacy consulted to dose and monitor Levofloxacin in this 65 year old male. Patient is being treated for possible PNA and COPD exacerbation   Goal of Therapy:  Resolution of condition  Plan:  Continue Levofloxacin 500 mg PO q24 hours. Pharmacy to follow.   Nikol Lemar D 10/03/2015,8:53 AM

## 2015-10-03 NOTE — Progress Notes (Signed)
[place] [facility]                                                                                                                                                                                            Patient Demographics   Jacob Perez, is a 65 y.o. male, DOB - 09/16/1950, [id number]  Admit date - 10/01/2015   Admitting Physician Bettey Costa, MD  Outpatient Primary MD for the patient is [name],[name], PA-C   LOS - 2  Subjective: Feeling better, still has some back pain but much improved. Her numbness of breath improved    Review of Systems:   CONSTITUTIONAL: No documented fever. No fatigue, weakness. No weight gain, no weight loss.  EYES: No blurry or double vision.  ENT: No tinnitus. No postnasal drip. No redness of the oropharynx.  RESPIRATORY: Positive productive cough, no wheeze, no hemoptysis. Positive dyspnea.  CARDIOVASCULAR: No chest pain. No orthopnea. No palpitations. No syncope.  GASTROINTESTINAL: No nausea, no vomiting or diarrhea. No abdominal pain. No melena or hematochezia.  GENITOURINARY: No dysuria or hematuria.  ENDOCRINE: No polyuria or nocturia. No heat or cold intolerance.  HEMATOLOGY: No anemia. No bruising. No bleeding.  INTEGUMENTARY: No rashes. No lesions.  MUSCULOSKELETAL: No arthritis. No swelling. No gout. Positive back pain NEUROLOGIC: No numbness, tingling, or ataxia. No seizure-type activity.  PSYCHIATRIC: No anxiety. No insomnia. No ADD.    Vitals:   Filed Vitals:   10/03/15 0012 10/03/15 0030 10/03/15 0741 10/03/15 0803  BP:  122/68  105/58  Pulse:  76  79  Temp:  96.8 F (36 C)  96.4 F (35.8 C)  TempSrc:  Oral  Oral  Resp:  16  18  Height:      Weight:      SpO2: 96% 94% 95% 96%    Wt Readings from Last 3 Encounters:  10/01/15 75.297 kg (166 lb)     Intake/Output Summary (Last 24 hours) at 10/03/15 1143 Last data filed at 10/03/15 0915  Gross per 24 hour  Intake    120 ml   Output    300 ml  Net   -180 ml    Physical Exam:   GENERAL: Pleasant-appearing in no apparent distress.  HEAD, EYES, EARS, NOSE AND THROAT: Atraumatic, normocephalic. Extraocular muscles are intact. Pupils equal and reactive to light. Sclerae anicteric. No conjunctival injection. No oro-pharyngeal erythema.  NECK: Supple. There is no jugular venous distention. No bruits, no lymphadenopathy, no thyromegaly.  HEART: Regular rate and rhythm,. No murmurs, no rubs, no clicks.  LUNGS: decreased bs, no wheezing, no cracles ABDOMEN: Soft, flat,  nontender, nondistended. Has good bowel sounds. No hepatosplenomegaly appreciated.  EXTREMITIES: No evidence of any cyanosis, clubbing, or peripheral edema.  +2 pedal and radial pulses bilaterally.  NEUROLOGIC: The patient is alert, awake, and oriented x3 with no focal motor or sensory deficits appreciated bilaterally.  SKIN: Moist and warm with no rashes appreciated.  Psych: Not anxious, depressed LN: No inguinal LN enlargement    Antibiotics   Anti-infectives    Start     Dose/Rate Route Frequency Ordered Stop   10/02/15 1000  levofloxacin (LEVAQUIN) tablet 500 mg     500 mg Oral Daily 10/01/15 1508     10/01/15 1845  oseltamivir (TAMIFLU) capsule 75 mg     75 mg Oral 2 times daily 10/01/15 1839 10/06/15 2159   10/01/15 1015  levofloxacin (LEVAQUIN) IVPB 750 mg     750 mg 100 mL/hr over 90 Minutes Intravenous  Once 10/01/15 1002 10/01/15 1155      Medications   Scheduled Meds: . albuterol  2.5 mg Nebulization Q4H  . ketorolac  30 mg Intravenous 3 times per day  . levofloxacin  500 mg Oral Daily  . lisinopril  10 mg Oral Daily  . methylPREDNISolone (SOLU-MEDROL) injection  60 mg Intravenous 3 times per day  . oseltamivir  75 mg Oral BID  . sodium chloride flush  3 mL Intravenous Q12H  . tiotropium  18 mcg Inhalation Daily   Continuous Infusions:  PRN Meds:.acetaminophen **OR** acetaminophen, ALPRAZolam, alum & mag hydroxide-simeth,  HYDROcodone-acetaminophen, ondansetron **OR** ondansetron (ZOFRAN) IV, senna-docusate   Data Review:   Micro Results Recent Results (from the past 240 hour(s))  Culture, expectorated sputum-assessment     Status: None   Collection Time: 10/02/15 12:29 PM  Result Value Ref Range Status   Specimen Description SPUTUM  Final   Special Requests NONE  Final   Sputum evaluation THIS SPECIMEN IS ACCEPTABLE FOR SPUTUM CULTURE  Final   Report Status 10/02/2015 FINAL  Final  Culture, respiratory (NON-Expectorated)     Status: None (Preliminary result)   Collection Time: 10/02/15 12:29 PM  Result Value Ref Range Status   Specimen Description SPUTUM  Final   Special Requests NONE Reflexed from [id number]  Final   Gram Stain   Final    GOOD SPECIMEN - 80-90% WBCS FEW SQUAMOUS EPITHELIAL CELLS PRESENT MANY WBC SEEN MANY GRAM POSITIVE COCCI IN PAIRS IN CLUSTERS FEW YEAST MODERATE GRAM NEGATIVE COCCOBACILLI MODERATE GRAM VARIABLE ROD    Culture HOLDING FOR POSSIBLE PATHOGEN  Final   Report Status PENDING  Incomplete    Radiology Reports Ct Angio Chest Pe W/cm &/or Wo Cm  10/01/2015  CLINICAL DATA:  Shortness of breath over the last week. Elevated D-dimer. Left-sided abdominal and pelvic pain. EXAM: CT ANGIOGRAPHY CHEST WITH CONTRAST TECHNIQUE: Multidetector CT imaging of the chest was performed using the standard protocol during bolus administration of intravenous contrast. Multiplanar CT image reconstructions and MIPs were obtained to evaluate the vascular anatomy. CONTRAST:  180mL OMNIPAQUE IOHEXOL 350 MG/ML SOLN COMPARISON:  Radiography same day FINDINGS: Pulmonary arterial opacification is good. There are no pulmonary emboli. There is mild atherosclerosis of the aorta but no evidence of aneurysm or dissection. There is mild coronary artery calcification. The lungs show a background pattern of emphysema, upper lobe predominant. There is dependent bronchopneumonia in both lower lobes. No pleural or  pericardial fluid. No hilar or mediastinal mass or lymphadenopathy. Slight reactive nodal enlargement. No significant bone finding. Review of the MIP images confirms the above  findings. IMPRESSION: No pulmonary emboli. Bilateral lower lobe bronchopneumonia. Background pattern of centrilobular emphysema. Electronically Signed   By: Nelson Chimes M.D.   On: 10/01/2015 09:44   Mr Lumbar Spine Wo Contrast  10/01/2015  CLINICAL DATA:  65 year old male with recent cough, fever and shortness of breath diagnosed with pneumonia. Associated back pain since coughing. Previous back surgery. Initial encounter. EXAM: MRI LUMBAR SPINE WITHOUT CONTRAST TECHNIQUE: Multiplanar, multisequence MR imaging of the lumbar spine was performed. No intravenous contrast was administered. COMPARISON:  CT Abdomen and Pelvis 08/21/2014 FINDINGS: Normal lumbar segmentation demonstrated in 2016. Stable vertebral height and alignment, including mild retrolisthesis at L5-S1. Degenerative endplate marrow edema at L4-L5 and L5-S1, mild. No lumbar compression fracture or other acute osseous abnormality. Visible sacrum intact. Visualized lower thoracic spinal cord is normal with conus medularis at L1-L2. Visualized abdominal viscera and paraspinal soft tissues are within normal limits. T12-L1:  Negative. L1-L2: Mild disc bulge. There is a subtle associated cephalad disc extrusion best seen on series 3, image 8, in this appears to extend toward the right L1 neural foramen (series 7, image 9) without definite foraminal stenosis. L2-L3: Mild disc bulge. Mild to moderate facet hypertrophy and mild ligament flavum hypertrophy. No stenosis. L3-L4: Mild disc bulge. Mild facet and ligament flavum hypertrophy. No stenosis. L4-L5: Disc desiccation and circumferential disc bulge with endplate spurring. Lobulated but broad-based posterior disc extrusion worse on the left and severely affecting the lateral recesses (descending L5 nerve root levels, greater on the  left). Superimposed mild facet and ligament flavum hypertrophy. Some postoperative changes to the left lamina are noted. Overall there is moderate spinal stenosis with severe left greater than right lateral recess stenosis and up to mild left L4 foraminal stenosis. L5-S1: Disc desiccation and disc space loss. Circumferential disc osteophyte complex with evidence of previous left laminectomy and partial discectomy at the level of the left lateral recess. Mild left lateral recess architectural distortion. Mild facet hypertrophy. No spinal or lateral recess stenosis. There is mild bilateral L5 foraminal stenosis primarily due to endplate spurring. IMPRESSION: 1. Lobulated disc herniation at L4-L5 contributing to severe lateral recess stenosis (greater on the left) and moderate spinal stenosis at that level. Subtle postoperative changes there on the left. 2. Chronic disc and endplate degeneration at L5-S1, with left side postoperative changes and only mild bilateral L5 foraminal stenosis. 3. Subtle right side and cephalad disc extrusion suspected at L1-L2, might affect the exiting right L1 nerve. Electronically Signed   By: Genevie Ann M.D.   On: 10/01/2015 14:58   Ct Abdomen Pelvis W Contrast  10/01/2015  CLINICAL DATA:  Left-sided abdominal and pelvic pain. EXAM: CT ABDOMEN AND PELVIS WITH CONTRAST TECHNIQUE: Multidetector CT imaging of the abdomen and pelvis was performed using the standard protocol following bolus administration of intravenous contrast. CONTRAST:  136mL OMNIPAQUE IOHEXOL 350 MG/ML SOLN COMPARISON:  08/21/2014 FINDINGS: The liver has a normal appearance without focal lesions or biliary ductal dilatation. No calcified gallstones. The spleen is normal. The pancreas is normal. The adrenal glands are normal. The kidneys are normal except for a sub cm cyst on the right. There is atherosclerosis of the aorta and its branch vessels but no aneurysm. The IVC is normal. No retroperitoneal mass or adenopathy. No  free intraperitoneal fluid or air. No bowel pathology other than mild diverticulosis without evidence of diverticulitis. Previous anterior abdominal wall surgery. Bladder, prostate gland and seminal vesicles are unremarkable. Chronic degenerative changes at the L5-S1 disc level. IMPRESSION: No acute  finding. No cause of left-sided pain identified. Mild diverticulosis without imaging evidence of diverticulitis. Atherosclerosis of the aorta and its branch vessels. Electronically Signed   By: Nelson Chimes M.D.   On: 10/01/2015 09:47   Dg Chest Portable 1 View  10/01/2015  CLINICAL DATA:  Low back pain and increased shortness of breath for 3 days. Decreased oxygen sats. EXAM: PORTABLE CHEST 1 VIEW COMPARISON:  09/24/2014 FINDINGS: Postoperative changes in the mediastinum. Normal heart size and pulmonary vascularity. Emphysematous changes and fibrosis in the lungs. No focal consolidation. No blunting of costophrenic angles. No pneumothorax. IMPRESSION: Emphysematous changes in the lungs. No evidence of active pulmonary disease. Electronically Signed   By: Lucienne Capers M.D.   On: 10/01/2015 06:02     CBC  Recent Labs Lab 10/01/15 0535 10/02/15 0603  WBC 10.7* 19.1*  HGB 15.6 14.1  HCT 45.3 41.1  PLT 188 179  MCV 90.4 89.8  MCH 31.0 30.8  MCHC 34.3 34.3  RDW 13.8 13.7    Chemistries   Recent Labs Lab 10/01/15 0535 10/02/15 0603 10/02/15 0605  NA 140 137  --   K 3.8 3.8  --   CL 105 105  --   CO2 29 22  --   GLUCOSE 125* 156*  --   BUN 21* 21*  --   CREATININE 1.06 0.96  --   CALCIUM 8.7* 8.8*  --   MG  --   --  1.8   ------------------------------------------------------------------------------------------------------------------ estimated creatinine clearance is 85 mL/min (by C-G formula based on Cr of 0.96). ------------------------------------------------------------------------------------------------------------------ No results for input(s): HGBA1C in the last 72  hours. ------------------------------------------------------------------------------------------------------------------ No results for input(s): CHOL, HDL, LDLCALC, TRIG, CHOLHDL, LDLDIRECT in the last 72 hours. ------------------------------------------------------------------------------------------------------------------ No results for input(s): TSH, T4TOTAL, T3FREE, THYROIDAB in the last 72 hours.  Invalid input(s): FREET3 ------------------------------------------------------------------------------------------------------------------ No results for input(s): VITAMINB12, FOLATE, FERRITIN, TIBC, IRON, RETICCTPCT in the last 72 hours.  Coagulation profile No results for input(s): INR, PROTIME in the last 168 hours.  No results for input(s): DDIMER in the last 72 hours.  Cardiac Enzymes  Recent Labs Lab 10/02/15 1659 10/02/15 2300 10/03/15 0510  TROPONINI 0.20* <0.03 0.03   ------------------------------------------------------------------------------------------------------------------ Invalid input(s): POCBNP    Assessment & Plan   65 year old male with history of essential hypertension and asthma who presented with shortness of breath and found to have pneumonia as well as mild COPD exacerbation.  1. Acute hypoxic respiratory failure: Due to combination of acute COPD exasperation as well as acute bronchitis. Continue oral Levaquin  2. Acute bronchitis as well as the flu continue Levaquin and Tamiflu  3. Acute on chronic COPD exacerbation: Continue with IV steroids and an albuterol nebulizer Continue Spiriva   4. Back pain:  MRI with severe stenosis. Appears to be a chronic problem. He was seen by orthopedic and subsequently seen by anesthesiology due to his elevated WBC count and current illness patient will be followed up outpatient for lumbar spinal injection   5. Tobacco dependence: Patient is encouraged to stop smoking.  6. Essential hypertension: Continue  lisinopril      Code Status Orders        Start     Ordered   10/01/15 1153  Full code   Continuous     10/01/15 1152    Code Status History    Date Active Date Inactive Code Status Order ID Comments User Context   This patient has a current code status but no historical code status.  Consults  orthopedics and anesthesiology DVT Prophylaxis  SCDs for now in case he needs any type of spinal injection  Lab Results  Component Value Date   PLT 179 10/02/2015     Time Spent in minutes   35 minutes Dustin Flock M.D on 10/03/2015 at 11:43 AM  Between 7am to 6pm - Pager - [phone]  After 6pm go to [email or web address] - password [email or web address] [facility]   Office  [phone]

## 2015-10-03 NOTE — Progress Notes (Signed)
MD gave order to discontinue telemetry

## 2015-10-03 NOTE — Consult Note (Signed)
[facility]  CARDIOLOGY CONSULT NOTE  Patient ID: Jacob Perez MRN: [id number] DOB/AGE: [birth year] 65 y.o.  Admit date: 10/01/2015 Referring Physician Dr. Posey Pronto Primary Physician   Primary Cardiologist   Reason for Consultation Chest pain  HPI: Pt is a 65 yo male with no prior cardiac problems admitted with lower back pain and sob. He had several days of sob, cough and fevers. He was given dx of pna and copd exacerbation in the er. CXR revealed emphysematous changes with no active pulmonary disease. He had chest pain yesterday with normal ekg with borderline but inconsistent troponin levels bouncing between normal and 0.2 on the same day.  He was positive for influenza A. He has no further chest pain today. Pain was transient and in his epigastric region lasting several minutes. Telemetry reported episodes of pauses which were asymtpomatic.   ROS Review of Systems - History obtained from chart review and the patient General ROS: positive for  - fatigue and fever Respiratory ROS: positive for - cough, pleuritic pain and shortness of breath Cardiovascular ROS: positive for - chest pain and shortness of breath Gastrointestinal ROS: positive for - abdominal pain Neurological ROS: no TIA or stroke symptoms   Past Medical History  Diagnosis Date  . Arthritis   . Asthma   . Hypertension     No family history on file.  Social History   Social History  . Marital Status: Married    Spouse Name: N/A  . Number of Children: N/A  . Years of Education: N/A   Occupational History  . Not on file.   Social History Main Topics  . Smoking status: Current Every Day Smoker  . Smokeless tobacco: Not on file  . Alcohol Use: No  . Drug Use: Not on file  . Sexual Activity: Not on file   Other Topics Concern  . Not on file   Social History Narrative  . No narrative on file    History reviewed. No pertinent past surgical history.   Prescriptions prior  to admission  Medication Sig Dispense Refill Last Dose  . lisinopril (PRINIVIL,ZESTRIL) 10 MG tablet Take 10 mg by mouth daily.   unknown at unknown  . VENTOLIN HFA 108 (90 Base) MCG/ACT inhaler Inhale 2 puffs into the lungs every 4 (four) hours as needed.  0 prn at prn    Physical Exam: Blood pressure 122/68, pulse 76, temperature 96.8 F (36 C), temperature source Oral, resp. rate 16, height 6' (1.829 m), weight 75.297 kg (166 lb), SpO2 94 %.    General appearance: alert and cooperative Resp: rhonchi bilaterally Chest wall: no tenderness Cardio: regular rate and rhythm GI: soft, non-tender; bowel sounds normal; no masses,  no organomegaly Extremities: extremities normal, atraumatic, no cyanosis or edema Neurologic: Grossly normal Labs:   Lab Results  Component Value Date   WBC 19.1* 10/02/2015   HGB 14.1 10/02/2015   HCT 41.1 10/02/2015   MCV 89.8 10/02/2015   PLT 179 10/02/2015    Recent Labs Lab 10/02/15 0603  NA 137  K 3.8  CL 105  CO2 22  BUN 21*  CREATININE 0.96  CALCIUM 8.8*  GLUCOSE 156*   Lab Results  Component Value Date   CKTOTAL 52 02/25/2008   CKMB 1.2 02/25/2008   TROPONINI 0.03 10/03/2015      Radiology: no acute airspace diseas EKG: sr with no ischemia  ASSESSMENT AND PLAN:  Pt with admission for sob noted to have positive influenza  virus. Had chest pain atypical for angina. Troponin levels were very incosistant with levels of 0.3,0.22,0.3 on the same day within hours of each other. Does not appear to have had any signficant acs or cardiac injruy. Would continue to treat the influenza. Will follow telemetry.  Signed: Teodoro Spray MD, [facility] 10/03/2015, 7:29 AM

## 2015-10-04 LAB — CULTURE, RESPIRATORY: CULTURE: NORMAL

## 2015-10-04 LAB — CULTURE, RESPIRATORY W GRAM STAIN

## 2015-10-04 LAB — PROCALCITONIN: Procalcitonin: 0.69 ng/mL

## 2015-10-04 MED ORDER — MAGNESIUM HYDROXIDE 400 MG/5ML PO SUSP
30.0000 mL | Freq: Every day | ORAL | Status: DC | PRN
  Administered 2015-10-04: 30 mL via ORAL
  Filled 2015-10-04: qty 30

## 2015-10-04 MED ORDER — METHYLPREDNISOLONE SODIUM SUCC 125 MG IJ SOLR
60.0000 mg | INTRAMUSCULAR | Status: DC
  Administered 2015-10-05: 60 mg via INTRAVENOUS
  Filled 2015-10-04: qty 2

## 2015-10-04 MED ORDER — BISACODYL 10 MG RE SUPP
10.0000 mg | Freq: Every day | RECTAL | Status: DC | PRN
  Administered 2015-10-04: 10 mg via RECTAL
  Filled 2015-10-04: qty 1

## 2015-10-04 MED ORDER — ALBUTEROL SULFATE (2.5 MG/3ML) 0.083% IN NEBU: 2.5000 mg | INHALATION_SOLUTION | RESPIRATORY_TRACT | Status: DC | PRN

## 2015-10-04 NOTE — Plan of Care (Signed)
Problem: Pain Managment: Goal: General experience of comfort will improve Outcome: Progressing No complaints of pain this shift   

## 2015-10-04 NOTE — Progress Notes (Signed)
   Subjective:    Patient ID: Jacob Perez, male    DOB: 1951-06-13, 65 y.o.   MRN: [id number]  Back Pain This is a recurrent problem. The problem has been gradually improving since onset. The pain is present in the lumbar spine. The quality of the pain is described as cramping. The pain radiates to the left foot. The pain is at a severity of 5/10. The pain is moderate. The symptoms are aggravated by position.  Shortness of Breath      Review of Systems  Respiratory: Positive for shortness of breath.   Musculoskeletal: Positive for back pain.       Objective:   Physical Exam Pain gradually improving with diminished radicular sx.         Assessment & Plan:  LLE sciatica:  Cont PT and plan outpatient followup

## 2015-10-04 NOTE — Progress Notes (Signed)
[facility]                                                                                                                                                                                            Patient Demographics   Jacob Perez, is a 65 y.o. male, DOB - 08-26-1950, [id number]  Admit date - 10/01/2015   Admitting Physician Bettey Costa, MD  Outpatient Primary MD for the patient is [name],[name], PA-C   LOS - 3  Subjective: Patient says he feels better. No shortness of breath, no back pain at this time. Less wheezing.  Review of Systems:   CONSTITUTIONAL: No documented fever. No fatigue, weakness. No weight gain, no weight loss.  EYES: No blurry or double vision.  ENT: No tinnitus. No postnasal drip. No redness of the oropharynx.  RESPIRATORY: Positive productive cough, no wheeze, no hemoptysis. Positive dyspnea.  CARDIOVASCULAR: No chest pain. No orthopnea. No palpitations. No syncope.  GASTROINTESTINAL: No nausea, no vomiting or diarrhea. No abdominal pain. No melena or hematochezia.  GENITOURINARY: No dysuria or hematuria.  ENDOCRINE: No polyuria or nocturia. No heat or cold intolerance.  HEMATOLOGY: No anemia. No bruising. No bleeding.  INTEGUMENTARY: No rashes. No lesions.  MUSCULOSKELETAL: No arthritis. No swelling. No gout. Positive back pain NEUROLOGIC: No numbness, tingling, or ataxia. No seizure-type activity.  PSYCHIATRIC: No anxiety. No insomnia. No ADD.    Vitals:   Filed Vitals:   10/03/15 1900 10/03/15 2334 10/04/15 0316 10/04/15 0801  BP: 110/60  115/60 120/74  Pulse: 76  79 75  Temp: 97.2 F (36.2 C)  96.3 F (35.7 C) 97 F (36.1 C)  TempSrc: Oral  Oral Oral  Resp: 18  18 17   Height:      Weight:      SpO2: 93% 95% 93% 93%    Wt Readings from Last 3 Encounters:  10/01/15 75.297 kg (166 lb)     Intake/Output Summary (Last 24 hours) at 10/04/15 0814 Last data filed at 10/04/15 0802  Gross per  24 hour  Intake    120 ml  Output    850 ml  Net   -730 ml    Physical Exam:   GENERAL: Pleasant-appearing in no apparent distress.  HEAD, EYES, EARS, NOSE AND THROAT: Atraumatic, normocephalic. Extraocular muscles are intact. Pupils equal and reactive to light. Sclerae anicteric. No conjunctival injection. No oro-pharyngeal erythema.  NECK: Supple. There is no jugular venous distention. No bruits, no lymphadenopathy, no thyromegaly.  HEART: Regular rate and rhythm,. No murmurs, no rubs, no clicks.  LUNGS: decreased bs, no wheezing, no cracles  ABDOMEN: Soft, flat, nontender, nondistended. Has good bowel sounds. No hepatosplenomegaly appreciated.  EXTREMITIES: No evidence of any cyanosis, clubbing, or peripheral edema.  +2 pedal and radial pulses bilaterally.  NEUROLOGIC: The patient is alert, awake, and oriented x3 with no focal motor or sensory deficits appreciated bilaterally.  SKIN: Moist and warm with no rashes appreciated.  Psych: Not anxious, depressed LN: No inguinal LN enlargement    Antibiotics   Anti-infectives    Start     Dose/Rate Route Frequency Ordered Stop   10/02/15 1000  levofloxacin (LEVAQUIN) tablet 500 mg     500 mg Oral Daily 10/01/15 1508     10/01/15 1845  oseltamivir (TAMIFLU) capsule 75 mg     75 mg Oral 2 times daily 10/01/15 1839 10/06/15 2159   10/01/15 1015  levofloxacin (LEVAQUIN) IVPB 750 mg     750 mg 100 mL/hr over 90 Minutes Intravenous  Once 10/01/15 1002 10/01/15 1155      Medications   Scheduled Meds: . levofloxacin  500 mg Oral Daily  . lisinopril  10 mg Oral Daily  . methylPREDNISolone (SOLU-MEDROL) injection  60 mg Intravenous 3 times per day  . oseltamivir  75 mg Oral BID  . sodium chloride flush  3 mL Intravenous Q12H  . tiotropium  18 mcg Inhalation Daily   Continuous Infusions:  PRN Meds:.acetaminophen **OR** acetaminophen, albuterol, ALPRAZolam, alum & mag hydroxide-simeth, HYDROcodone-acetaminophen, ondansetron **OR**  ondansetron (ZOFRAN) IV, senna-docusate   Data Review:   Micro Results Recent Results (from the past 240 hour(s))  Culture, expectorated sputum-assessment     Status: None   Collection Time: 10/02/15 12:29 PM  Result Value Ref Range Status   Specimen Description SPUTUM  Final   Special Requests NONE  Final   Sputum evaluation THIS SPECIMEN IS ACCEPTABLE FOR SPUTUM CULTURE  Final   Report Status 10/02/2015 FINAL  Final  Culture, respiratory (NON-Expectorated)     Status: None (Preliminary result)   Collection Time: 10/02/15 12:29 PM  Result Value Ref Range Status   Specimen Description SPUTUM  Final   Special Requests NONE Reflexed from [id number]  Final   Gram Stain   Final    GOOD SPECIMEN - 80-90% WBCS FEW SQUAMOUS EPITHELIAL CELLS PRESENT MANY WBC SEEN MANY GRAM POSITIVE COCCI IN PAIRS IN CLUSTERS FEW YEAST MODERATE GRAM NEGATIVE COCCOBACILLI MODERATE GRAM VARIABLE ROD    Culture HOLDING FOR POSSIBLE PATHOGEN  Final   Report Status PENDING  Incomplete    Radiology Reports Ct Angio Chest Pe W/cm &/or Wo Cm  10/01/2015  CLINICAL DATA:  Shortness of breath over the last week. Elevated D-dimer. Left-sided abdominal and pelvic pain. EXAM: CT ANGIOGRAPHY CHEST WITH CONTRAST TECHNIQUE: Multidetector CT imaging of the chest was performed using the standard protocol during bolus administration of intravenous contrast. Multiplanar CT image reconstructions and MIPs were obtained to evaluate the vascular anatomy. CONTRAST:  119mL OMNIPAQUE IOHEXOL 350 MG/ML SOLN COMPARISON:  Radiography same day FINDINGS: Pulmonary arterial opacification is good. There are no pulmonary emboli. There is mild atherosclerosis of the aorta but no evidence of aneurysm or dissection. There is mild coronary artery calcification. The lungs show a background pattern of emphysema, upper lobe predominant. There is dependent bronchopneumonia in both lower lobes. No pleural or pericardial fluid. No hilar or mediastinal mass  or lymphadenopathy. Slight reactive nodal enlargement. No significant bone finding. Review of the MIP images confirms the above findings. IMPRESSION: No pulmonary emboli. Bilateral lower lobe bronchopneumonia. Background pattern of centrilobular emphysema. Electronically  Signed   By: Nelson Chimes M.D.   On: 10/01/2015 09:44   Mr Lumbar Spine Wo Contrast  10/01/2015  CLINICAL DATA:  65 year old male with recent cough, fever and shortness of breath diagnosed with pneumonia. Associated back pain since coughing. Previous back surgery. Initial encounter. EXAM: MRI LUMBAR SPINE WITHOUT CONTRAST TECHNIQUE: Multiplanar, multisequence MR imaging of the lumbar spine was performed. No intravenous contrast was administered. COMPARISON:  CT Abdomen and Pelvis 08/21/2014 FINDINGS: Normal lumbar segmentation demonstrated in 2016. Stable vertebral height and alignment, including mild retrolisthesis at L5-S1. Degenerative endplate marrow edema at L4-L5 and L5-S1, mild. No lumbar compression fracture or other acute osseous abnormality. Visible sacrum intact. Visualized lower thoracic spinal cord is normal with conus medularis at L1-L2. Visualized abdominal viscera and paraspinal soft tissues are within normal limits. T12-L1:  Negative. L1-L2: Mild disc bulge. There is a subtle associated cephalad disc extrusion best seen on series 3, image 8, in this appears to extend toward the right L1 neural foramen (series 7, image 9) without definite foraminal stenosis. L2-L3: Mild disc bulge. Mild to moderate facet hypertrophy and mild ligament flavum hypertrophy. No stenosis. L3-L4: Mild disc bulge. Mild facet and ligament flavum hypertrophy. No stenosis. L4-L5: Disc desiccation and circumferential disc bulge with endplate spurring. Lobulated but broad-based posterior disc extrusion worse on the left and severely affecting the lateral recesses (descending L5 nerve root levels, greater on the left). Superimposed mild facet and ligament  flavum hypertrophy. Some postoperative changes to the left lamina are noted. Overall there is moderate spinal stenosis with severe left greater than right lateral recess stenosis and up to mild left L4 foraminal stenosis. L5-S1: Disc desiccation and disc space loss. Circumferential disc osteophyte complex with evidence of previous left laminectomy and partial discectomy at the level of the left lateral recess. Mild left lateral recess architectural distortion. Mild facet hypertrophy. No spinal or lateral recess stenosis. There is mild bilateral L5 foraminal stenosis primarily due to endplate spurring. IMPRESSION: 1. Lobulated disc herniation at L4-L5 contributing to severe lateral recess stenosis (greater on the left) and moderate spinal stenosis at that level. Subtle postoperative changes there on the left. 2. Chronic disc and endplate degeneration at L5-S1, with left side postoperative changes and only mild bilateral L5 foraminal stenosis. 3. Subtle right side and cephalad disc extrusion suspected at L1-L2, might affect the exiting right L1 nerve. Electronically Signed   By: Genevie Ann M.D.   On: 10/01/2015 14:58   Ct Abdomen Pelvis W Contrast  10/01/2015  CLINICAL DATA:  Left-sided abdominal and pelvic pain. EXAM: CT ABDOMEN AND PELVIS WITH CONTRAST TECHNIQUE: Multidetector CT imaging of the abdomen and pelvis was performed using the standard protocol following bolus administration of intravenous contrast. CONTRAST:  178mL OMNIPAQUE IOHEXOL 350 MG/ML SOLN COMPARISON:  08/21/2014 FINDINGS: The liver has a normal appearance without focal lesions or biliary ductal dilatation. No calcified gallstones. The spleen is normal. The pancreas is normal. The adrenal glands are normal. The kidneys are normal except for a sub cm cyst on the right. There is atherosclerosis of the aorta and its branch vessels but no aneurysm. The IVC is normal. No retroperitoneal mass or adenopathy. No free intraperitoneal fluid or air. No bowel  pathology other than mild diverticulosis without evidence of diverticulitis. Previous anterior abdominal wall surgery. Bladder, prostate gland and seminal vesicles are unremarkable. Chronic degenerative changes at the L5-S1 disc level. IMPRESSION: No acute finding. No cause of left-sided pain identified. Mild diverticulosis without imaging evidence of diverticulitis. Atherosclerosis  of the aorta and its branch vessels. Electronically Signed   By: Nelson Chimes M.D.   On: 10/01/2015 09:47   Dg Chest Portable 1 View  10/01/2015  CLINICAL DATA:  Low back pain and increased shortness of breath for 3 days. Decreased oxygen sats. EXAM: PORTABLE CHEST 1 VIEW COMPARISON:  09/24/2014 FINDINGS: Postoperative changes in the mediastinum. Normal heart size and pulmonary vascularity. Emphysematous changes and fibrosis in the lungs. No focal consolidation. No blunting of costophrenic angles. No pneumothorax. IMPRESSION: Emphysematous changes in the lungs. No evidence of active pulmonary disease. Electronically Signed   By: Lucienne Capers M.D.   On: 10/01/2015 06:02     CBC  Recent Labs Lab 10/01/15 0535 10/02/15 0603  WBC 10.7* 19.1*  HGB 15.6 14.1  HCT 45.3 41.1  PLT 188 179  MCV 90.4 89.8  MCH 31.0 30.8  MCHC 34.3 34.3  RDW 13.8 13.7    Chemistries   Recent Labs Lab 10/01/15 0535 10/02/15 0603 10/02/15 0605  NA 140 137  --   K 3.8 3.8  --   CL 105 105  --   CO2 29 22  --   GLUCOSE 125* 156*  --   BUN 21* 21*  --   CREATININE 1.06 0.96  --   CALCIUM 8.7* 8.8*  --   MG  --   --  1.8   ------------------------------------------------------------------------------------------------------------------ estimated creatinine clearance is 85 mL/min (by C-G formula based on Cr of 0.96). ------------------------------------------------------------------------------------------------------------------ No results for input(s): HGBA1C in the last 72  hours. ------------------------------------------------------------------------------------------------------------------ No results for input(s): CHOL, HDL, LDLCALC, TRIG, CHOLHDL, LDLDIRECT in the last 72 hours. ------------------------------------------------------------------------------------------------------------------ No results for input(s): TSH, T4TOTAL, T3FREE, THYROIDAB in the last 72 hours.  Invalid input(s): FREET3 ------------------------------------------------------------------------------------------------------------------ No results for input(s): VITAMINB12, FOLATE, FERRITIN, TIBC, IRON, RETICCTPCT in the last 72 hours.  Coagulation profile No results for input(s): INR, PROTIME in the last 168 hours.  No results for input(s): DDIMER in the last 72 hours.  Cardiac Enzymes  Recent Labs Lab 10/02/15 1659 10/02/15 2300 10/03/15 0510  TROPONINI 0.20* <0.03 0.03   ------------------------------------------------------------------------------------------------------------------ Invalid input(s): POCBNP    Assessment & Plan   65 year old male with history of essential hypertension and asthma who presented with shortness of breath and found to have pneumonia as well as mild COPD exacerbation.  1. Acute hypoxic respiratory failure: Due to combination of acute COPD exasperation and bilateral lower lobe pneumonia. Continue oxygen, Levaquin, steroids.now on RA,sats 93% 2. Influenza;on tamiflu and Isolation;)=droplet 3. Acute on chronic COPD exacerbation: Clinically better.. Decrease IV dose of steroid. Continue Spiriva, albuterol. 4. Back pain:  MRI with severe stenosis. Appears to be a chronic problem. He was seen by orthopedic and subsequently seen by anesthesiology due to his elevated WBC count and current illness patient will be followed up outpatient for lumbar spinal injection   5. Tobacco dependence: Patient is encouraged to stop smoking.  6. Essential  hypertension: Continue lisinopril      Code Status Orders        Start     Ordered   10/01/15 1153  Full code   Continuous     10/01/15 1152    Code Status History    Date Active Date Inactive Code Status Order ID Comments User Context   This patient has a current code status but no historical code status.           Consults  orthopedics and anesthesiology DVT Prophylaxis  SCDs for now in case he  needs any type of spinal injection  Lab Results  Component Value Date   PLT 179 10/02/2015     Time Spent in minutes   28 minutes Latessa Tillis M.D on 10/04/2015 at 8:14 AM  Between 7am to 6pm - Pager - [phone]  After 6pm go to [email or web address] - password [email or web address] [facility]   Office  [phone]

## 2015-10-04 NOTE — Progress Notes (Signed)
   Subjective:    Patient ID: Jacob Perez, male    DOB: [birth year], 65 y.o.   MRN: [id number]  Back Pain This is a recurrent problem. The problem has been gradually improving since onset. The quality of the pain is described as aching. Associated symptoms include tingling and weakness.  Shortness of Breath      Review of Systems  Respiratory: Positive for shortness of breath.   Musculoskeletal: Positive for back pain.  Neurological: Positive for tingling and weakness.       Objective:   Physical Exam Decreased pain with ext at the Left leg.  Ambulating today with less pain        Assessment & Plan:  LLE scaitica and back pain are improving.  Recommend cont. Same with outpatient epidural steroids and continued management.  Dr. Andree Elk

## 2015-10-05 MED ORDER — HYDROCODONE-ACETAMINOPHEN 5-325 MG PO TABS: 1.0000 | ORAL_TABLET | ORAL | Status: DC | PRN

## 2015-10-05 MED ORDER — LEVOFLOXACIN 500 MG PO TABS: 500.0000 mg | ORAL_TABLET | Freq: Every day | ORAL | Status: DC

## 2015-10-05 MED ORDER — PREDNISONE 10 MG (21) PO TBPK: 10.0000 mg | ORAL_TABLET | Freq: Every day | ORAL | Status: DC

## 2015-10-05 MED ORDER — VENTOLIN HFA 108 (90 BASE) MCG/ACT IN AERS: 2.0000 | INHALATION_SPRAY | RESPIRATORY_TRACT | Status: DC | PRN

## 2015-10-05 MED ORDER — TIOTROPIUM BROMIDE MONOHYDRATE 18 MCG IN CAPS: 18.0000 ug | ORAL_CAPSULE | Freq: Every day | RESPIRATORY_TRACT | Status: DC

## 2015-10-05 NOTE — Discharge Summary (Signed)
Jacob Perez, is a 65 y.o. male  DOB 1951-04-08  MRN [id number].  Admission date:  10/01/2015  Admitting Physician  Bettey Costa, MD  Discharge Date:  10/05/2015   Primary MD  Fae Pippin  Recommendations for primary care physician for things to follow:   Follow-up with primary doctor  Admission Diagnosis  Community acquired pneumonia [J18.9] Hypoxia [R09.02] Compression fracture [T14.8] Bilateral low back pain with sciatica, sciatica laterality unspecified [M54.41, M54.42] Chronic obstructive pulmonary disease, unspecified COPD type ([place]) [J44.9]   Discharge Diagnosis  Community acquired pneumonia [J18.9] Hypoxia [R09.02] Compression fracture [T14.8] Bilateral low back pain with sciatica, sciatica laterality unspecified [M54.41, M54.42] Chronic obstructive pulmonary disease, unspecified COPD type ([place]) [J44.9]    Active Problems:   COPD (chronic obstructive pulmonary disease) (HCC)   DDD (degenerative disc disease), lumbar   Sciatica of left side associated with disorder of lumbosacral spine   Low back pain      Past Medical History  Diagnosis Date  . Arthritis   . Asthma   . Hypertension     History reviewed. No pertinent past surgical history.     History of present illness and  Hospital Course:     Kindly see H&P for history of present illness and admission details, please review complete Labs, Consult reports and Test reports for all details in brief  HPI  from the history and physical done on the day of admission 64 year old male patient with admitted for shortness of breath, cough, fever found to have a pneumonia and COPD exacerbation admitted for the same.   Hospital Course  #1 acute hypoxic respiratory failure secondary to pneumonia and COPD exacerbation: Patient started on IV Levaquin,  oxygen, Solu-Medrol. Initially required 4 L of oxygen but now he is on room air saturating around 94%. CTof the chest did not show PE but did show bilateral bronchopneumonia and emphysema. Advised to quit smoking. Started on Spiriva inhaler. Gave prescription for tapering dose of steroids, I advised him to continue albuterol inhaler every 4 hours. 2 days and then changed to every 8 hours. Discharge home with Levaquin for 5 days. #2 low back pain secondary to left-sided sciatica: Had radicular pain in the lumbar spine. Seen by presence K from orthopedic and also Dr. Andree Elk from anesthesia. Patient is given Flexeril, norco prescription. Patient will follow up with Dr. Andree Elk  For epidural  steroid injection. #3. Influenza type A;: Patient received Tamiflu for 5 days. Essential hypertension: Controlled. Continue lisinopril.  Discharge Condition: stable   Follow UP  Follow-up Information    Follow up with MENZ,MICHAEL, MD.   Specialty:  Orthopedic Surgery   Contact information:   [place] [address] [phone]         Discharge Instructions  and  Discharge Medications  Low sodium diet      Medication List    TAKE these medications        HYDROcodone-acetaminophen 5-325 MG tablet  Commonly known as:  NORCO/VICODIN  Take 1-2 tablets by mouth every 4 (four) hours as needed for moderate pain.     levofloxacin 500 MG tablet  Commonly known as:  LEVAQUIN  Take 1 tablet (500 mg total) by mouth daily.     lisinopril 10 MG tablet  Commonly known as:  PRINIVIL,ZESTRIL  Take 10 mg by mouth daily.     predniSONE 10 MG (21) Tbpk tablet  Commonly known as:  STERAPRED UNI-PAK 21 TAB  Take 1  tablet (10 mg total) by mouth daily. 4  Daily for 3 days 3 daily for 3 days 2 daily for 3 days 1 daily for 2 days     tiotropium 18 MCG inhalation capsule  Commonly known as:  SPIRIVA  Place 1 capsule (18 mcg total) into inhaler and inhale daily.      VENTOLIN HFA 108 (90 Base) MCG/ACT inhaler  Generic drug:  albuterol  Inhale 2 puffs into the lungs every 4 (four) hours as needed.          Diet and Activity recommendation: See Discharge Instructions above   Consults obtained -ortho and anesthesia   Major procedures and Radiology Reports - PLEASE review detailed and final reports for all details, in brief -      Ct Angio Chest Pe W/cm &/or Wo Cm  10/01/2015  CLINICAL DATA:  Shortness of breath over the last week. Elevated D-dimer. Left-sided abdominal and pelvic pain. EXAM: CT ANGIOGRAPHY CHEST WITH CONTRAST TECHNIQUE: Multidetector CT imaging of the chest was performed using the standard protocol during bolus administration of intravenous contrast. Multiplanar CT image reconstructions and MIPs were obtained to evaluate the vascular anatomy. CONTRAST:  157mL OMNIPAQUE IOHEXOL 350 MG/ML SOLN COMPARISON:  Radiography same day FINDINGS: Pulmonary arterial opacification is good. There are no pulmonary emboli. There is mild atherosclerosis of the aorta but no evidence of aneurysm or dissection. There is mild coronary artery calcification. The lungs show a background pattern of emphysema, upper lobe predominant. There is dependent bronchopneumonia in both lower lobes. No pleural or pericardial fluid. No hilar or mediastinal mass or lymphadenopathy. Slight reactive nodal enlargement. No significant bone finding. Review of the MIP images confirms the above findings. IMPRESSION: No pulmonary emboli. Bilateral lower lobe bronchopneumonia. Background pattern of centrilobular emphysema. Electronically Signed   By: Nelson Chimes M.D.   On: 10/01/2015 09:44   Mr Lumbar Spine Wo Contrast  10/01/2015  CLINICAL DATA:  65 year old male with recent cough, fever and shortness of breath diagnosed with pneumonia. Associated back pain since coughing. Previous back surgery. Initial encounter. EXAM: MRI LUMBAR SPINE WITHOUT CONTRAST TECHNIQUE: Multiplanar,  multisequence MR imaging of the lumbar spine was performed. No intravenous contrast was administered. COMPARISON:  CT Abdomen and Pelvis 08/21/2014 FINDINGS: Normal lumbar segmentation demonstrated in 2016. Stable vertebral height and alignment, including mild retrolisthesis at L5-S1. Degenerative endplate marrow edema at L4-L5 and L5-S1, mild. No lumbar compression fracture or other acute osseous abnormality. Visible sacrum intact. Visualized lower thoracic spinal cord is normal with conus medularis at L1-L2. Visualized abdominal viscera and paraspinal soft tissues are within normal limits. T12-L1:  Negative. L1-L2: Mild disc bulge. There is a subtle associated cephalad disc extrusion best seen on series 3, image 8, in this appears to extend toward the right L1 neural foramen (series 7, image 9) without definite foraminal stenosis. L2-L3: Mild disc bulge. Mild to moderate facet hypertrophy and mild ligament flavum hypertrophy. No stenosis. L3-L4: Mild disc bulge. Mild facet and ligament flavum hypertrophy. No stenosis. L4-L5: Disc desiccation and circumferential disc bulge with endplate spurring. Lobulated but broad-based posterior disc extrusion worse on the left and severely affecting the lateral recesses (descending L5 nerve root levels, greater on the left). Superimposed mild facet and ligament flavum hypertrophy. Some postoperative changes to the left lamina are noted. Overall there is moderate spinal stenosis with severe left greater than right lateral recess stenosis and up to mild left L4 foraminal stenosis. L5-S1: Disc desiccation and disc space loss. Circumferential disc osteophyte complex  with evidence of previous left laminectomy and partial discectomy at the level of the left lateral recess. Mild left lateral recess architectural distortion. Mild facet hypertrophy. No spinal or lateral recess stenosis. There is mild bilateral L5 foraminal stenosis primarily due to endplate spurring. IMPRESSION: 1.  Lobulated disc herniation at L4-L5 contributing to severe lateral recess stenosis (greater on the left) and moderate spinal stenosis at that level. Subtle postoperative changes there on the left. 2. Chronic disc and endplate degeneration at L5-S1, with left side postoperative changes and only mild bilateral L5 foraminal stenosis. 3. Subtle right side and cephalad disc extrusion suspected at L1-L2, might affect the exiting right L1 nerve. Electronically Signed   By: Genevie Ann M.D.   On: 10/01/2015 14:58   Ct Abdomen Pelvis W Contrast  10/01/2015  CLINICAL DATA:  Left-sided abdominal and pelvic pain. EXAM: CT ABDOMEN AND PELVIS WITH CONTRAST TECHNIQUE: Multidetector CT imaging of the abdomen and pelvis was performed using the standard protocol following bolus administration of intravenous contrast. CONTRAST:  137mL OMNIPAQUE IOHEXOL 350 MG/ML SOLN COMPARISON:  08/21/2014 FINDINGS: The liver has a normal appearance without focal lesions or biliary ductal dilatation. No calcified gallstones. The spleen is normal. The pancreas is normal. The adrenal glands are normal. The kidneys are normal except for a sub cm cyst on the right. There is atherosclerosis of the aorta and its branch vessels but no aneurysm. The IVC is normal. No retroperitoneal mass or adenopathy. No free intraperitoneal fluid or air. No bowel pathology other than mild diverticulosis without evidence of diverticulitis. Previous anterior abdominal wall surgery. Bladder, prostate gland and seminal vesicles are unremarkable. Chronic degenerative changes at the L5-S1 disc level. IMPRESSION: No acute finding. No cause of left-sided pain identified. Mild diverticulosis without imaging evidence of diverticulitis. Atherosclerosis of the aorta and its branch vessels. Electronically Signed   By: Nelson Chimes M.D.   On: 10/01/2015 09:47   Dg Chest Portable 1 View  10/01/2015  CLINICAL DATA:  Low back pain and increased shortness of breath for 3 days. Decreased  oxygen sats. EXAM: PORTABLE CHEST 1 VIEW COMPARISON:  09/24/2014 FINDINGS: Postoperative changes in the mediastinum. Normal heart size and pulmonary vascularity. Emphysematous changes and fibrosis in the lungs. No focal consolidation. No blunting of costophrenic angles. No pneumothorax. IMPRESSION: Emphysematous changes in the lungs. No evidence of active pulmonary disease. Electronically Signed   By: Lucienne Capers M.D.   On: 10/01/2015 06:02    Micro Results     Recent Results (from the past 240 hour(s))  Culture, expectorated sputum-assessment     Status: None   Collection Time: 10/02/15 12:29 PM  Result Value Ref Range Status   Specimen Description SPUTUM  Final   Special Requests NONE  Final   Sputum evaluation THIS SPECIMEN IS ACCEPTABLE FOR SPUTUM CULTURE  Final   Report Status 10/02/2015 FINAL  Final  Culture, respiratory (NON-Expectorated)     Status: None   Collection Time: 10/02/15 12:29 PM  Result Value Ref Range Status   Specimen Description SPUTUM  Final   Special Requests NONE Reflexed from [id number]  Final   Gram Stain   Final    GOOD SPECIMEN - 80-90% WBCS FEW SQUAMOUS EPITHELIAL CELLS PRESENT MANY WBC SEEN MANY GRAM POSITIVE COCCI IN PAIRS IN CLUSTERS FEW YEAST MODERATE GRAM NEGATIVE COCCOBACILLI MODERATE GRAM VARIABLE ROD    Culture Consistent with normal respiratory flora.  Final   Report Status 10/04/2015 FINAL  Final       Today  Subjective:   Trygg Hopf today has no headache,no chest abdominal pain,no new weakness tingling or numbness, feels much better wants to go home today.   Objective:   Blood pressure 120/71, pulse 66, temperature 98.3 F (36.8 C), temperature source Oral, resp. rate 18, height 6' (1.829 m), weight 75.297 kg (166 lb), SpO2 94 %.   Intake/Output Summary (Last 24 hours) at 10/05/15 0802 Last data filed at 10/05/15 0000  Gross per 24 hour  Intake    123 ml  Output      0 ml  Net    123 ml    Exam Awake Alert, Oriented  x 3, No new F.N deficits, Normal affect [place].AT,PERRAL Supple Neck,No JVD, No cervical lymphadenopathy appriciated.  Symmetrical Chest wall movement, Good air movement bilaterally, CTAB RRR,No Gallops,Rubs or new Murmurs, No Parasternal Heave +ve B.Sounds, Abd Soft, Non tender, No organomegaly appriciated, No rebound -guarding or rigidity. No Cyanosis, Clubbing or edema, No new Rash or bruise  Data Review   CBC w Diff: Lab Results  Component Value Date   WBC 19.1* 10/02/2015   WBC 9.2 08/21/2014   HGB 14.1 10/02/2015   HGB 15.6 08/21/2014   HCT 41.1 10/02/2015   HCT 46.2 08/21/2014   PLT 179 10/02/2015   PLT 255 08/21/2014   LYMPHOPCT 45 02/26/2008   MONOPCT 5 02/26/2008   EOSPCT 3 02/26/2008   BASOPCT 0 02/26/2008    CMP: Lab Results  Component Value Date   NA 137 10/02/2015   NA 139 08/21/2014   K 3.8 10/02/2015   K 4.1 08/21/2014   CL 105 10/02/2015   CL 106 08/21/2014   CO2 22 10/02/2015   CO2 28 08/21/2014   BUN 21* 10/02/2015   BUN 14 08/21/2014   CREATININE 0.96 10/02/2015   CREATININE 1.01 08/21/2014   PROT 7.7 08/21/2014   PROT 6.4 02/26/2008   ALBUMIN 3.7 08/21/2014   ALBUMIN 3.7 02/26/2008   BILITOT 0.5 08/21/2014   BILITOT 1.1 02/26/2008   ALKPHOS 67 08/21/2014   ALKPHOS 49 02/26/2008   AST 18 08/21/2014   AST 15 02/26/2008   ALT 18 08/21/2014   ALT 10 02/26/2008  .   Total Time in preparing paper work, data evaluation and todays exam - 78 minutes  Aziya Arena M.D on 10/05/2015 at 8:02 AM    Note: This dictation was prepared with Dragon dictation along with smaller phrase technology. Any transcriptional errors that result from this process are unintentional.

## 2015-10-08 LAB — MISC LABCORP TEST (SEND OUT): LABCORP TEST CODE: 9985

## 2015-12-12 DIAGNOSIS — Z1389 Encounter for screening for other disorder: Secondary | ICD-10-CM | POA: Diagnosis not present

## 2015-12-12 DIAGNOSIS — Z23 Encounter for immunization: Secondary | ICD-10-CM | POA: Diagnosis not present

## 2015-12-12 DIAGNOSIS — F17200 Nicotine dependence, unspecified, uncomplicated: Secondary | ICD-10-CM | POA: Diagnosis not present

## 2015-12-12 DIAGNOSIS — N390 Urinary tract infection, site not specified: Secondary | ICD-10-CM | POA: Diagnosis not present

## 2015-12-12 DIAGNOSIS — J449 Chronic obstructive pulmonary disease, unspecified: Secondary | ICD-10-CM | POA: Diagnosis not present

## 2015-12-12 DIAGNOSIS — I480 Paroxysmal atrial fibrillation: Secondary | ICD-10-CM | POA: Diagnosis not present

## 2015-12-18 DIAGNOSIS — I480 Paroxysmal atrial fibrillation: Secondary | ICD-10-CM | POA: Diagnosis not present

## 2015-12-18 DIAGNOSIS — N390 Urinary tract infection, site not specified: Secondary | ICD-10-CM | POA: Diagnosis not present

## 2016-09-05 ENCOUNTER — Emergency Department
Admission: EM | Admit: 2016-09-05 | Discharge: 2016-09-05 | Disposition: A | Discharge status: Home / Self Care | Payer: BLUE CROSS/BLUE SHIELD | Attending: Emergency Medicine | Admitting: Emergency Medicine

## 2016-09-05 ENCOUNTER — Emergency Department: Payer: BLUE CROSS/BLUE SHIELD

## 2016-09-05 DIAGNOSIS — J449 Chronic obstructive pulmonary disease, unspecified: Secondary | ICD-10-CM | POA: Diagnosis not present

## 2016-09-05 DIAGNOSIS — R0602 Shortness of breath: Secondary | ICD-10-CM | POA: Diagnosis not present

## 2016-09-05 DIAGNOSIS — R079 Chest pain, unspecified: Secondary | ICD-10-CM | POA: Diagnosis not present

## 2016-09-05 DIAGNOSIS — R05 Cough: Secondary | ICD-10-CM | POA: Diagnosis present

## 2016-09-05 DIAGNOSIS — F17200 Nicotine dependence, unspecified, uncomplicated: Secondary | ICD-10-CM | POA: Diagnosis not present

## 2016-09-05 DIAGNOSIS — Z79899 Other long term (current) drug therapy: Secondary | ICD-10-CM | POA: Diagnosis not present

## 2016-09-05 DIAGNOSIS — J45909 Unspecified asthma, uncomplicated: Secondary | ICD-10-CM | POA: Insufficient documentation

## 2016-09-05 DIAGNOSIS — J069 Acute upper respiratory infection, unspecified: Secondary | ICD-10-CM | POA: Diagnosis not present

## 2016-09-05 DIAGNOSIS — I10 Essential (primary) hypertension: Secondary | ICD-10-CM | POA: Insufficient documentation

## 2016-09-05 DIAGNOSIS — B349 Viral infection, unspecified: Secondary | ICD-10-CM | POA: Diagnosis not present

## 2016-09-05 HISTORY — DX: Chronic obstructive pulmonary disease, unspecified: J44.9

## 2016-09-05 HISTORY — DX: Cardiac arrhythmia, unspecified: I49.9

## 2016-09-05 LAB — CBC WITH DIFFERENTIAL/PLATELET
BASOS PCT: 0 %
Basophils Absolute: 0 10*3/uL (ref 0–0.1)
Eosinophils Absolute: 0 10*3/uL (ref 0–0.7)
Eosinophils Relative: 0 %
HEMATOCRIT: 37.9 % — AB (ref 40.0–52.0)
HEMOGLOBIN: 13.8 g/dL (ref 13.0–18.0)
LYMPHS ABS: 1.1 10*3/uL (ref 1.0–3.6)
Lymphocytes Relative: 11 %
MCH: 32.1 pg (ref 26.0–34.0)
MCHC: 36.5 g/dL — AB (ref 32.0–36.0)
MCV: 88 fL (ref 80.0–100.0)
MONOS PCT: 8 %
Monocytes Absolute: 0.8 10*3/uL (ref 0.2–1.0)
NEUTROS ABS: 8.2 10*3/uL — AB (ref 1.4–6.5)
NEUTROS PCT: 81 %
Platelets: 174 10*3/uL (ref 150–440)
RBC: 4.31 MIL/uL — AB (ref 4.40–5.90)
RDW: 13.4 % (ref 11.5–14.5)
WBC: 10.1 10*3/uL (ref 3.8–10.6)

## 2016-09-05 LAB — COMPREHENSIVE METABOLIC PANEL
ALT: 32 U/L (ref 17–63)
ANION GAP: 10 (ref 5–15)
AST: 28 U/L (ref 15–41)
Albumin: 3.8 g/dL (ref 3.5–5.0)
Alkaline Phosphatase: 66 U/L (ref 38–126)
BUN: 15 mg/dL (ref 6–20)
CHLORIDE: 101 mmol/L (ref 101–111)
CO2: 22 mmol/L (ref 22–32)
Calcium: 8.3 mg/dL — ABNORMAL LOW (ref 8.9–10.3)
Creatinine, Ser: 0.98 mg/dL (ref 0.61–1.24)
GFR calc non Af Amer: >60 mL/min (ref >60)
Glucose, Bld: 123 mg/dL — ABNORMAL HIGH (ref 65–99)
Potassium: 3.4 mmol/L — ABNORMAL LOW (ref 3.5–5.1)
SODIUM: 133 mmol/L — AB (ref 135–145)
Total Bilirubin: 0.6 mg/dL (ref 0.3–1.2)
Total Protein: 7.6 g/dL (ref 6.5–8.1)

## 2016-09-05 LAB — INFLUENZA PANEL BY PCR (TYPE A & B)
INFLBPCR: NEGATIVE
Influenza A By PCR: NEGATIVE

## 2016-09-05 MED ORDER — GUAIFENESIN-CODEINE 100-10 MG/5ML PO SOLN: 5.0000 mL | Freq: Four times a day (QID) | ORAL | 0 refills | Status: DC | PRN

## 2016-09-05 MED ORDER — ONDANSETRON HCL 4 MG/2ML IJ SOLN
4.0000 mg | Freq: Once | INTRAMUSCULAR | Status: AC
  Administered 2016-09-05: 4 mg via INTRAVENOUS
  Filled 2016-09-05: qty 2

## 2016-09-05 MED ORDER — ONDANSETRON 4 MG PO TBDP: 4.0000 mg | ORAL_TABLET | Freq: Three times a day (TID) | ORAL | 0 refills | Status: DC | PRN

## 2016-09-05 MED ORDER — SODIUM CHLORIDE 0.9 % IV BOLUS (SEPSIS)
1000.0000 mL | Freq: Once | INTRAVENOUS | Status: AC
  Administered 2016-09-05: 1000 mL via INTRAVENOUS

## 2016-09-05 MED ORDER — KETOROLAC TROMETHAMINE 30 MG/ML IJ SOLN
30.0000 mg | Freq: Once | INTRAMUSCULAR | Status: AC
  Administered 2016-09-05: 30 mg via INTRAVENOUS
  Filled 2016-09-05: qty 1

## 2016-09-05 NOTE — ED Provider Notes (Signed)
[facility] Emergency Department Provider Note  Time seen: 5:28 PM  I have reviewed the triage vital signs and the nursing notes.   HISTORY  Chief Complaint Generalized Body Aches and Chills    HPI Jacob Perez is a 66 y.o. male with a past medical history of COPD, hypertension, presents to the emergency department for generalized fatigue/weakness, fever, cough, congestion and body aches. According to the patient for the past 3-4 days he has been experiencing extreme fatigue and weakness, fever at home but states she just took Tylenol prior to arrival. States diffuse body aches. Patient states he is not feeling any better and is feeling dehydrated so he came to the emergency department for evaluation. Denies any abdominal pain. Denies vomiting or diarrhea.  Past Medical History:  Diagnosis Date  . Arthritis   . Asthma   . COPD (chronic obstructive pulmonary disease) ([place])   . Hypertension   . Irregular heart beat     Patient Active Problem List   Diagnosis Date Noted  . DDD (degenerative disc disease), lumbar 10/02/2015  . Sciatica of left side associated with disorder of lumbosacral spine 10/02/2015  . Low back pain   . COPD (chronic obstructive pulmonary disease) ([place]) 10/01/2015    History reviewed. No pertinent surgical history.  Prior to Admission medications   Medication Sig Start Date End Date Taking? Authorizing Provider  HYDROcodone-acetaminophen (NORCO/VICODIN) 5-325 MG tablet Take 1-2 tablets by mouth every 4 (four) hours as needed for moderate pain. 10/05/15   Epifanio Lesches, MD  levofloxacin (LEVAQUIN) 500 MG tablet Take 1 tablet (500 mg total) by mouth daily. 10/05/15   Epifanio Lesches, MD  lisinopril (PRINIVIL,ZESTRIL) 10 MG tablet Take 10 mg by mouth daily.    Historical Provider, MD  predniSONE (STERAPRED UNI-PAK 21 TAB) 10 MG (21) TBPK tablet Take 1 tablet (10 mg total) by mouth daily. 4  Daily for 3 days 3 daily for 3 days 2  daily for 3 days 1 daily for 2 days 10/05/15   Epifanio Lesches, MD  tiotropium (SPIRIVA) 18 MCG inhalation capsule Place 1 capsule (18 mcg total) into inhaler and inhale daily. 10/05/15   Epifanio Lesches, MD  VENTOLIN HFA 108 (90 Base) MCG/ACT inhaler Inhale 2 puffs into the lungs every 4 (four) hours as needed. 10/05/15   Epifanio Lesches, MD    Allergies  Allergen Reactions  . Ace Inhibitors     Cough   . Contrast Media [Iodinated Diagnostic Agents] Itching and Nausea And Vomiting    No family history on file.  Social History Social History  Substance Use Topics  . Smoking status: Current Every Day Smoker  . Smokeless tobacco: Not on file  . Alcohol use No    Review of Systems Constitutional: Subjective fever. Positive for nasal congestion. Cardiovascular: Negative for chest pain. Respiratory: Negative for shortness of breath. Positive for cough. Gastrointestinal: Negative for abdominal pain Neurological: Negative for headache 10-point ROS otherwise negative.  ____________________________________________   PHYSICAL EXAM:  VITAL SIGNS: ED Triage Vitals  Enc Vitals Group     BP 09/05/16 1707 100/62     Pulse Rate 09/05/16 1704 (!) 102     Resp 09/05/16 1704 (!) 22     Temp 09/05/16 1704 98.7 F (37.1 C)     Temp Source 09/05/16 1704 Oral     SpO2 09/05/16 1704 95 %     Weight 09/05/16 1705 160 lb (72.6 kg)     Height 09/05/16 1705 6' (1.829 m)  Head Circumference --      Peak Flow --      Pain Score 09/05/16 1705 9     Pain Loc --      Pain Edu? --      Excl. in [place]? --     Constitutional: Alert and oriented. Well appearing and in no distress. Eyes: Normal exam ENT   Head: Normocephalic and atraumatic.   Mouth/Throat: Mucous membranes are moist.Mild pharyngeal erythema. Cardiovascular: Normal rate, regular rhythm. No murmur Respiratory: Normal respiratory effort without tachypnea nor retractions. Breath sounds are clear Gastrointestinal:  Soft and nontender. No distention. Musculoskeletal: Nontender with normal range of motion in all extremities.  Neurologic:  Normal speech and language. No gross focal neurologic deficits Skin:  Skin is warm, dry and intact.  Psychiatric: Mood and affect are normal.   ____________________________________________   RADIOLOGY  X-rays negative  ____________________________________________   INITIAL IMPRESSION / ASSESSMENT AND PLAN / ED COURSE  Pertinent labs & imaging results that were available during my care of the patient were reviewed by me and considered in my medical decision making (see chart for details).  Patient presents to the emergency department with subjective fever, body aches, cough and congestion. Patient has a history of COPD but denies any current shortness of breath. Patient satting 95% on room air largely normal vitals besides slight tachycardia although the patient states he took Tylenol prior to arrival. We will check labs, influenza swab, IV hydrate and closely monitor in the emergency department while awaiting lab results.  X-rays negative. Labs are largely within normal limits including negative influenza testing. I discussed likely viral upper respiratory infection and supportive care at home including Tylenol/Motrin, fluids. Patient is agreeable to plan.  ____________________________________________   FINAL CLINICAL IMPRESSION(S) / ED DIAGNOSES  Viral syndrome    Harvest Dark, MD 09/05/16 [id number]

## 2016-09-05 NOTE — Discharge Instructions (Signed)
Please take your medications as prescribed, as needed. Drink plenty of fluids obtained plenty of rest. Return to the emergency department for any worsening symptoms such as increased trouble breathing, chest pain, or any other symptom personally concerning to yourself.

## 2016-09-05 NOTE — ED Triage Notes (Signed)
Pt arrives to ER via POV c/o body aches, chills, fever and nonproductive cough since Tuesday. Pt took tylenol approx 345PM today.

## 2016-12-20 DIAGNOSIS — R5383 Other fatigue: Secondary | ICD-10-CM | POA: Diagnosis not present

## 2016-12-20 DIAGNOSIS — I10 Essential (primary) hypertension: Secondary | ICD-10-CM | POA: Diagnosis not present

## 2016-12-20 DIAGNOSIS — Z716 Tobacco abuse counseling: Secondary | ICD-10-CM | POA: Diagnosis not present

## 2016-12-20 DIAGNOSIS — I519 Heart disease, unspecified: Secondary | ICD-10-CM | POA: Diagnosis not present

## 2016-12-20 DIAGNOSIS — G8929 Other chronic pain: Secondary | ICD-10-CM | POA: Diagnosis not present

## 2016-12-20 DIAGNOSIS — M549 Dorsalgia, unspecified: Secondary | ICD-10-CM | POA: Diagnosis not present

## 2016-12-20 DIAGNOSIS — I361 Nonrheumatic tricuspid (valve) insufficiency: Secondary | ICD-10-CM | POA: Diagnosis not present

## 2016-12-20 DIAGNOSIS — R079 Chest pain, unspecified: Secondary | ICD-10-CM | POA: Diagnosis not present

## 2016-12-20 DIAGNOSIS — R0600 Dyspnea, unspecified: Secondary | ICD-10-CM | POA: Diagnosis not present

## 2016-12-20 DIAGNOSIS — M79671 Pain in right foot: Secondary | ICD-10-CM | POA: Diagnosis not present

## 2016-12-20 DIAGNOSIS — R61 Generalized hyperhidrosis: Secondary | ICD-10-CM | POA: Diagnosis not present

## 2016-12-20 DIAGNOSIS — R252 Cramp and spasm: Secondary | ICD-10-CM | POA: Diagnosis not present

## 2016-12-20 DIAGNOSIS — F17210 Nicotine dependence, cigarettes, uncomplicated: Secondary | ICD-10-CM | POA: Diagnosis not present

## 2016-12-20 DIAGNOSIS — M79662 Pain in left lower leg: Secondary | ICD-10-CM | POA: Diagnosis not present

## 2016-12-20 DIAGNOSIS — J449 Chronic obstructive pulmonary disease, unspecified: Secondary | ICD-10-CM | POA: Diagnosis not present

## 2016-12-20 DIAGNOSIS — M79672 Pain in left foot: Secondary | ICD-10-CM | POA: Diagnosis not present

## 2016-12-20 DIAGNOSIS — I480 Paroxysmal atrial fibrillation: Secondary | ICD-10-CM | POA: Diagnosis not present

## 2016-12-20 DIAGNOSIS — J45909 Unspecified asthma, uncomplicated: Secondary | ICD-10-CM | POA: Diagnosis not present

## 2016-12-20 DIAGNOSIS — I340 Nonrheumatic mitral (valve) insufficiency: Secondary | ICD-10-CM | POA: Diagnosis not present

## 2016-12-20 DIAGNOSIS — R0789 Other chest pain: Secondary | ICD-10-CM | POA: Diagnosis not present

## 2016-12-20 DIAGNOSIS — I4891 Unspecified atrial fibrillation: Secondary | ICD-10-CM | POA: Insufficient documentation

## 2016-12-20 DIAGNOSIS — I209 Angina pectoris, unspecified: Secondary | ICD-10-CM | POA: Diagnosis not present

## 2016-12-20 DIAGNOSIS — M79661 Pain in right lower leg: Secondary | ICD-10-CM | POA: Diagnosis not present

## 2016-12-21 DIAGNOSIS — J449 Chronic obstructive pulmonary disease, unspecified: Secondary | ICD-10-CM | POA: Diagnosis not present

## 2016-12-21 DIAGNOSIS — R252 Cramp and spasm: Secondary | ICD-10-CM | POA: Diagnosis not present

## 2016-12-21 DIAGNOSIS — I4891 Unspecified atrial fibrillation: Secondary | ICD-10-CM | POA: Diagnosis not present

## 2016-12-21 DIAGNOSIS — R0789 Other chest pain: Secondary | ICD-10-CM | POA: Diagnosis not present

## 2016-12-22 DIAGNOSIS — R252 Cramp and spasm: Secondary | ICD-10-CM | POA: Diagnosis not present

## 2016-12-22 DIAGNOSIS — J449 Chronic obstructive pulmonary disease, unspecified: Secondary | ICD-10-CM | POA: Diagnosis not present

## 2016-12-22 DIAGNOSIS — R0789 Other chest pain: Secondary | ICD-10-CM | POA: Diagnosis not present

## 2016-12-22 DIAGNOSIS — I4891 Unspecified atrial fibrillation: Secondary | ICD-10-CM | POA: Diagnosis not present

## 2016-12-27 DIAGNOSIS — J449 Chronic obstructive pulmonary disease, unspecified: Secondary | ICD-10-CM | POA: Diagnosis not present

## 2016-12-27 DIAGNOSIS — I4891 Unspecified atrial fibrillation: Secondary | ICD-10-CM | POA: Diagnosis not present

## 2016-12-27 DIAGNOSIS — E039 Hypothyroidism, unspecified: Secondary | ICD-10-CM | POA: Diagnosis not present

## 2016-12-27 DIAGNOSIS — F17200 Nicotine dependence, unspecified, uncomplicated: Secondary | ICD-10-CM | POA: Diagnosis not present

## 2017-01-11 DIAGNOSIS — R0789 Other chest pain: Secondary | ICD-10-CM | POA: Diagnosis not present

## 2017-01-11 DIAGNOSIS — I480 Paroxysmal atrial fibrillation: Secondary | ICD-10-CM | POA: Diagnosis not present

## 2017-01-11 DIAGNOSIS — Z6823 Body mass index (BMI) 23.0-23.9, adult: Secondary | ICD-10-CM | POA: Diagnosis not present

## 2017-01-14 DIAGNOSIS — I519 Heart disease, unspecified: Secondary | ICD-10-CM | POA: Diagnosis not present

## 2017-01-14 DIAGNOSIS — I361 Nonrheumatic tricuspid (valve) insufficiency: Secondary | ICD-10-CM | POA: Diagnosis not present

## 2017-01-14 DIAGNOSIS — I480 Paroxysmal atrial fibrillation: Secondary | ICD-10-CM | POA: Diagnosis not present

## 2017-01-31 DIAGNOSIS — I480 Paroxysmal atrial fibrillation: Secondary | ICD-10-CM | POA: Diagnosis not present

## 2017-01-31 DIAGNOSIS — R0789 Other chest pain: Secondary | ICD-10-CM | POA: Diagnosis not present

## 2017-02-02 DIAGNOSIS — I480 Paroxysmal atrial fibrillation: Secondary | ICD-10-CM | POA: Diagnosis not present

## 2017-02-02 DIAGNOSIS — R0789 Other chest pain: Secondary | ICD-10-CM | POA: Diagnosis not present

## 2017-02-07 DIAGNOSIS — E039 Hypothyroidism, unspecified: Secondary | ICD-10-CM | POA: Diagnosis not present

## 2017-02-11 DIAGNOSIS — Z6823 Body mass index (BMI) 23.0-23.9, adult: Secondary | ICD-10-CM | POA: Diagnosis not present

## 2017-02-11 DIAGNOSIS — I480 Paroxysmal atrial fibrillation: Secondary | ICD-10-CM | POA: Diagnosis not present

## 2017-06-18 DIAGNOSIS — M5117 Intervertebral disc disorders with radiculopathy, lumbosacral region: Secondary | ICD-10-CM | POA: Diagnosis not present

## 2017-06-18 DIAGNOSIS — F17210 Nicotine dependence, cigarettes, uncomplicated: Secondary | ICD-10-CM | POA: Diagnosis not present

## 2017-06-18 DIAGNOSIS — Z7982 Long term (current) use of aspirin: Secondary | ICD-10-CM | POA: Diagnosis not present

## 2017-06-18 DIAGNOSIS — M5416 Radiculopathy, lumbar region: Secondary | ICD-10-CM | POA: Diagnosis not present

## 2017-06-18 DIAGNOSIS — M545 Low back pain: Secondary | ICD-10-CM | POA: Diagnosis not present

## 2017-06-18 DIAGNOSIS — J449 Chronic obstructive pulmonary disease, unspecified: Secondary | ICD-10-CM | POA: Diagnosis not present

## 2017-06-18 DIAGNOSIS — Z6823 Body mass index (BMI) 23.0-23.9, adult: Secondary | ICD-10-CM | POA: Diagnosis not present

## 2017-06-18 DIAGNOSIS — I4891 Unspecified atrial fibrillation: Secondary | ICD-10-CM | POA: Diagnosis not present

## 2017-06-18 DIAGNOSIS — I10 Essential (primary) hypertension: Secondary | ICD-10-CM | POA: Diagnosis not present

## 2017-06-29 DIAGNOSIS — J449 Chronic obstructive pulmonary disease, unspecified: Secondary | ICD-10-CM | POA: Diagnosis not present

## 2017-06-29 DIAGNOSIS — Z1331 Encounter for screening for depression: Secondary | ICD-10-CM | POA: Diagnosis not present

## 2017-06-29 DIAGNOSIS — R5383 Other fatigue: Secondary | ICD-10-CM | POA: Diagnosis not present

## 2017-06-29 DIAGNOSIS — Z125 Encounter for screening for malignant neoplasm of prostate: Secondary | ICD-10-CM | POA: Diagnosis not present

## 2017-06-29 DIAGNOSIS — I4891 Unspecified atrial fibrillation: Secondary | ICD-10-CM | POA: Diagnosis not present

## 2017-06-29 DIAGNOSIS — G629 Polyneuropathy, unspecified: Secondary | ICD-10-CM | POA: Diagnosis not present

## 2017-06-29 DIAGNOSIS — G4733 Obstructive sleep apnea (adult) (pediatric): Secondary | ICD-10-CM | POA: Diagnosis not present

## 2017-08-31 DIAGNOSIS — Z6824 Body mass index (BMI) 24.0-24.9, adult: Secondary | ICD-10-CM | POA: Diagnosis not present

## 2017-08-31 DIAGNOSIS — I480 Paroxysmal atrial fibrillation: Secondary | ICD-10-CM | POA: Diagnosis not present

## 2017-08-31 DIAGNOSIS — R0602 Shortness of breath: Secondary | ICD-10-CM | POA: Diagnosis not present

## 2017-11-01 ENCOUNTER — Other Ambulatory Visit: Payer: Self-pay | Admitting: Family Medicine

## 2017-11-01 DIAGNOSIS — H5330 Unspecified disorder of binocular vision: Secondary | ICD-10-CM | POA: Diagnosis not present

## 2017-11-01 DIAGNOSIS — H532 Diplopia: Secondary | ICD-10-CM | POA: Diagnosis not present

## 2017-11-01 DIAGNOSIS — Z6823 Body mass index (BMI) 23.0-23.9, adult: Secondary | ICD-10-CM | POA: Diagnosis not present

## 2017-11-02 ENCOUNTER — Ambulatory Visit
Admission: RE | Admit: 2017-11-02 | Discharge: 2017-11-02 | Disposition: A | Discharge status: Home / Self Care | Payer: BLUE CROSS/BLUE SHIELD | Source: Ambulatory Visit | Attending: Family Medicine | Admitting: Family Medicine

## 2017-11-02 DIAGNOSIS — H532 Diplopia: Secondary | ICD-10-CM | POA: Diagnosis not present

## 2017-11-02 DIAGNOSIS — R42 Dizziness and giddiness: Secondary | ICD-10-CM | POA: Diagnosis not present

## 2017-11-02 LAB — POCT I-STAT CREATININE: CREATININE: 0.9 mg/dL (ref 0.61–1.24)

## 2017-11-02 MED ORDER — GADOBENATE DIMEGLUMINE 529 MG/ML IV SOLN
15.0000 mL | Freq: Once | INTRAVENOUS | Status: AC | PRN
  Administered 2017-11-02: 15 mL via INTRAVENOUS

## 2017-11-04 DIAGNOSIS — H539 Unspecified visual disturbance: Secondary | ICD-10-CM | POA: Diagnosis not present

## 2018-01-10 DIAGNOSIS — M545 Low back pain: Secondary | ICD-10-CM | POA: Diagnosis not present

## 2018-01-10 DIAGNOSIS — E875 Hyperkalemia: Secondary | ICD-10-CM | POA: Diagnosis not present

## 2018-01-10 DIAGNOSIS — I4891 Unspecified atrial fibrillation: Secondary | ICD-10-CM | POA: Diagnosis not present

## 2018-01-10 DIAGNOSIS — J449 Chronic obstructive pulmonary disease, unspecified: Secondary | ICD-10-CM | POA: Diagnosis not present

## 2018-01-10 DIAGNOSIS — Z9181 History of falling: Secondary | ICD-10-CM | POA: Diagnosis not present

## 2018-03-01 DIAGNOSIS — Z6823 Body mass index (BMI) 23.0-23.9, adult: Secondary | ICD-10-CM | POA: Diagnosis not present

## 2018-03-01 DIAGNOSIS — I480 Paroxysmal atrial fibrillation: Secondary | ICD-10-CM | POA: Diagnosis not present

## 2018-06-27 DIAGNOSIS — J439 Emphysema, unspecified: Secondary | ICD-10-CM | POA: Diagnosis not present

## 2018-06-27 DIAGNOSIS — F17210 Nicotine dependence, cigarettes, uncomplicated: Secondary | ICD-10-CM | POA: Diagnosis not present

## 2018-06-27 DIAGNOSIS — J449 Chronic obstructive pulmonary disease, unspecified: Secondary | ICD-10-CM | POA: Diagnosis not present

## 2018-06-27 DIAGNOSIS — F17200 Nicotine dependence, unspecified, uncomplicated: Secondary | ICD-10-CM | POA: Diagnosis not present

## 2018-06-27 DIAGNOSIS — I4891 Unspecified atrial fibrillation: Secondary | ICD-10-CM | POA: Diagnosis not present

## 2018-06-27 DIAGNOSIS — I10 Essential (primary) hypertension: Secondary | ICD-10-CM | POA: Diagnosis not present

## 2018-06-27 DIAGNOSIS — I444 Left anterior fascicular block: Secondary | ICD-10-CM | POA: Diagnosis not present

## 2018-06-27 DIAGNOSIS — Z716 Tobacco abuse counseling: Secondary | ICD-10-CM | POA: Diagnosis not present

## 2018-06-27 DIAGNOSIS — R079 Chest pain, unspecified: Secondary | ICD-10-CM | POA: Diagnosis not present

## 2018-06-27 DIAGNOSIS — I480 Paroxysmal atrial fibrillation: Secondary | ICD-10-CM | POA: Diagnosis not present

## 2018-06-27 DIAGNOSIS — R0789 Other chest pain: Secondary | ICD-10-CM | POA: Diagnosis not present

## 2018-06-27 DIAGNOSIS — R0602 Shortness of breath: Secondary | ICD-10-CM | POA: Diagnosis not present

## 2018-06-27 DIAGNOSIS — D72829 Elevated white blood cell count, unspecified: Secondary | ICD-10-CM | POA: Diagnosis not present

## 2018-06-28 DIAGNOSIS — R079 Chest pain, unspecified: Secondary | ICD-10-CM | POA: Diagnosis not present

## 2018-06-28 DIAGNOSIS — R0602 Shortness of breath: Secondary | ICD-10-CM | POA: Diagnosis not present

## 2018-06-28 DIAGNOSIS — R000 Tachycardia, unspecified: Secondary | ICD-10-CM | POA: Diagnosis not present

## 2018-06-28 DIAGNOSIS — F17200 Nicotine dependence, unspecified, uncomplicated: Secondary | ICD-10-CM | POA: Diagnosis not present

## 2018-06-28 DIAGNOSIS — J449 Chronic obstructive pulmonary disease, unspecified: Secondary | ICD-10-CM | POA: Diagnosis not present

## 2018-06-28 DIAGNOSIS — I480 Paroxysmal atrial fibrillation: Secondary | ICD-10-CM | POA: Diagnosis not present

## 2018-06-28 DIAGNOSIS — I4891 Unspecified atrial fibrillation: Secondary | ICD-10-CM | POA: Diagnosis not present

## 2018-07-18 DIAGNOSIS — I4891 Unspecified atrial fibrillation: Secondary | ICD-10-CM | POA: Diagnosis not present

## 2018-07-18 DIAGNOSIS — R079 Chest pain, unspecified: Secondary | ICD-10-CM | POA: Diagnosis not present

## 2018-07-18 DIAGNOSIS — Z1331 Encounter for screening for depression: Secondary | ICD-10-CM | POA: Diagnosis not present

## 2018-07-18 DIAGNOSIS — F17200 Nicotine dependence, unspecified, uncomplicated: Secondary | ICD-10-CM | POA: Diagnosis not present

## 2018-07-18 DIAGNOSIS — J449 Chronic obstructive pulmonary disease, unspecified: Secondary | ICD-10-CM | POA: Diagnosis not present

## 2018-07-18 DIAGNOSIS — Z79899 Other long term (current) drug therapy: Secondary | ICD-10-CM | POA: Diagnosis not present

## 2018-08-01 DIAGNOSIS — I480 Paroxysmal atrial fibrillation: Secondary | ICD-10-CM | POA: Diagnosis not present

## 2018-08-01 DIAGNOSIS — Z6823 Body mass index (BMI) 23.0-23.9, adult: Secondary | ICD-10-CM | POA: Diagnosis not present

## 2018-09-22 DIAGNOSIS — I517 Cardiomegaly: Secondary | ICD-10-CM | POA: Diagnosis not present

## 2018-09-22 DIAGNOSIS — R911 Solitary pulmonary nodule: Secondary | ICD-10-CM | POA: Diagnosis not present

## 2018-09-22 DIAGNOSIS — I4891 Unspecified atrial fibrillation: Secondary | ICD-10-CM | POA: Diagnosis not present

## 2018-09-22 DIAGNOSIS — I77819 Aortic ectasia, unspecified site: Secondary | ICD-10-CM | POA: Diagnosis not present

## 2018-09-25 DIAGNOSIS — I10 Essential (primary) hypertension: Secondary | ICD-10-CM | POA: Diagnosis not present

## 2018-09-25 DIAGNOSIS — I4891 Unspecified atrial fibrillation: Secondary | ICD-10-CM | POA: Diagnosis not present

## 2018-09-25 DIAGNOSIS — K219 Gastro-esophageal reflux disease without esophagitis: Secondary | ICD-10-CM | POA: Diagnosis not present

## 2018-09-25 DIAGNOSIS — J449 Chronic obstructive pulmonary disease, unspecified: Secondary | ICD-10-CM | POA: Diagnosis not present

## 2018-09-25 DIAGNOSIS — I480 Paroxysmal atrial fibrillation: Secondary | ICD-10-CM | POA: Diagnosis not present

## 2018-09-25 DIAGNOSIS — Z91041 Radiographic dye allergy status: Secondary | ICD-10-CM | POA: Diagnosis not present

## 2018-09-26 DIAGNOSIS — Z91041 Radiographic dye allergy status: Secondary | ICD-10-CM | POA: Diagnosis not present

## 2018-09-26 DIAGNOSIS — I4891 Unspecified atrial fibrillation: Secondary | ICD-10-CM | POA: Diagnosis not present

## 2018-09-26 DIAGNOSIS — J449 Chronic obstructive pulmonary disease, unspecified: Secondary | ICD-10-CM | POA: Diagnosis not present

## 2018-09-26 DIAGNOSIS — I480 Paroxysmal atrial fibrillation: Secondary | ICD-10-CM | POA: Diagnosis not present

## 2018-09-26 DIAGNOSIS — K219 Gastro-esophageal reflux disease without esophagitis: Secondary | ICD-10-CM | POA: Diagnosis not present

## 2018-09-26 DIAGNOSIS — I10 Essential (primary) hypertension: Secondary | ICD-10-CM | POA: Diagnosis not present

## 2019-08-24 IMAGING — MR MR HEAD WO/W CM
9 of 11 series · 29 of 48 positions shown · IV contrast (15 ML MULTIHANCE)
Comparison: Head CT 04/18/2004

CLINICAL DATA: Dizziness and diplopia.  Headaches.

EXAM:
MRI HEAD WITHOUT AND WITH CONTRAST
TECHNIQUE: Multiplanar, multiecho pulse sequences of the brain and surrounding
structures were obtained without and with intravenous contrast.
CONTRAST:  15 mL MultiHance

[Series 2: T1 · sagittal · 5.0mm · 0.47mm/px · 1 of 23 slices shown (1 of 2)]
[im 1/23]
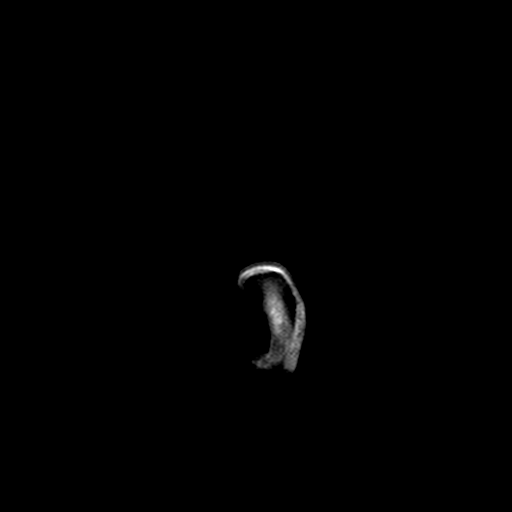

[Series 6: DWI · coronal · 5.0mm · 1.80mm/px · 2 of 40 slices shown]
[im 1/40]
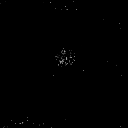
[im 40/40]
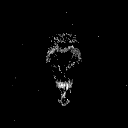

[Series 7: T2 · axial · 5.0mm · 0.45mm/px · z∈[-43,+110]mm · 2 of 23 slices shown (1 of 2)]
[im 1/23]
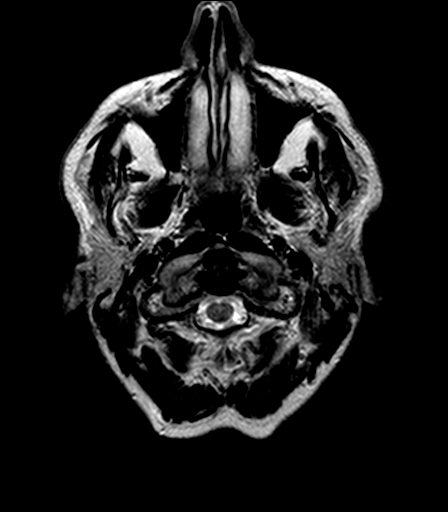
[im 23/23]
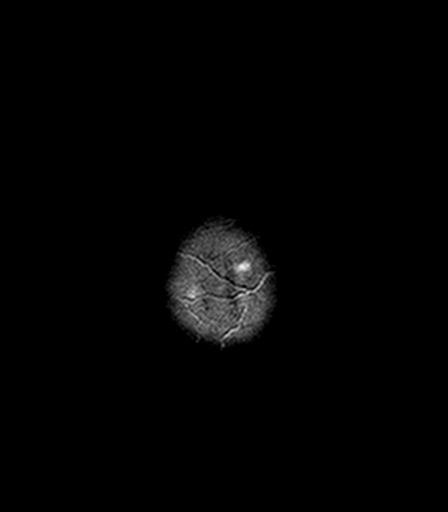

[Series 8: FLAIR · axial · 3.0mm · 0.90mm/px · z∈[-41,+108]mm · 4 of 51 slices shown]
[im 1/51]
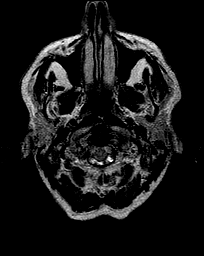
[im 17/51]
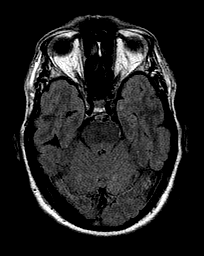
[im 34/51]
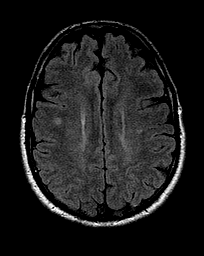
[im 51/51]
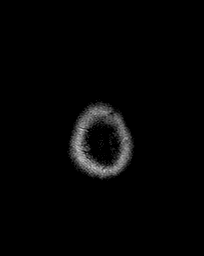

[Series 9: T2 · axial · 5.0mm · 0.45mm/px · z∈[-43,+110]mm · 2 of 23 slices shown (2 of 2)]
[im 1/23]
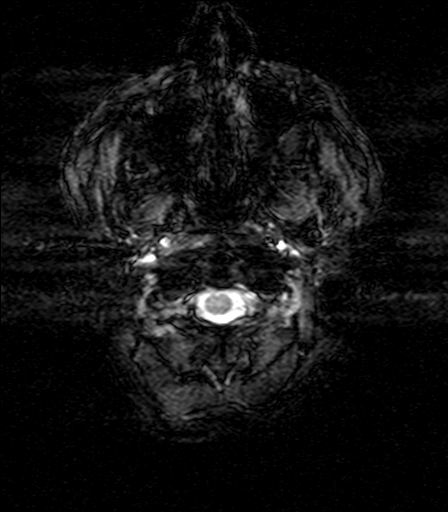
[im 23/23]
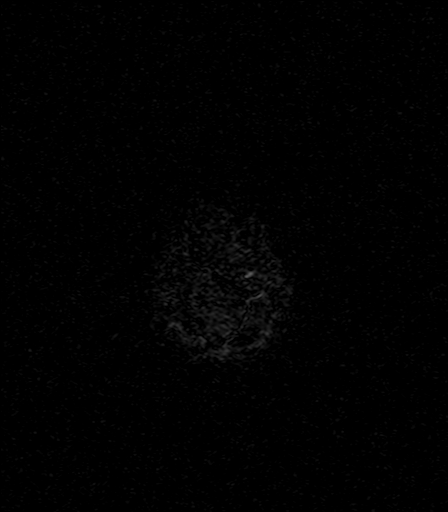

[Series 10: T1 · axial · 1.0mm · 0.50mm/px · z∈[-56,+45]mm · 5 of 176 slices shown (2 of 2)]
[im 1/176]
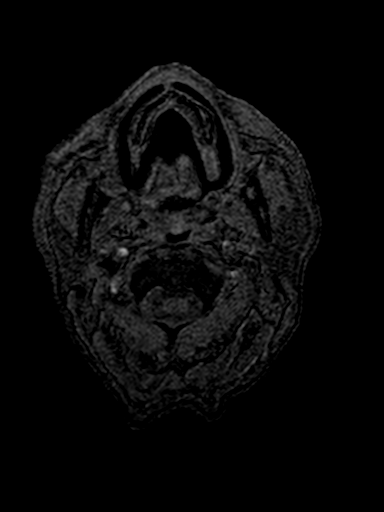
[im 30/176]
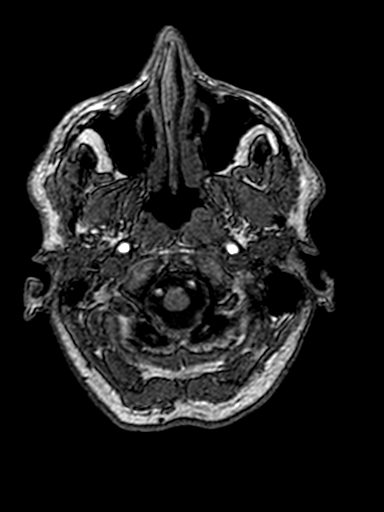
[im 59/176]
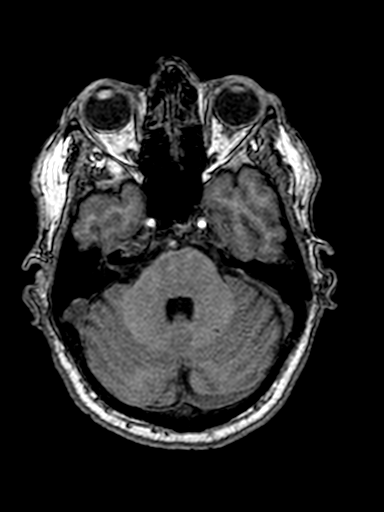
[im 73/176]
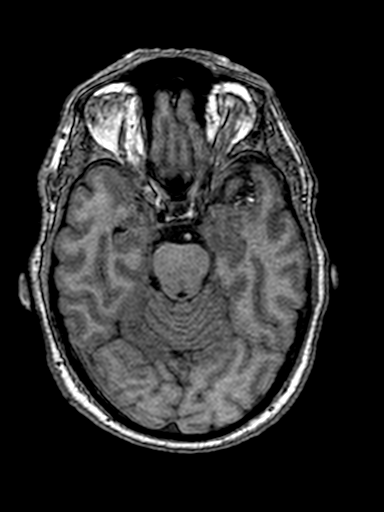
[im 103/176]
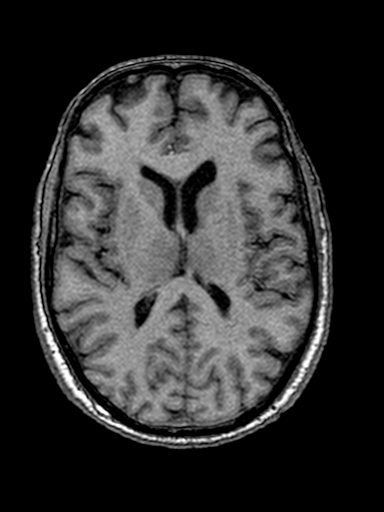

[Series 13: T2 post-contrast · coronal · 5.0mm · 0.45mm/px · 2 of 31 slices shown]
[im 1/31]
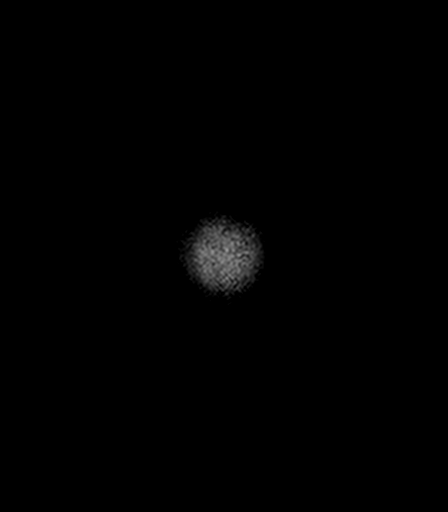
[im 31/31]
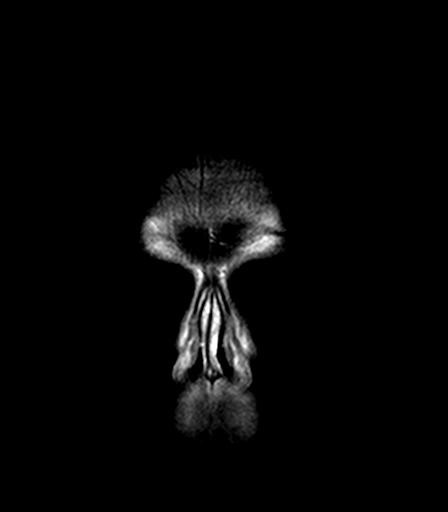

[Series 14: T1 post-contrast · axial · 1.0mm · 0.50mm/px · z∈[-56,+117]mm · 9 of 176 slices shown (1 of 2)]
[im 1/176]
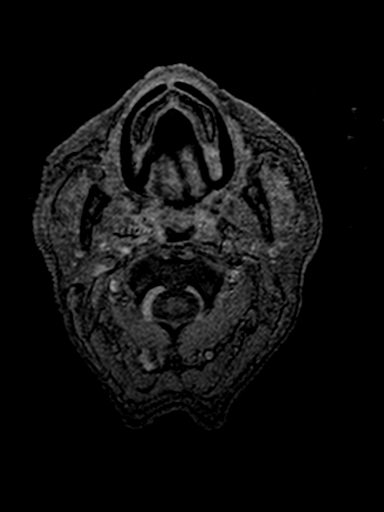
[im 30/176]
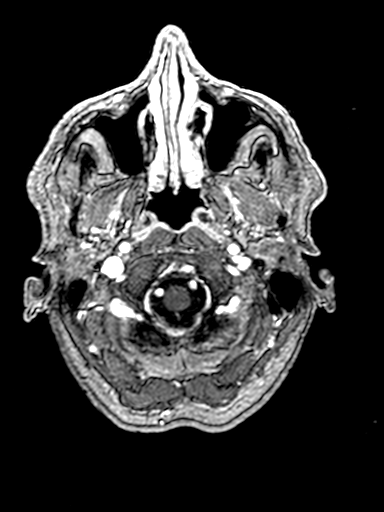
[im 59/176]
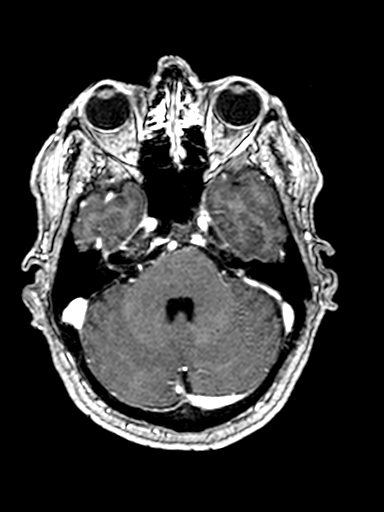
[im 73/176]
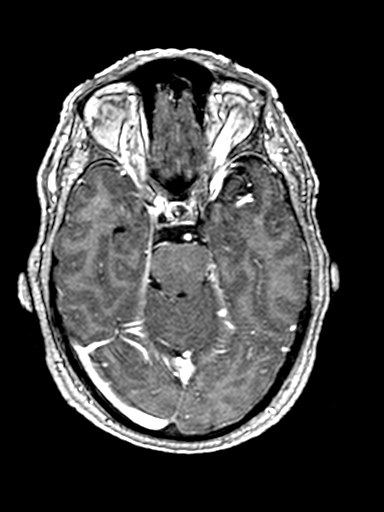
[im 88/176]
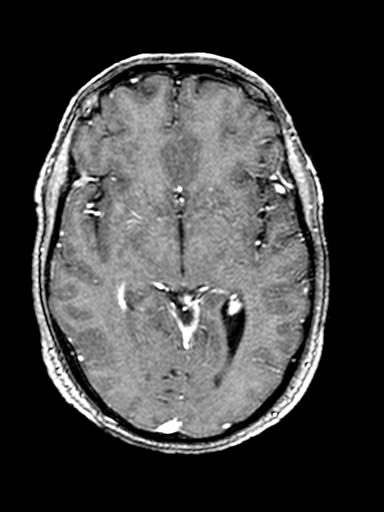
[im 103/176]
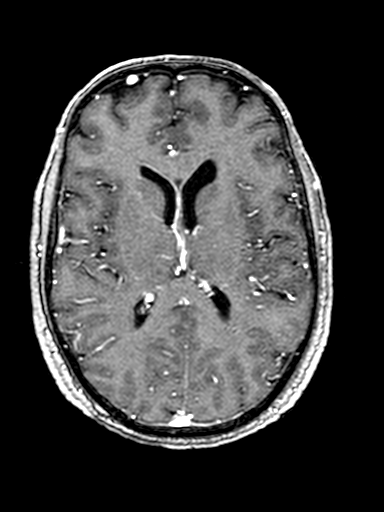
[im 117/176]
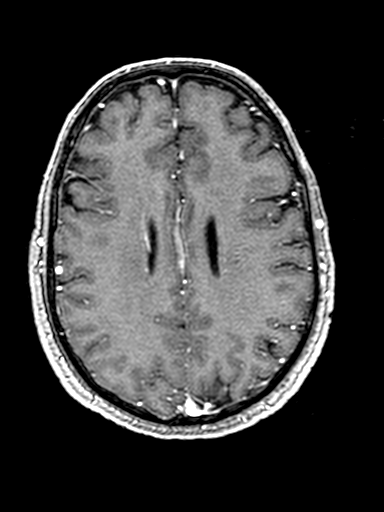
[im 146/176]
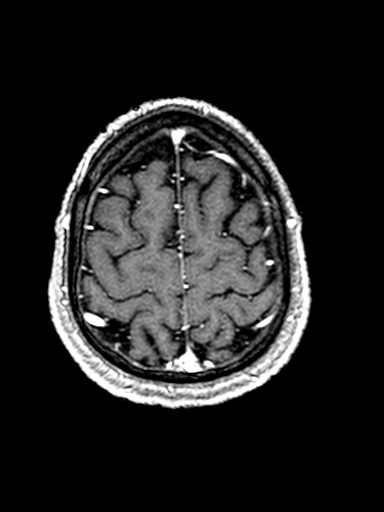
[im 176/176]
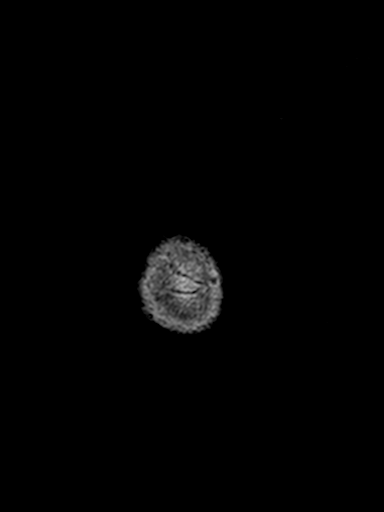

[Series 15: T1 post-contrast · coronal · 5.0mm · 0.45mm/px · 2 of 31 slices shown (2 of 2)]
[im 1/31]
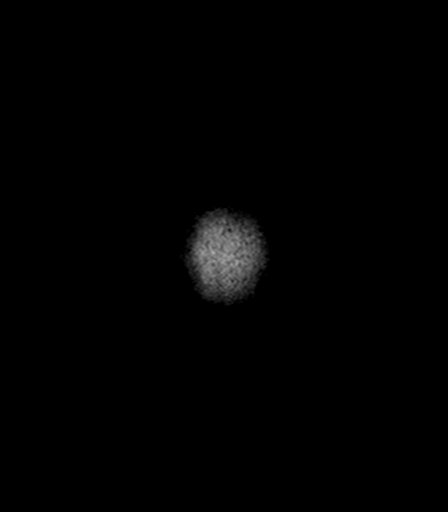
[im 31/31]
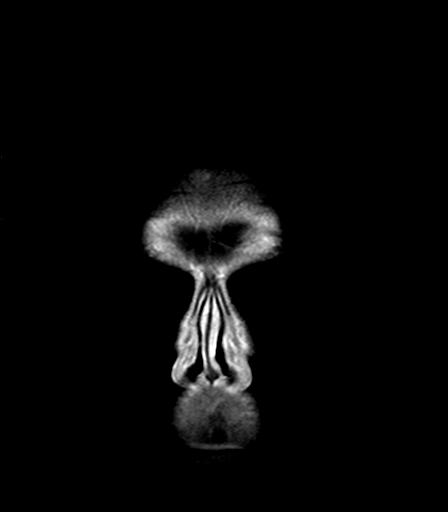

[29 of 48 positions shown; findings below may reference images not displayed]

FINDINGS: Brain: There is no evidence of acute infarct, intracranial
hemorrhage, mass, midline shift, or extra-axial fluid collection.
The ventricles and sulci are normal. A few small foci of T2
hyperintensity are present in the cerebral white matter, most
notably a 7 mm focus in the posterior right frontal lobe. No
abnormal enhancement is identified.

Vascular: Major intracranial vascular flow voids are preserved.

Skull and upper cervical spine: Unremarkable bone marrow signal.

Sinuses/Orbits: Unremarkable orbits. Mild bilateral ethmoid air cell
mucosal thickening. Clear mastoid air cells.

Other: None.
IMPRESSION: 1. No acute intracranial abnormality.
2. Minimal nonspecific cerebral white matter T2 hyperintensities.
These may reflect minimal chronic small vessel ischemia, migraines,
or prior infection/inflammation. No specific findings for
demyelinating disease.

## 2019-11-15 DIAGNOSIS — L409 Psoriasis, unspecified: Secondary | ICD-10-CM | POA: Diagnosis not present

## 2019-11-15 DIAGNOSIS — F17200 Nicotine dependence, unspecified, uncomplicated: Secondary | ICD-10-CM | POA: Diagnosis not present

## 2019-11-15 DIAGNOSIS — I4891 Unspecified atrial fibrillation: Secondary | ICD-10-CM | POA: Diagnosis not present

## 2019-11-15 DIAGNOSIS — J449 Chronic obstructive pulmonary disease, unspecified: Secondary | ICD-10-CM | POA: Diagnosis not present

## 2019-11-29 DIAGNOSIS — R634 Abnormal weight loss: Secondary | ICD-10-CM | POA: Diagnosis not present

## 2019-11-29 DIAGNOSIS — Z139 Encounter for screening, unspecified: Secondary | ICD-10-CM | POA: Diagnosis not present

## 2019-11-29 DIAGNOSIS — Z86010 Personal history of colonic polyps: Secondary | ICD-10-CM | POA: Diagnosis not present

## 2019-11-29 DIAGNOSIS — R1084 Generalized abdominal pain: Secondary | ICD-10-CM | POA: Diagnosis not present

## 2019-11-29 DIAGNOSIS — Z9181 History of falling: Secondary | ICD-10-CM | POA: Diagnosis not present

## 2019-12-04 DIAGNOSIS — R1084 Generalized abdominal pain: Secondary | ICD-10-CM | POA: Diagnosis not present

## 2019-12-04 DIAGNOSIS — N1330 Unspecified hydronephrosis: Secondary | ICD-10-CM | POA: Diagnosis not present

## 2019-12-18 DIAGNOSIS — Z86010 Personal history of colonic polyps: Secondary | ICD-10-CM | POA: Diagnosis not present

## 2019-12-18 DIAGNOSIS — D649 Anemia, unspecified: Secondary | ICD-10-CM | POA: Diagnosis not present

## 2019-12-18 DIAGNOSIS — K5904 Chronic idiopathic constipation: Secondary | ICD-10-CM | POA: Diagnosis not present

## 2019-12-18 DIAGNOSIS — R109 Unspecified abdominal pain: Secondary | ICD-10-CM | POA: Diagnosis not present

## 2019-12-21 DIAGNOSIS — R634 Abnormal weight loss: Secondary | ICD-10-CM | POA: Diagnosis not present

## 2019-12-21 DIAGNOSIS — J449 Chronic obstructive pulmonary disease, unspecified: Secondary | ICD-10-CM | POA: Diagnosis not present

## 2019-12-21 DIAGNOSIS — D649 Anemia, unspecified: Secondary | ICD-10-CM | POA: Diagnosis not present

## 2020-01-07 DIAGNOSIS — Z87891 Personal history of nicotine dependence: Secondary | ICD-10-CM | POA: Diagnosis not present

## 2020-01-07 DIAGNOSIS — R634 Abnormal weight loss: Secondary | ICD-10-CM | POA: Diagnosis not present

## 2020-01-07 DIAGNOSIS — Z6820 Body mass index (BMI) 20.0-20.9, adult: Secondary | ICD-10-CM | POA: Diagnosis not present

## 2020-01-07 DIAGNOSIS — R1084 Generalized abdominal pain: Secondary | ICD-10-CM | POA: Diagnosis not present

## 2020-02-05 DIAGNOSIS — K5904 Chronic idiopathic constipation: Secondary | ICD-10-CM | POA: Diagnosis not present

## 2020-02-05 DIAGNOSIS — R109 Unspecified abdominal pain: Secondary | ICD-10-CM | POA: Diagnosis not present

## 2020-02-05 DIAGNOSIS — Z86010 Personal history of colonic polyps: Secondary | ICD-10-CM | POA: Diagnosis not present

## 2020-02-05 DIAGNOSIS — R634 Abnormal weight loss: Secondary | ICD-10-CM | POA: Diagnosis not present

## 2020-02-20 DIAGNOSIS — I700 Atherosclerosis of aorta: Secondary | ICD-10-CM | POA: Diagnosis not present

## 2020-02-20 DIAGNOSIS — N281 Cyst of kidney, acquired: Secondary | ICD-10-CM | POA: Diagnosis not present

## 2020-02-20 DIAGNOSIS — M7989 Other specified soft tissue disorders: Secondary | ICD-10-CM | POA: Diagnosis not present

## 2020-02-20 DIAGNOSIS — K429 Umbilical hernia without obstruction or gangrene: Secondary | ICD-10-CM | POA: Diagnosis not present

## 2020-02-20 DIAGNOSIS — R109 Unspecified abdominal pain: Secondary | ICD-10-CM | POA: Diagnosis not present

## 2020-02-27 DIAGNOSIS — R1084 Generalized abdominal pain: Secondary | ICD-10-CM | POA: Diagnosis not present

## 2020-02-27 DIAGNOSIS — K2950 Unspecified chronic gastritis without bleeding: Secondary | ICD-10-CM | POA: Diagnosis not present

## 2020-02-27 DIAGNOSIS — Z86010 Personal history of colonic polyps: Secondary | ICD-10-CM | POA: Diagnosis not present

## 2020-02-27 DIAGNOSIS — D125 Benign neoplasm of sigmoid colon: Secondary | ICD-10-CM | POA: Diagnosis not present

## 2020-02-27 DIAGNOSIS — K2970 Gastritis, unspecified, without bleeding: Secondary | ICD-10-CM | POA: Diagnosis not present

## 2020-02-27 DIAGNOSIS — Z1211 Encounter for screening for malignant neoplasm of colon: Secondary | ICD-10-CM | POA: Diagnosis not present

## 2020-02-27 DIAGNOSIS — K635 Polyp of colon: Secondary | ICD-10-CM | POA: Diagnosis not present

## 2020-02-27 DIAGNOSIS — D126 Benign neoplasm of colon, unspecified: Secondary | ICD-10-CM | POA: Diagnosis not present

## 2020-02-29 DIAGNOSIS — C786 Secondary malignant neoplasm of retroperitoneum and peritoneum: Secondary | ICD-10-CM | POA: Diagnosis not present

## 2020-02-29 DIAGNOSIS — R911 Solitary pulmonary nodule: Secondary | ICD-10-CM | POA: Diagnosis not present

## 2020-02-29 DIAGNOSIS — R1084 Generalized abdominal pain: Secondary | ICD-10-CM | POA: Diagnosis not present

## 2020-02-29 DIAGNOSIS — F17200 Nicotine dependence, unspecified, uncomplicated: Secondary | ICD-10-CM | POA: Diagnosis not present

## 2020-03-04 DIAGNOSIS — C786 Secondary malignant neoplasm of retroperitoneum and peritoneum: Secondary | ICD-10-CM | POA: Diagnosis not present

## 2020-03-04 DIAGNOSIS — Z20822 Contact with and (suspected) exposure to covid-19: Secondary | ICD-10-CM | POA: Diagnosis not present

## 2020-03-04 DIAGNOSIS — Z6821 Body mass index (BMI) 21.0-21.9, adult: Secondary | ICD-10-CM | POA: Diagnosis not present

## 2020-03-07 DIAGNOSIS — D490 Neoplasm of unspecified behavior of digestive system: Secondary | ICD-10-CM | POA: Diagnosis not present

## 2020-03-07 DIAGNOSIS — C801 Malignant (primary) neoplasm, unspecified: Secondary | ICD-10-CM | POA: Diagnosis not present

## 2020-03-07 DIAGNOSIS — C786 Secondary malignant neoplasm of retroperitoneum and peritoneum: Secondary | ICD-10-CM | POA: Diagnosis not present

## 2020-03-07 DIAGNOSIS — R1903 Right lower quadrant abdominal swelling, mass and lump: Secondary | ICD-10-CM | POA: Diagnosis not present

## 2020-03-13 DIAGNOSIS — C481 Malignant neoplasm of specified parts of peritoneum: Secondary | ICD-10-CM | POA: Diagnosis not present

## 2020-04-02 DIAGNOSIS — C482 Malignant neoplasm of peritoneum, unspecified: Secondary | ICD-10-CM | POA: Diagnosis not present

## 2020-04-03 DIAGNOSIS — R0600 Dyspnea, unspecified: Secondary | ICD-10-CM | POA: Diagnosis not present

## 2020-04-03 DIAGNOSIS — C481 Malignant neoplasm of specified parts of peritoneum: Secondary | ICD-10-CM | POA: Diagnosis not present

## 2020-04-03 DIAGNOSIS — Z20822 Contact with and (suspected) exposure to covid-19: Secondary | ICD-10-CM | POA: Diagnosis not present

## 2020-04-03 DIAGNOSIS — R0602 Shortness of breath: Secondary | ICD-10-CM | POA: Diagnosis not present

## 2020-04-04 DIAGNOSIS — C481 Malignant neoplasm of specified parts of peritoneum: Secondary | ICD-10-CM | POA: Diagnosis not present

## 2020-04-04 DIAGNOSIS — R918 Other nonspecific abnormal finding of lung field: Secondary | ICD-10-CM | POA: Diagnosis not present

## 2020-04-04 DIAGNOSIS — J90 Pleural effusion, not elsewhere classified: Secondary | ICD-10-CM | POA: Diagnosis not present

## 2020-04-10 DIAGNOSIS — C482 Malignant neoplasm of peritoneum, unspecified: Secondary | ICD-10-CM | POA: Diagnosis not present

## 2020-04-15 DIAGNOSIS — C482 Malignant neoplasm of peritoneum, unspecified: Secondary | ICD-10-CM | POA: Diagnosis not present

## 2020-04-17 DIAGNOSIS — C481 Malignant neoplasm of specified parts of peritoneum: Secondary | ICD-10-CM | POA: Diagnosis not present

## 2020-05-28 DIAGNOSIS — M5136 Other intervertebral disc degeneration, lumbar region: Secondary | ICD-10-CM | POA: Diagnosis not present

## 2020-05-28 DIAGNOSIS — C481 Malignant neoplasm of specified parts of peritoneum: Secondary | ICD-10-CM | POA: Diagnosis not present

## 2020-05-28 DIAGNOSIS — R911 Solitary pulmonary nodule: Secondary | ICD-10-CM | POA: Diagnosis not present

## 2020-05-28 DIAGNOSIS — R590 Localized enlarged lymph nodes: Secondary | ICD-10-CM | POA: Diagnosis not present

## 2020-05-28 DIAGNOSIS — C269 Malignant neoplasm of ill-defined sites within the digestive system: Secondary | ICD-10-CM | POA: Diagnosis not present

## 2020-05-28 DIAGNOSIS — K429 Umbilical hernia without obstruction or gangrene: Secondary | ICD-10-CM | POA: Diagnosis not present

## 2020-05-28 DIAGNOSIS — J439 Emphysema, unspecified: Secondary | ICD-10-CM | POA: Diagnosis not present

## 2020-05-28 DIAGNOSIS — J90 Pleural effusion, not elsewhere classified: Secondary | ICD-10-CM | POA: Diagnosis not present

## 2020-05-28 DIAGNOSIS — D492 Neoplasm of unspecified behavior of bone, soft tissue, and skin: Secondary | ICD-10-CM | POA: Diagnosis not present

## 2020-05-28 DIAGNOSIS — S2241XA Multiple fractures of ribs, right side, initial encounter for closed fracture: Secondary | ICD-10-CM | POA: Diagnosis not present

## 2020-05-28 DIAGNOSIS — I700 Atherosclerosis of aorta: Secondary | ICD-10-CM | POA: Diagnosis not present

## 2020-05-29 DIAGNOSIS — Z79899 Other long term (current) drug therapy: Secondary | ICD-10-CM | POA: Diagnosis not present

## 2020-05-29 DIAGNOSIS — C481 Malignant neoplasm of specified parts of peritoneum: Secondary | ICD-10-CM | POA: Diagnosis not present

## 2020-07-30 ENCOUNTER — Telehealth: Payer: Self-pay | Admitting: Oncology

## 2020-07-30 NOTE — Telephone Encounter (Signed)
Patient rescheduled to 12/21 Labs, Follow Up

## 2020-07-31 ENCOUNTER — Inpatient Hospital Stay: Payer: Self-pay | Admitting: Oncology

## 2020-07-31 ENCOUNTER — Inpatient Hospital Stay: Payer: Self-pay

## 2020-08-04 NOTE — Progress Notes (Signed)
[facility]  [address] [place],  [place]  [address] [phone]  Clinic Day:  08/05/2020  Referring physician: Cyndi Bender, PA-C   HISTORY OF PRESENT ILLNESS:  The patient is a 69 y.o. male with metastatic gastrointestinal stromal tumor, including multiple tumor deposits throughout his abdomen.  Since early August 2021, the patient has been taking imatinib for his disease management.  He comes in today for routine follow-up.  Since his last visit, the patient claims to be doing okay.  However, he still has occasional abdominopelvic discomfort. A major problem he has had recently is a suboptimal appetite.  He denies having any new findings which concern him for overt signs of disease progression while on his imatinib.  PHYSICAL EXAM:  Blood pressure 112/64, pulse 91, temperature 97.9 F (36.6 C), resp. rate 16, height 6' (1.829 m), weight 152 lb 11.2 oz (69.3 kg), SpO2 100 %. Wt Readings from Last 3 Encounters:  08/05/20 152 lb 11.2 oz (69.3 kg)  09/05/16 160 lb (72.6 kg)  10/01/15 166 lb (75.3 kg)   Body mass index is 20.71 kg/m. Performance status (ECOG): 1 - Symptomatic but completely ambulatory Physical Exam Constitutional:      Appearance: Normal appearance. He is not ill-appearing.  HENT:     Mouth/Throat:     Mouth: Mucous membranes are moist.     Pharynx: Oropharynx is clear. No oropharyngeal exudate or posterior oropharyngeal erythema.  Cardiovascular:     Rate and Rhythm: Normal rate and regular rhythm.     Heart sounds: No murmur heard. No friction rub. No gallop.   Pulmonary:     Effort: Pulmonary effort is normal. No respiratory distress.     Breath sounds: Normal breath sounds. No wheezing, rhonchi or rales.  Chest:  Breasts:     Right: No axillary adenopathy or supraclavicular adenopathy.     Left: No axillary adenopathy or supraclavicular adenopathy.    Abdominal:     General: Bowel sounds are normal. There is no  distension.     Palpations: Abdomen is soft. There is no mass.     Tenderness: There is no abdominal tenderness.  Musculoskeletal:        General: No swelling.     Right lower leg: No edema.     Left lower leg: No edema.  Lymphadenopathy:     Cervical: No cervical adenopathy.     Upper Body:     Right upper body: No supraclavicular or axillary adenopathy.     Left upper body: No supraclavicular or axillary adenopathy.     Lower Body: No right inguinal adenopathy. No left inguinal adenopathy.  Skin:    General: Skin is warm.     Coloration: Skin is not jaundiced.     Findings: No lesion or rash.  Neurological:     General: No focal deficit present.     Mental Status: He is alert and oriented to person, place, and time. Mental status is at baseline.     Cranial Nerves: Cranial nerves are intact.  Psychiatric:        Mood and Affect: Mood normal.        Behavior: Behavior normal.        Thought Content: Thought content normal.     LABS:  Results for DEMARIAN, EPPS (MRN [id number]) as of 08/11/2020 12:13  Ref. Range 08/05/2020 00:00  Sodium Latest Ref Range: 137 - 147  143  Potassium Latest Ref Range: 3.4 - 5.3  4.4  Chloride Latest Ref Range: 99 - 108  106  CO2 Latest Ref Range: 13 - 22  29 (A)  Glucose Unknown 84  BUN Latest Ref Range: 4 - 21  14  Creatinine Latest Ref Range: 0.6 - 1.3  0.9  Calcium Latest Ref Range: 8.7 - 10.7  9.4  Alkaline Phosphatase Latest Ref Range: 25 - 125  57  Albumin Latest Ref Range: 3.5 - 5.0  4.4  AST Latest Ref Range: 14 - 40  15  ALT Latest Ref Range: 10 - 40  25  Bilirubin, Total Unknown 0.4  WBC Unknown 8.8  RBC Latest Ref Range: 3.87 - 5.11  3.81 (A)  Hemoglobin Latest Ref Range: 13.5 - 17.5  13.1 (A)  HCT Latest Ref Range: 41 - 53  38 (A)  MCV Latest Ref Range: 76 - 111  99  Platelets Latest Ref Range: 150 - 399  256  NEUT# Unknown 4.58    ASSESSMENT & PLAN:  Assessment/Plan:  A 69 y.o. male with metastatic gastrointestinal stromal  tumor (GIST).  There is nothing per is physical exam today which suggests his disease is progressing.  Based upon this, he will continue to take imatinib 400 mg daily.  With respect to his appetite, I will prescribe him prednisone 20 mg daily for the next few weeks, as well as Megace 800 mg daily.  Clinically, he appears to be doing well.  I will see him back in 2 months for repeat clinical assessment.  CT scans will be done a day before his next visit to ascertain his new disease baseline.  The patient understands all the plans discussed today and is in agreement with them.     Damarion Mendizabal Macarthur Critchley, MD

## 2020-08-05 ENCOUNTER — Inpatient Hospital Stay (INDEPENDENT_AMBULATORY_CARE_PROVIDER_SITE_OTHER): Payer: 59 | Admitting: Oncology

## 2020-08-05 ENCOUNTER — Inpatient Hospital Stay: Payer: 59 | Attending: Oncology

## 2020-08-05 ENCOUNTER — Other Ambulatory Visit: Payer: Self-pay

## 2020-08-05 ENCOUNTER — Telehealth: Payer: Self-pay | Admitting: Oncology

## 2020-08-05 ENCOUNTER — Other Ambulatory Visit: Payer: Self-pay | Admitting: Oncology

## 2020-08-05 VITALS — BP 112/64 | HR 91 | Temp 97.9°F | Resp 16 | Ht 72.0 in | Wt 152.7 lb

## 2020-08-05 DIAGNOSIS — C49A0 Gastrointestinal stromal tumor, unspecified site: Secondary | ICD-10-CM | POA: Diagnosis not present

## 2020-08-05 DIAGNOSIS — C221 Intrahepatic bile duct carcinoma: Secondary | ICD-10-CM

## 2020-08-05 DIAGNOSIS — C49A9 Gastrointestinal stromal tumor of other sites: Secondary | ICD-10-CM

## 2020-08-05 LAB — HEPATIC FUNCTION PANEL
ALT: 25 (ref 10–40)
AST: 15 (ref 14–40)
Alkaline Phosphatase: 57 (ref 25–125)
Bilirubin, Total: 0.4

## 2020-08-05 LAB — CBC AND DIFFERENTIAL
HCT: 38 — AB (ref 41–53)
Hemoglobin: 13.1 — AB (ref 13.5–17.5)
Neutrophils Absolute: 4.58
Platelets: 256 (ref 150–399)
WBC: 8.8

## 2020-08-05 LAB — BASIC METABOLIC PANEL
BUN: 14 (ref 4–21)
CO2: 29 — AB (ref 13–22)
Chloride: 106 (ref 99–108)
Creatinine: 0.9 (ref 0.6–1.3)
Glucose: 84
Potassium: 4.4 (ref 3.4–5.3)
Sodium: 143 (ref 137–147)

## 2020-08-05 LAB — CBC: RBC: 3.81 — AB (ref 3.87–5.11)

## 2020-08-05 LAB — COMPREHENSIVE METABOLIC PANEL
Albumin: 4.4 (ref 3.5–5.0)
Calcium: 9.4 (ref 8.7–10.7)

## 2020-08-05 NOTE — Telephone Encounter (Signed)
Per 12/21 los next appt given to patient 

## 2020-08-06 ENCOUNTER — Telehealth: Payer: Self-pay

## 2020-08-06 ENCOUNTER — Other Ambulatory Visit: Payer: Self-pay | Admitting: Hematology and Oncology

## 2020-08-06 LAB — CBC: MCV: 99 (ref 76–111)

## 2020-08-06 MED ORDER — MEGESTROL ACETATE 400 MG/10ML PO SUSP: 800.0000 mg | Freq: Every day | ORAL | 1 refills | Status: DC

## 2020-08-06 NOTE — Telephone Encounter (Addendum)
Pt notified.  ----- Message from Melodye Ped, NP sent at 08/06/2020  3:01 PM EST ----- Regarding: RE: Megace script Done  ----- Message ----- From: Dairl Ponder, RN Sent: 08/06/2020   2:27 PM EST To: Melodye Ped, NP Subject: Megace script                                  Pt called, states that Dr Bobby Rumpf was supposed to call him in something for his appetite. Dr Bobby Rumpf note isnt finished, so I called him. He states pt is right. Could you send this eRx for Dr Bobby Rumpf (per his instructions) Megace 800mg  po qd, disp 464mL bottle w/ refill 1

## 2020-08-21 NOTE — Progress Notes (Signed)
Patients daughter Efraim Kaufmann called in yesterday saying patient has been without his medication for about a week. His insurance denied coverage. Melissa, NP, did an appeal which they denied. Then also submitted a new PA. I went ahead and started the application for free drug through [employer]. I called Melissa today to speak with her about GoodRx coupons that he may be able to use until we hear back from [employer]. She let me know that patients insurance had approved the second PA. Patient was going to call in and order his medication. If they have any other problems they are going to give [place] a call. I will cancel [employer] application once I know patient was able to get mediation.

## 2020-09-19 ENCOUNTER — Telehealth: Payer: Self-pay | Admitting: Oncology

## 2020-09-19 NOTE — Telephone Encounter (Signed)
Patient's spouse called to verify 2/21 Appt

## 2020-09-22 ENCOUNTER — Other Ambulatory Visit: Payer: Self-pay | Admitting: Hematology and Oncology

## 2020-09-22 ENCOUNTER — Other Ambulatory Visit: Payer: Self-pay

## 2020-09-22 DIAGNOSIS — C481 Malignant neoplasm of specified parts of peritoneum: Secondary | ICD-10-CM

## 2020-09-22 MED ORDER — HYDROCODONE-ACETAMINOPHEN 5-325 MG PO TABS: 1.0000 | ORAL_TABLET | ORAL | 0 refills | Status: DC | PRN

## 2020-09-24 ENCOUNTER — Other Ambulatory Visit: Payer: Self-pay | Admitting: Hematology and Oncology

## 2020-09-24 DIAGNOSIS — C481 Malignant neoplasm of specified parts of peritoneum: Secondary | ICD-10-CM

## 2020-09-24 MED ORDER — HYDROCODONE-ACETAMINOPHEN 5-325 MG PO TABS: 1.0000 | ORAL_TABLET | ORAL | 0 refills | Status: DC | PRN

## 2020-09-26 DIAGNOSIS — C481 Malignant neoplasm of specified parts of peritoneum: Secondary | ICD-10-CM | POA: Insufficient documentation

## 2020-10-01 ENCOUNTER — Encounter: Payer: Self-pay | Admitting: Oncology

## 2020-10-05 NOTE — Progress Notes (Signed)
[facility]  [address] [place],  [place]  [address] [phone]  Clinic Day:  10/06/2020  Referring physician: Cyndi Bender, PA-C   HISTORY OF PRESENT ILLNESS:  The patient is a 70 y.o. male with metastatic gastrointestinal stromal tumor, including multiple tumor deposits throughout his abdomen.  Since early August 2021, the patient has been taking imatinib for his disease management.  He comes in today to go over his CT scans to ascertain his new disease baseline.  Since his last visit, the patient claims to be doing better.  He barely has any abdominal discomfort/pressure, which was the symptom which led to the workup of his GIST tumor.  He denies having other findings which concern him for overt signs of disease progression while on his imatinib.  PHYSICAL EXAM:  Blood pressure (!) 147/79, pulse 89, temperature 97.9 F (36.6 C), resp. rate 16, height 6' (1.829 m), weight 155 lb 8 oz (70.5 kg), SpO2 99 %. Wt Readings from Last 3 Encounters:  10/06/20 155 lb 8 oz (70.5 kg)  08/05/20 152 lb 11.2 oz (69.3 kg)  09/05/16 160 lb (72.6 kg)   Body mass index is 21.09 kg/m. Performance status (ECOG): 1 - Symptomatic but completely ambulatory Physical Exam Constitutional:      Appearance: Normal appearance. He is not ill-appearing.  HENT:     Mouth/Throat:     Mouth: Mucous membranes are moist.     Pharynx: Oropharynx is clear. No oropharyngeal exudate or posterior oropharyngeal erythema.  Cardiovascular:     Rate and Rhythm: Normal rate and regular rhythm.     Heart sounds: No murmur heard. No friction rub. No gallop.   Pulmonary:     Effort: Pulmonary effort is normal. No respiratory distress.     Breath sounds: Normal breath sounds. No wheezing, rhonchi or rales.  Chest:  Breasts:     Right: No axillary adenopathy or supraclavicular adenopathy.     Left: No axillary adenopathy or supraclavicular adenopathy.    Abdominal:     General:  Bowel sounds are normal. There is no distension.     Palpations: Abdomen is soft. There is no mass.     Tenderness: There is no abdominal tenderness.  Musculoskeletal:        General: No swelling.     Right lower leg: No edema.     Left lower leg: No edema.  Lymphadenopathy:     Cervical: No cervical adenopathy.     Upper Body:     Right upper body: No supraclavicular or axillary adenopathy.     Left upper body: No supraclavicular or axillary adenopathy.     Lower Body: No right inguinal adenopathy. No left inguinal adenopathy.  Skin:    General: Skin is warm.     Coloration: Skin is not jaundiced.     Findings: No lesion or rash.  Neurological:     General: No focal deficit present.     Mental Status: He is alert and oriented to person, place, and time. Mental status is at baseline.     Cranial Nerves: Cranial nerves are intact.  Psychiatric:        Mood and Affect: Mood normal.        Behavior: Behavior normal.        Thought Content: Thought content normal.   SCANS:  CT scans of his chest/abdomen/pelvis revealed the following FINDINGS: CT CHEST FINDINGS  Cardiovascular: The heart size appears within normal limits. No pericardial effusion  identified. Aortic atherosclerosis. Coronary artery calcifications.  Mediastinum/Nodes: No enlarged mediastinal, hilar, or axillary lymph nodes. Thyroid gland, trachea, and esophagus demonstrate no significant findings.  Lungs/Pleura: No pleural effusion. Advanced changes of centrilobular and paraseptal emphysema. Pulmonary nodule within the left lower lobe measures 1.6 cm, image 110/4. Unchanged from previous exam. Posterior right upper lobe lung nodule is stable measuring 4 mm.  Musculoskeletal: No chest wall mass or suspicious bone lesions identified. Prior median sternotomy. Old healed right rib fractures.  CT ABDOMEN PELVIS FINDINGS  Hepatobiliary: No focal liver abnormality is seen. No gallstones, gallbladder wall thickening,  or biliary dilatation.  Pancreas: Unremarkable. No pancreatic ductal dilatation or surrounding inflammatory changes.  Spleen: Normal in size without focal abnormality.  Adrenals/Urinary Tract: Normal appearance of the adrenal glands. Small bilateral renal hypodensities measure less than 1 cm and are technically too small to characterize. No hydronephrosis. The urinary bladder appears normal.  Stomach/Bowel: Stomach is nondistended. No bowel wall thickening, inflammation, or distension.  Vascular/Lymphatic: Aortic atherosclerosis. No aneurysm. No abdominopelvic adenopathy..  Reproductive: Prostate is unremarkable.  Other: Signs of peritoneal disease is again noted.  -index tumor within the right pericolic gutter the measures 2.2 x 5.9 cm, image 99/2. Previously 2.3 x 5.9 cm  Masslike confluence of tumor in the right posterior pelvis measures 4.4 x 7.0 cm, image 122/2. This is compared with 5.1 x 8.4 cm previously.  Left retroperitoneal tumor adjacent to the left iliopsoas muscle measures 1.9 x 3.4 cm, image 99/2. This is compared with the same previously.  No significant free fluid or fluid collections..  Musculoskeletal: No acute or significant osseous findings.  IMPRESSION: 1. Stable to slight decrease in volume of peritoneal disease. 2. Stable pulmonary nodules. 3. Aortic Atherosclerosis (ICD10-I70.0) and Emphysema (ICD10-J43.9). 4. Coronary artery calcifications.  LABS:     ASSESSMENT & PLAN:  Assessment/Plan:  A 70 y.o. male with metastatic gastrointestinal stromal tumor (GIST). In clinic today, I went over his scans with him, for which he could see there remains no evidence of disease progression.  Furthermore, when comparing his scans to what they were when he was initially diagnosed last year, his disease burden appears to be 80-90% gone, which reflects the efficacy of his imatinib.  He knows to continue his imatinib for now.  He does complain of weakness and  shortness of breath.  Of note, his scans show significant emphysema; he was strongly encouraged to abstain from smoking.  As he is more anemic, I will check his iron, B12, and folate levels and will notify him of the results when they become available.  Overall, he is clinically doing much better.  I will see him back in 4 months for repeat clinical assessment.  Scans will be done before his next visit for his continued GIST surveillance.  The patient understands all the plans discussed today and is in agreement with them.    Merci Walthers Macarthur Critchley, MD

## 2020-10-06 ENCOUNTER — Inpatient Hospital Stay: Payer: 59 | Attending: Oncology | Admitting: Oncology

## 2020-10-06 ENCOUNTER — Other Ambulatory Visit: Payer: Self-pay | Admitting: Oncology

## 2020-10-06 ENCOUNTER — Telehealth: Payer: Self-pay | Admitting: Oncology

## 2020-10-06 ENCOUNTER — Other Ambulatory Visit: Payer: Self-pay

## 2020-10-06 DIAGNOSIS — C481 Malignant neoplasm of specified parts of peritoneum: Secondary | ICD-10-CM | POA: Diagnosis not present

## 2020-10-06 DIAGNOSIS — C49A9 Gastrointestinal stromal tumor of other sites: Secondary | ICD-10-CM

## 2020-10-06 NOTE — Telephone Encounter (Signed)
Per 2/21 LOS, patient scheduled for 6/20 Labs, CT Scans, 6/21 Follow Up.  Gave patient orders/instructions - Appt Summary

## 2020-10-13 ENCOUNTER — Encounter: Payer: Self-pay | Admitting: Oncology

## 2020-12-24 ENCOUNTER — Other Ambulatory Visit: Payer: Self-pay

## 2020-12-24 DIAGNOSIS — C481 Malignant neoplasm of specified parts of peritoneum: Secondary | ICD-10-CM

## 2020-12-24 MED ORDER — HYDROCODONE-ACETAMINOPHEN 5-325 MG PO TABS: 1.0000 | ORAL_TABLET | ORAL | 0 refills | Status: DC | PRN

## 2021-01-30 NOTE — Progress Notes (Signed)
[facility]  [address] [place],  [place]  [address] [phone]  Clinic Day:  02/03/2021  Referring physician: Cyndi Bender, PA-C  This document serves as a record of services personally performed by Marice Potter, MD. It was created on their behalf by [facility], a trained medical scribe. The creation of this record is based on the scribe's personal observations and the provider's statements to them.  HISTORY OF PRESENT ILLNESS:  The patient is a 70 y.o. male with metastatic gastrointestinal stromal tumor, including multiple tumor deposits throughout his abdomen.  Since early August 2021, the patient has been taking imatinib for his disease management.  He comes in today to go over his CT scans to ascertain his new disease baseline.  Since his last visit, the patient claims to be doing fine.  He does not complain of any abdominal discomfort/pressure, which was the symptom which led to the workup of his GIST tumor.  He denies having other findings which concern him for overt signs of disease progression while on his imatinib.  His main issue today is fatigue.    PHYSICAL EXAM:  Blood pressure 123/73, pulse 84, temperature 98 F (36.7 C), resp. rate 16, height 6' (1.829 m), weight 153 lb 6.4 oz (69.6 kg), SpO2 98 %. Wt Readings from Last 3 Encounters:  02/03/21 153 lb 6.4 oz (69.6 kg)  10/06/20 155 lb 8 oz (70.5 kg)  08/05/20 152 lb 11.2 oz (69.3 kg)   Body mass index is 20.8 kg/m. Performance status (ECOG): 1 - Symptomatic but completely ambulatory Physical Exam Constitutional:      Appearance: Normal appearance. He is not ill-appearing.  HENT:     Mouth/Throat:     Mouth: Mucous membranes are moist.     Pharynx: Oropharynx is clear. No oropharyngeal exudate or posterior oropharyngeal erythema.  Cardiovascular:     Rate and Rhythm: Normal rate and regular rhythm.     Heart sounds: No murmur heard.   No friction rub. No gallop.   Pulmonary:     Effort: Pulmonary effort is normal. No respiratory distress.     Breath sounds: Normal breath sounds. No wheezing, rhonchi or rales.  Chest:  Breasts:    Right: No axillary adenopathy or supraclavicular adenopathy.     Left: No axillary adenopathy or supraclavicular adenopathy.  Abdominal:     General: Bowel sounds are normal. There is no distension.     Palpations: Abdomen is soft. There is no mass.     Tenderness: There is no abdominal tenderness.  Musculoskeletal:        General: No swelling.     Right lower leg: No edema.     Left lower leg: No edema.  Lymphadenopathy:     Cervical: No cervical adenopathy.     Upper Body:     Right upper body: No supraclavicular or axillary adenopathy.     Left upper body: No supraclavicular or axillary adenopathy.     Lower Body: No right inguinal adenopathy. No left inguinal adenopathy.  Skin:    General: Skin is warm.     Coloration: Skin is not jaundiced.     Findings: No lesion or rash.  Neurological:     General: No focal deficit present.     Mental Status: He is alert and oriented to person, place, and time. Mental status is at baseline.     Cranial Nerves: Cranial nerves are intact.  Psychiatric:  Mood and Affect: Mood normal.        Behavior: Behavior normal.        Thought Content: Thought content normal.   SCANS:  CT scans of his chest/abdomen/pelvis revealed the following:  FINDINGS: CT CHEST FINDINGS  Cardiovascular: Calcified atheromatous plaque in the thoracic aorta. Normal caliber of the thoracic aorta. Normal caliber central pulmonary vessels. Normal heart size. No pericardial effusion..  Mediastinum/Nodes: Esophagus grossly normal. No thoracic inlet lymphadenopathy. No axillary lymphadenopathy. No mediastinal lymphadenopathy. Post sternotomy as before. No hilar adenopathy.  Lungs/Pleura: Lobulated pulmonary nodule in the LEFT lung base measures 16 x 13 mm, unchanged. Paraseptal and  centrilobular emphysema moderate to marked and worse at the lung apices. Airways are patent. No effusion. No consolidation.  Musculoskeletal: See below for full musculoskeletal detail.  CT ABDOMEN PELVIS FINDINGS  Hepatobiliary: No focal, suspicious hepatic lesion. No pericholecystic stranding. No biliary duct dilation.  Pancreas: Normal, without mass, inflammation or ductal dilatation.  Spleen: Spleen normal size and contour.  Adrenals/Urinary Tract: Adrenal glands are normal.  Symmetric renal enhancement. Small low-density lesion in the interpolar RIGHT kidney likely a cyst. No hydronephrosis. No nephrolithiasis. No suspicious renal lesion with tiny low-density lesion in the interpolar LEFT kidney as well.  Stomach/Bowel: No acute gastrointestinal process. Tumor adjacent to the RIGHT colon, see below. Paucity of intra-abdominal and retroperitoneal fat does limit assessment.  Vascular/Lymphatic: Calcified and noncalcified atheromatous plaque of the abdominal aorta extending into iliac vessels. Scattered small retroperitoneal lymph nodes show a similar appearance compared to previous imaging.  No adenopathy in the pelvis.  Reproductive: Unremarkable.  Other: Tumor in the RIGHT retroperitoneum adjacent to the colon measuring 4.9 cm x 2.5 cm previously approximately 5.8 x 2.2 cm (image 94/2)  Tumor along the RIGHT hepatic margin measures 13 mm greatest thickness (image 86/2) previously 13 mm.  Tumor in the LEFT retroperitoneum anterior to LEFT psoas 1.3 x 2.2 cm previously 3.4 x 1.9 cm as the largest area in continuity with this abnormality, 9 mm focus in the deeper LEFT retroperitoneum is all that remains of the dominant portion of this soft tissue that was seen on the previous study.  Masslike area in the RIGHT pelvis measuring 6.0 x 4.1 cm previously 7.0 x 4.4 cm. No ascites. Smaller nodular areas along the LEFT paracolic gutter are similar to the previous  study.  Musculoskeletal: No acute musculoskeletal process. No destructive bone finding.  IMPRESSION: 1. Continued slight decrease in size in volume of peritoneal disease. 2. Stable appearance of lobulated LEFT lower lobe pulmonary nodule. 3. No new or otherwise progressive findings. 4. Paraseptal and centrilobular emphysema moderate to marked and worse at the lung apices. 5. Aortic atherosclerosis. 6. Coronary artery disease  Aortic Atherosclerosis (ICD10-I70.0) and Emphysema (ICD10-J43.9).   LABS:       ASSESSMENT & PLAN:  Assessment/Plan:  A 70 y.o. male with metastatic gastrointestinal stromal tumor (GIST). In clinic today, I went over his scans with him, for which he could see there remains no evidence of disease progression.  Furthermore, when comparing his scans to what they were when he was initially diagnosed last year, his disease burden appears to be 90% gone, which reflects the continued efficacy of his imatinib.  He knows to continue his imatinib for now.  With respect to his fatigue, recent vitamin B12 levels came back low.  Based upon this, I will place him on a protracted course of B12 injections, which will include 1000 mcg daily x 7 days, then weekly  x 4 weeks, then monthly indefinitely.  Despite his fatigue, his hemoglobin is still 13.  Overall, he is doing well.  I will see him back in another 4 months for repeat clinical assessment.  Scans will be done before his next visit for his continued GIST surveillance.  The patient understands all the plans discussed today and is in agreement with them.     I, Rita Ohara, am acting as scribe for Marice Potter, MD    I have reviewed this report as typed by the medical scribe, and it is complete and accurate.  Dequincy Macarthur Critchley, MD

## 2021-02-02 ENCOUNTER — Encounter: Payer: Self-pay | Admitting: Hematology and Oncology

## 2021-02-02 DIAGNOSIS — J432 Centrilobular emphysema: Secondary | ICD-10-CM | POA: Diagnosis not present

## 2021-02-02 DIAGNOSIS — K661 Hemoperitoneum: Secondary | ICD-10-CM | POA: Diagnosis not present

## 2021-02-02 DIAGNOSIS — D492 Neoplasm of unspecified behavior of bone, soft tissue, and skin: Secondary | ICD-10-CM | POA: Diagnosis not present

## 2021-02-02 DIAGNOSIS — M545 Low back pain, unspecified: Secondary | ICD-10-CM | POA: Diagnosis not present

## 2021-02-02 DIAGNOSIS — I2510 Atherosclerotic heart disease of native coronary artery without angina pectoris: Secondary | ICD-10-CM | POA: Diagnosis not present

## 2021-02-02 DIAGNOSIS — C481 Malignant neoplasm of specified parts of peritoneum: Secondary | ICD-10-CM | POA: Diagnosis not present

## 2021-02-02 DIAGNOSIS — I700 Atherosclerosis of aorta: Secondary | ICD-10-CM | POA: Diagnosis not present

## 2021-02-02 LAB — BASIC METABOLIC PANEL
BUN: 14 (ref 4–21)
CO2: 27 — AB (ref 13–22)
Chloride: 105 (ref 99–108)
Creatinine: 0.8 (ref 0.6–1.3)
Glucose: 93
Potassium: 4.3 (ref 3.4–5.3)
Sodium: 138 (ref 137–147)

## 2021-02-02 LAB — CBC AND DIFFERENTIAL
HCT: 35 — AB (ref 41–53)
Hemoglobin: 12.4 — AB (ref 13.5–17.5)
Neutrophils Absolute: 4.24
Platelets: 192 (ref 150–399)
WBC: 7.7

## 2021-02-02 LAB — COMPREHENSIVE METABOLIC PANEL
Albumin: 4.2 (ref 3.5–5.0)
Calcium: 8.8 (ref 8.7–10.7)

## 2021-02-02 LAB — HEPATIC FUNCTION PANEL
ALT: 13 (ref 10–40)
AST: 25 (ref 14–40)
Alkaline Phosphatase: 48 (ref 25–125)
Bilirubin, Total: 0.6

## 2021-02-02 LAB — CBC: RBC: 3.59 — AB (ref 3.87–5.11)

## 2021-02-03 ENCOUNTER — Other Ambulatory Visit: Payer: Self-pay | Admitting: Oncology

## 2021-02-03 ENCOUNTER — Inpatient Hospital Stay: Payer: Medicare HMO | Attending: Oncology | Admitting: Oncology

## 2021-02-03 VITALS — BP 123/73 | HR 84 | Temp 98.0°F | Resp 16 | Ht 72.0 in | Wt 153.4 lb

## 2021-02-03 DIAGNOSIS — E538 Deficiency of other specified B group vitamins: Secondary | ICD-10-CM

## 2021-02-03 DIAGNOSIS — C481 Malignant neoplasm of specified parts of peritoneum: Secondary | ICD-10-CM

## 2021-02-03 DIAGNOSIS — C49A9 Gastrointestinal stromal tumor of other sites: Secondary | ICD-10-CM

## 2021-02-04 ENCOUNTER — Encounter: Payer: Self-pay | Admitting: Oncology

## 2021-03-02 DIAGNOSIS — R630 Anorexia: Secondary | ICD-10-CM | POA: Diagnosis not present

## 2021-03-02 DIAGNOSIS — Z79899 Other long term (current) drug therapy: Secondary | ICD-10-CM | POA: Diagnosis not present

## 2021-03-02 DIAGNOSIS — C228 Malignant neoplasm of liver, primary, unspecified as to type: Secondary | ICD-10-CM | POA: Diagnosis not present

## 2021-03-02 DIAGNOSIS — Z720 Tobacco use: Secondary | ICD-10-CM | POA: Diagnosis not present

## 2021-03-02 DIAGNOSIS — J449 Chronic obstructive pulmonary disease, unspecified: Secondary | ICD-10-CM | POA: Diagnosis not present

## 2021-03-02 DIAGNOSIS — G63 Polyneuropathy in diseases classified elsewhere: Secondary | ICD-10-CM | POA: Diagnosis not present

## 2021-03-02 DIAGNOSIS — G8929 Other chronic pain: Secondary | ICD-10-CM | POA: Diagnosis not present

## 2021-03-02 DIAGNOSIS — Z8249 Family history of ischemic heart disease and other diseases of the circulatory system: Secondary | ICD-10-CM | POA: Diagnosis not present

## 2021-03-02 DIAGNOSIS — D630 Anemia in neoplastic disease: Secondary | ICD-10-CM | POA: Diagnosis not present

## 2021-03-02 DIAGNOSIS — Z5948 Other specified lack of adequate food: Secondary | ICD-10-CM | POA: Diagnosis not present

## 2021-03-10 DIAGNOSIS — Z6821 Body mass index (BMI) 21.0-21.9, adult: Secondary | ICD-10-CM | POA: Diagnosis not present

## 2021-03-10 DIAGNOSIS — J449 Chronic obstructive pulmonary disease, unspecified: Secondary | ICD-10-CM | POA: Diagnosis not present

## 2021-03-10 DIAGNOSIS — U071 COVID-19: Secondary | ICD-10-CM | POA: Diagnosis not present

## 2021-03-10 DIAGNOSIS — C49A2 Gastrointestinal stromal tumor of stomach: Secondary | ICD-10-CM | POA: Diagnosis not present

## 2021-03-18 ENCOUNTER — Other Ambulatory Visit: Payer: Self-pay

## 2021-03-18 DIAGNOSIS — R102 Pelvic and perineal pain: Secondary | ICD-10-CM

## 2021-03-18 DIAGNOSIS — C481 Malignant neoplasm of specified parts of peritoneum: Secondary | ICD-10-CM

## 2021-03-18 DIAGNOSIS — R109 Unspecified abdominal pain: Secondary | ICD-10-CM

## 2021-03-18 MED ORDER — HYDROCODONE-ACETAMINOPHEN 5-325 MG PO TABS: 1.0000 | ORAL_TABLET | ORAL | 0 refills | Status: DC | PRN

## 2021-04-19 ENCOUNTER — Other Ambulatory Visit: Payer: Self-pay | Admitting: Hematology and Oncology

## 2021-04-19 DIAGNOSIS — R630 Anorexia: Secondary | ICD-10-CM

## 2021-06-01 ENCOUNTER — Other Ambulatory Visit: Payer: Self-pay | Admitting: Hematology and Oncology

## 2021-06-01 DIAGNOSIS — R630 Anorexia: Secondary | ICD-10-CM

## 2021-06-01 DIAGNOSIS — C49A0 Gastrointestinal stromal tumor, unspecified site: Secondary | ICD-10-CM

## 2021-06-02 NOTE — Progress Notes (Signed)
[facility]  [address] [place],  [place]  [address] [phone]  Clinic Day:  06/05/2021  Referring physician: Cyndi Bender, PA-C  This document serves as a record of services personally performed by Marice Potter, MD. It was created on their behalf by [facility], a trained medical scribe. The creation of this record is based on the scribe's personal observations and the provider's statements to them.  HISTORY OF PRESENT ILLNESS:  The patient is a 71 y.o. male with metastatic gastrointestinal stromal tumor, including multiple tumor deposits throughout his abdomen.  Since early August 2021, the patient has been taking imatinib, which has been effective with his disease management.  He comes in today to go over his most recent CT scans to ascertain his new disease baseline.  Since his last visit, the patient claims to be doing fine.  He has right-sided abdominal discomfort, which is a known region of where some of his metastatic disease was located.  He denies having other findings which concern him for overt signs of disease progression while on his imatinib.    PHYSICAL EXAM:  Blood pressure (!) 155/74, pulse 99, temperature 98.2 F (36.8 C), resp. rate 20, height 6' (1.829 m), weight 177 lb 1.6 oz (80.3 kg), SpO2 99 %. Wt Readings from Last 3 Encounters:  06/05/21 177 lb 1.6 oz (80.3 kg)  02/03/21 153 lb 6.4 oz (69.6 kg)  10/06/20 155 lb 8 oz (70.5 kg)   Body mass index is 24.02 kg/m. Performance status (ECOG): 1 - Symptomatic but completely ambulatory Physical Exam Constitutional:      Appearance: Normal appearance. He is not ill-appearing.  HENT:     Mouth/Throat:     Mouth: Mucous membranes are moist.     Pharynx: Oropharynx is clear. No oropharyngeal exudate or posterior oropharyngeal erythema.  Cardiovascular:     Rate and Rhythm: Normal rate and regular rhythm.     Heart sounds: No murmur heard.   No friction rub. No gallop.   Pulmonary:     Effort: Pulmonary effort is normal. No respiratory distress.     Breath sounds: Normal breath sounds. No wheezing, rhonchi or rales.  Abdominal:     General: Bowel sounds are normal. There is no distension.     Palpations: Abdomen is soft. There is no mass.     Tenderness: There is no abdominal tenderness.  Musculoskeletal:        General: No swelling.     Right lower leg: No edema.     Left lower leg: No edema.  Lymphadenopathy:     Cervical: No cervical adenopathy.     Upper Body:     Right upper body: No supraclavicular or axillary adenopathy.     Left upper body: No supraclavicular or axillary adenopathy.     Lower Body: No right inguinal adenopathy. No left inguinal adenopathy.  Skin:    General: Skin is warm.     Coloration: Skin is not jaundiced.     Findings: No lesion or rash.  Neurological:     General: No focal deficit present.     Mental Status: He is alert and oriented to person, place, and time. Mental status is at baseline.     Cranial Nerves: Cranial nerves are intact.  Psychiatric:        Mood and Affect: Mood normal.        Behavior: Behavior normal.        Thought Content: Thought content  normal.   SCANS: CT chest, abdomen and pelvis with contrast has revealed the following:  FINDINGS:  CT CHEST FINDINGS Cardiovascular: Atherosclerosis in the aortic arch and left anterior descending coronary artery. Stable mild anterior prominence of the right ventricle. Mediastinum/Nodes: AP window lymph node 0.8 cm in short axis on image 28 series 2, previously 0.9 cm. Small distal paraesophageal lymph node 0.5 cm in short axis on image 59 series 2. Lungs/Pleura: Biapical pleuroparenchymal scarring. Centrilobular and paraseptal emphysema. 3 mm right upper lobe nodule posteriorly on image 51 of series 4, stable. Mild scarring or atelectasis inferiorly in the lingula. 16 by 12 by 14 mm (volume = 1400 mm^3) left lower lobe nodule on image 101 series 4,  formerly measuring 17 by 13 by 15 mm (volume = 1700 mm^3) on 02/02/2021 by my measurements. Musculoskeletal: Prior median sternotomy. Suspected hemangioma eccentric to the right at the T5 vertebral level.  CT ABDOMEN PELVIS FINDINGS Hepatobiliary: Contracted gallbladder. No significant hepatic parenchymal lesion identified. No biliary dilatation. Pancreas: Unremarkable Spleen: Unremarkable Adrenals/Urinary Tract: Stable 7 mm hypodense lesion in the right mid kidney on image 82 series 2, technically too small to characterize although statistically likely to be a cyst. Similar small nonspecific 5 mm lesion in the left mid kidney on image 81 series 2, unchanged. Urinary bladder unremarkable. Stomach/Bowel: Unremarkable Vascular/Lymphatic: Atherosclerosis is present, including aortoiliac atherosclerotic disease. Small retroperitoneal lymph nodes are not pathologically enlarged by size criteria. Reproductive: Unremarkable Other: Tumor noted along the margin of the liver, likely near the posterior dome of the liver but particularly along the inferior margin of the right hepatic lobe and in the right paracolic gutter region. Tumor measures about 1.1 cm in short axis thickness on image 86 series 2 along the inferior margin of the right hepatic lobe, previously the same by my measurements. There is tumor in the right paracolic gutter tracking posterior to the ascending colon, measuring about 1.8 cm in thickness on image 97 series 2, previously 2.1 cm. Subjectively the volume in this location is minimally reduced. There is a band of tumor tracking along a small bowel loop just above the urinary bladder eccentric to the left, this band of tumor measures about 1.6 cm in thickness on image 112 series 2, formerly 1.7 cm. Tumor between the bladder and the rectum measures up to 3.0 cm craniocaudad on image 63 series 602, previously 4.0 cm in this location. Other small scattered foci of tumor  along peritoneal surfaces and mesentery. Musculoskeletal: Anterior hernia mesh noted in pelvis. Small umbilical hernia contains adipose and fluid density. There is substantial degenerative disc disease at L4-5 likely causing central narrowing of the thecal sac with a disc protrusion or extrusion extending caudad in the anterior extradural space, and associated with some nitrogen gas phenomenon on image 70 series 602. There is spondylosis and degenerative disc disease at L5-S1 contributing to borderline bilateral foraminal impingement at this level.  IMPRESSION: 1. Various tumor deposits along peritoneal surfaces in the abdomen and pelvis are again noted although with mildly reduced size compared to 02/02/2021. 2. The left lower lobe nodule is likewise mildly reduced in size compared to prior. 3. Other imaging findings of potential clinical significance: Aortic Atherosclerosis (ICD10-I70.0). Coronary atherosclerosis. Mild scarring or atelectasis inferiorly in the lingula. Emphysema (ICD10-J43.9). Disc herniation extending caudad from the L4-5 level potentially contributing to central narrowing of the thecal sac  LABS:    Ref. Range 06/04/2021 00:00  Sodium Latest Ref Range: 137 - 147  138  Potassium  Latest Ref Range: 3.4 - 5.3  4.5  Chloride Latest Ref Range: 99 - 108  105  CO2 Latest Ref Range: 13 - 22  26 (A)  Glucose Unknown 102  BUN Latest Ref Range: 4 - 21  23 (A)  Creatinine Latest Ref Range: 0.6 - 1.3  1.1  Calcium Latest Ref Range: 8.7 - 10.7  8.7  Alkaline Phosphatase Latest Ref Range: 25 - 125  47  Albumin Latest Ref Range: 3.5 - 5.0  4.1  AST Latest Ref Range: 14 - 40  28  ALT Latest Ref Range: 10 - 40  23  Bilirubin, Total Unknown 0.3  WBC Unknown 10.1  RBC Latest Ref Range: 3.87 - 5.11  3.55 (A)  Hemoglobin Latest Ref Range: 13.5 - 17.5  12.4 (A)  HCT Latest Ref Range: 41 - 53  37 (A)  Platelets Latest Ref Range: 150 - 399  278  NEUT# Unknown 6.26     ASSESSMENT & PLAN:  Assessment/Plan:  A 70 y.o. male with metastatic gastrointestinal stromal tumor (GIST).  In clinic today, I went over his CT scan images with him, for which he could see that his metastatic disease deposits remain under ideal control.  He knows to continue taking his imatinib on a daily basis.  Overall, he is doing well.  I will see him back in 6 months for repeat clinical assessment.  Scans will be done before his next visit for his continued GIST surveillance.  The patient understands all the plans discussed today and is in agreement with them.     I, Rita Ohara, am acting as scribe for Marice Potter, MD    I have reviewed this report as typed by the medical scribe, and it is complete and accurate.  Chip Canepa Macarthur Critchley, MD

## 2021-06-03 ENCOUNTER — Other Ambulatory Visit: Payer: Self-pay | Admitting: Oncology

## 2021-06-04 ENCOUNTER — Encounter: Payer: Self-pay | Admitting: Oncology

## 2021-06-04 DIAGNOSIS — I2510 Atherosclerotic heart disease of native coronary artery without angina pectoris: Secondary | ICD-10-CM | POA: Diagnosis not present

## 2021-06-04 DIAGNOSIS — C49A9 Gastrointestinal stromal tumor of other sites: Secondary | ICD-10-CM | POA: Diagnosis not present

## 2021-06-04 DIAGNOSIS — M47817 Spondylosis without myelopathy or radiculopathy, lumbosacral region: Secondary | ICD-10-CM | POA: Diagnosis not present

## 2021-06-04 DIAGNOSIS — Q219 Congenital malformation of cardiac septum, unspecified: Secondary | ICD-10-CM | POA: Diagnosis not present

## 2021-06-04 DIAGNOSIS — J432 Centrilobular emphysema: Secondary | ICD-10-CM | POA: Diagnosis not present

## 2021-06-04 DIAGNOSIS — K429 Umbilical hernia without obstruction or gangrene: Secondary | ICD-10-CM | POA: Diagnosis not present

## 2021-06-04 DIAGNOSIS — C786 Secondary malignant neoplasm of retroperitoneum and peritoneum: Secondary | ICD-10-CM | POA: Diagnosis not present

## 2021-06-04 DIAGNOSIS — K820 Obstruction of gallbladder: Secondary | ICD-10-CM | POA: Diagnosis not present

## 2021-06-04 DIAGNOSIS — C49A0 Gastrointestinal stromal tumor, unspecified site: Secondary | ICD-10-CM | POA: Diagnosis not present

## 2021-06-04 LAB — BASIC METABOLIC PANEL
BUN: 23 — AB (ref 4–21)
CO2: 26 — AB (ref 13–22)
Chloride: 105 (ref 99–108)
Creatinine: 1.1 (ref 0.6–1.3)
Glucose: 102
Potassium: 4.5 (ref 3.4–5.3)
Sodium: 138 (ref 137–147)

## 2021-06-04 LAB — COMPREHENSIVE METABOLIC PANEL
Albumin: 4.1 (ref 3.5–5.0)
Calcium: 8.7 (ref 8.7–10.7)

## 2021-06-04 LAB — HEPATIC FUNCTION PANEL
ALT: 23 (ref 10–40)
AST: 28 (ref 14–40)
Alkaline Phosphatase: 47 (ref 25–125)
Bilirubin, Total: 0.3

## 2021-06-04 LAB — CBC AND DIFFERENTIAL
HCT: 37 — AB (ref 41–53)
Hemoglobin: 12.4 — AB (ref 13.5–17.5)
Neutrophils Absolute: 6.26
Platelets: 278 (ref 150–399)
WBC: 10.1

## 2021-06-04 LAB — CBC: RBC: 3.55 — AB (ref 3.87–5.11)

## 2021-06-05 ENCOUNTER — Telehealth: Payer: Self-pay | Admitting: Oncology

## 2021-06-05 ENCOUNTER — Inpatient Hospital Stay: Payer: 59 | Attending: Oncology | Admitting: Oncology

## 2021-06-05 ENCOUNTER — Other Ambulatory Visit: Payer: Self-pay | Admitting: Oncology

## 2021-06-05 VITALS — BP 155/74 | HR 99 | Temp 98.2°F | Resp 20 | Ht 72.0 in | Wt 177.1 lb

## 2021-06-05 DIAGNOSIS — C49A9 Gastrointestinal stromal tumor of other sites: Secondary | ICD-10-CM

## 2021-06-05 DIAGNOSIS — C481 Malignant neoplasm of specified parts of peritoneum: Secondary | ICD-10-CM | POA: Diagnosis not present

## 2021-06-05 NOTE — Telephone Encounter (Signed)
Per 10/21 los next appt scheduled and given to patient

## 2021-06-08 ENCOUNTER — Other Ambulatory Visit: Payer: Self-pay | Admitting: Hematology and Oncology

## 2021-06-08 DIAGNOSIS — C481 Malignant neoplasm of specified parts of peritoneum: Secondary | ICD-10-CM

## 2021-06-08 DIAGNOSIS — R102 Pelvic and perineal pain: Secondary | ICD-10-CM

## 2021-06-08 MED ORDER — HYDROCODONE-ACETAMINOPHEN 5-325 MG PO TABS: 1.0000 | ORAL_TABLET | ORAL | 0 refills | Status: DC | PRN

## 2021-06-17 ENCOUNTER — Other Ambulatory Visit: Payer: Self-pay

## 2021-07-31 ENCOUNTER — Encounter: Payer: Self-pay | Admitting: Oncology

## 2021-08-25 ENCOUNTER — Other Ambulatory Visit: Payer: Self-pay | Admitting: Hematology and Oncology

## 2021-08-25 DIAGNOSIS — R102 Pelvic and perineal pain: Secondary | ICD-10-CM

## 2021-08-25 DIAGNOSIS — R630 Anorexia: Secondary | ICD-10-CM

## 2021-08-25 DIAGNOSIS — C481 Malignant neoplasm of specified parts of peritoneum: Secondary | ICD-10-CM

## 2021-08-25 DIAGNOSIS — C49A0 Gastrointestinal stromal tumor, unspecified site: Secondary | ICD-10-CM

## 2021-08-25 MED ORDER — MEGESTROL ACETATE 40 MG/ML PO SUSP: ORAL | 1 refills | Status: DC

## 2021-08-25 MED ORDER — HYDROCODONE-ACETAMINOPHEN 5-325 MG PO TABS: 1.0000 | ORAL_TABLET | ORAL | 0 refills | Status: DC | PRN

## 2021-10-27 ENCOUNTER — Other Ambulatory Visit: Payer: Self-pay | Admitting: Hematology and Oncology

## 2021-10-27 ENCOUNTER — Telehealth: Payer: Self-pay

## 2021-10-27 DIAGNOSIS — R102 Pelvic and perineal pain: Secondary | ICD-10-CM

## 2021-10-27 DIAGNOSIS — C481 Malignant neoplasm of specified parts of peritoneum: Secondary | ICD-10-CM

## 2021-10-27 MED ORDER — HYDROCODONE-ACETAMINOPHEN 5-325 MG PO TABS: 1.0000 | ORAL_TABLET | ORAL | 0 refills | Status: DC | PRN

## 2021-10-27 MED ORDER — HYDROCODONE-ACETAMINOPHEN 10-325 MG PO TABS: 0.5000 | ORAL_TABLET | ORAL | 0 refills | Status: DC | PRN

## 2021-10-27 NOTE — Telephone Encounter (Signed)
Patient's wife called stating he needed pain med refilled and while she was on the phone states that he has been slobbering on the left side of his face and bil lower extremity edema.  Per Melissa P, take patient to ED for eval.  Spouse states that she will tell him. ?

## 2021-12-03 ENCOUNTER — Other Ambulatory Visit: Payer: Self-pay | Admitting: Oncology

## 2021-12-04 ENCOUNTER — Other Ambulatory Visit: Payer: Self-pay | Admitting: Oncology

## 2021-12-04 ENCOUNTER — Inpatient Hospital Stay: Payer: Medicare HMO | Attending: Oncology | Admitting: Oncology

## 2021-12-04 VITALS — BP 128/69 | HR 86 | Temp 98.2°F | Resp 16 | Ht 72.0 in | Wt 207.6 lb

## 2021-12-04 DIAGNOSIS — C481 Malignant neoplasm of specified parts of peritoneum: Secondary | ICD-10-CM

## 2021-12-05 NOTE — Progress Notes (Signed)
?[facility]  ?[address] ?[place],  [place]  [address] ?[phone] ? ?Clinic Day:  12/04/2020 ? ?Referring physician: Cyndi Bender, PA-C ? ?HISTORY OF PRESENT ILLNESS:  ?The patient is a 71 y.o. male with metastatic gastrointestinal stromal tumor, including multiple tumor deposits throughout his abdomen.  Since early August 2021, the patient has been taking imatinib, which has been effective with his disease management.  He comes in today to go over his most recent CT scans to ascertain his new disease baseline.  Since his last visit, the patient claims to be doing fine.  He has occasional right-sided abdominal discomfort, which is a known region of where some of his metastatic disease was located.  He denies having other findings which concern him for overt signs of disease progression while on his imatinib.   ? ?PHYSICAL EXAM:  ?Blood pressure 128/69, pulse 86, temperature 98.2 ?F (36.8 ?C), resp. rate 16, height 6' (1.829 m), weight 207 lb 9.6 oz (94.2 kg), SpO2 98 %. ?Wt Readings from Last 3 Encounters:  ?12/04/21 207 lb 9.6 oz (94.2 kg)  ?06/05/21 177 lb 1.6 oz (80.3 kg)  ?02/03/21 153 lb 6.4 oz (69.6 kg)  ? ?Body mass index is 28.16 kg/m?Marland Kitchen ?Performance status (ECOG): 1 - Symptomatic but completely ambulatory ?Physical Exam ?Constitutional:   ?   Appearance: Normal appearance. He is not ill-appearing.  ?HENT:  ?   Mouth/Throat:  ?   Mouth: Mucous membranes are moist.  ?   Pharynx: Oropharynx is clear. No oropharyngeal exudate or posterior oropharyngeal erythema.  ?Cardiovascular:  ?   Rate and Rhythm: Normal rate and regular rhythm.  ?   Heart sounds: No murmur heard. ?  No friction rub. No gallop.  ?Pulmonary:  ?   Effort: Pulmonary effort is normal. No respiratory distress.  ?   Breath sounds: Normal breath sounds. No wheezing, rhonchi or rales.  ?Abdominal:  ?   General: Bowel sounds are normal. There is no distension.  ?   Palpations: Abdomen is soft. There is no  mass.  ?   Tenderness: There is no abdominal tenderness.  ?Musculoskeletal:     ?   General: No swelling.  ?   Right lower leg: No edema.  ?   Left lower leg: No edema.  ?Lymphadenopathy:  ?   Cervical: No cervical adenopathy.  ?   Upper Body:  ?   Right upper body: No supraclavicular or axillary adenopathy.  ?   Left upper body: No supraclavicular or axillary adenopathy.  ?   Lower Body: No right inguinal adenopathy. No left inguinal adenopathy.  ?Skin: ?   General: Skin is warm.  ?   Coloration: Skin is not jaundiced.  ?   Findings: No lesion or rash.  ?Neurological:  ?   General: No focal deficit present.  ?   Mental Status: He is alert and oriented to person, place, and time. Mental status is at baseline.  ?Psychiatric:     ?   Mood and Affect: Mood normal.     ?   Behavior: Behavior normal.     ?   Thought Content: Thought content normal.  ? ?SCANS: CT scans of his chest, abdomen and pelvis revealed the following: ?FINDINGS:  ?CT CHEST FINDINGS  ?Cardiovascular: Normal heart size. No pericardial effusion. Left  ?main and LAD coronary artery calcifications. Atherosclerotic disease  ?of the thoracic aorta.  ?Mediastinum/Nodes: Esophagus and thyroid are unremarkable. No  ?pathologically enlarged lymph nodes seen  in the chest.  ?Lungs/Pleura: Central airways are patent. Severe centrilobular and  ?paraseptal emphysema. No consolidation, pleural effusion or  ?pneumothorax. Solid pulmonary nodule of the left lower lobe  ?measuring 1.6 x 1.3 cm on series 301, image 94, unchanged in size  ?when compared with prior exam.  ?Musculoskeletal: No chest wall mass or suspicious bone lesions  ?identified.  ? ?CT ABDOMEN PELVIS FINDINGS  ?Hepatobiliary: No focal liver abnormality is seen. No gallstones,  ?gallbladder wall thickening, or biliary dilatation.  ?Pancreas: Unremarkable. No pancreatic ductal dilatation or  ?surrounding inflammatory changes.  ?Spleen: Normal in size without focal abnormality.  ?Adrenals/Urinary Tract:  Adrenal glands are unremarkable. Kidneys are  ?normal, without renal calculi, focal lesion, or hydronephrosis.  ?Bladder is unremarkable.  ?Stomach/Bowel: Stomach is within normal limits. Appendix appears  ?normal. No evidence of bowel wall thickening, distention, or  ?inflammatory changes.  ?Vascular/Lymphatic: Aortic atherosclerosis. No enlarged abdominal or  ?pelvic lymph nodes.  ?Reproductive: Prostate is unremarkable.  ?Other: Large peritoneal implant of the right lower quadrant is  ?increased in size when compared to prior exam. Right lower quadrant  ?peritoneal mass measures 3.9 x 3.0 cm on series 2, image 97,  ?previously measured 1.8 x 2.3 cm. Other smaller peritoneal implants  ?are stable. Reference soft tissue peritoneal implant located along  ?the right paracolic gutter measures 2.3 x 0.9 cm on series 2, image  ?84 unchanged in size when compared with prior exam.  ?Musculoskeletal: No acute or significant osseous findings.  ? ?IMPRESSION:  ?1. Large peritoneal mass of the right lower quadrant is increased in  ?size when compared to prior exam. Other smaller peritoneal implants  ?are stable.  ?2. Stable left lower lobe solid pulmonary nodule.  ?3. Aortic Atherosclerosis (ICD10-I70.0) and Emphysema (ICD10-J43.9).  ? ?LABS:  ? ? ? ? ?ASSESSMENT & PLAN:  ?Assessment/Plan:  A 71 y.o. male with metastatic gastrointestinal stromal tumor (GIST).  In clinic today, I went over his CT scan images with him, for which he could see that his metastatic disease deposits remain mostly under ideal control.  There is one peritoneal deposit in his right lower quadrant that appears larger than previously.  However, it has not progressed severely enough to where I think a change in therapy is warranted at this time.  Clinically, the patient is doing well and appears to be unaffected by his disease.  Since his last visit, he has gained 30 pounds.  I will see him back in 4 months for repeat clinical assessment.  Scans will be  done a day before his next visit to reassess his new disease baseline.  The patient understands all the plans discussed today and is in agreement with them.   ? ?Cherril Hett Macarthur Critchley, MD ? ? ? ?  ?

## 2021-12-06 ENCOUNTER — Other Ambulatory Visit: Payer: Self-pay | Admitting: Hematology and Oncology

## 2021-12-06 DIAGNOSIS — C49A0 Gastrointestinal stromal tumor, unspecified site: Secondary | ICD-10-CM

## 2021-12-06 DIAGNOSIS — R630 Anorexia: Secondary | ICD-10-CM

## 2021-12-09 ENCOUNTER — Other Ambulatory Visit: Payer: Self-pay | Admitting: Oncology

## 2021-12-09 DIAGNOSIS — R630 Anorexia: Secondary | ICD-10-CM

## 2021-12-09 DIAGNOSIS — C49A0 Gastrointestinal stromal tumor, unspecified site: Secondary | ICD-10-CM

## 2021-12-09 MED ORDER — MEGESTROL ACETATE 40 MG/ML PO SUSP: ORAL | 1 refills | Status: DC

## 2021-12-11 ENCOUNTER — Encounter: Payer: Self-pay | Admitting: Oncology

## 2021-12-21 ENCOUNTER — Other Ambulatory Visit: Payer: Self-pay | Admitting: Hematology and Oncology

## 2021-12-21 MED ORDER — HYDROCODONE-ACETAMINOPHEN 10-325 MG PO TABS: 0.5000 | ORAL_TABLET | ORAL | 0 refills | Status: DC | PRN

## 2022-02-19 ENCOUNTER — Other Ambulatory Visit: Payer: Self-pay | Admitting: Hematology and Oncology

## 2022-02-19 MED ORDER — HYDROCODONE-ACETAMINOPHEN 10-325 MG PO TABS
0.5000 | ORAL_TABLET | ORAL | 0 refills | Status: DC | PRN
Start: 2022-02-19 — End: 2022-02-19

## 2022-02-19 MED ORDER — OXYCODONE-ACETAMINOPHEN 7.5-325 MG PO TABS: 1.0000 | ORAL_TABLET | ORAL | 0 refills | Status: DC | PRN

## 2022-02-23 ENCOUNTER — Other Ambulatory Visit: Payer: Self-pay | Admitting: Hematology and Oncology

## 2022-03-15 ENCOUNTER — Other Ambulatory Visit: Payer: Self-pay

## 2022-03-16 ENCOUNTER — Ambulatory Visit: Payer: 59 | Admitting: Hematology and Oncology

## 2022-03-16 ENCOUNTER — Other Ambulatory Visit: Payer: 59

## 2022-03-16 ENCOUNTER — Telehealth: Payer: Self-pay

## 2022-03-16 ENCOUNTER — Other Ambulatory Visit: Payer: Self-pay | Admitting: Hematology and Oncology

## 2022-03-16 MED ORDER — OXYCODONE HCL 10 MG PO TABS: 10.0000 mg | ORAL_TABLET | ORAL | 0 refills | Status: DC | PRN

## 2022-03-16 NOTE — Telephone Encounter (Signed)
Pt walked into clinic to give information to staff. He was seen in ER @ [facility] today for left lower sided pain radiating to his abdomen. They ordered a CT abdomen and pt states, "they say I have another spot growing. I hope they are wrong". Pt brought a CD copy of the CT abd with him and I gave to [facility] P,NP. She will get someone to help her with pulling the scan up with the CD, or maybe getting the report, and notify Dr Bobby Rumpf if need be, for recommendations. She is increasing his pain medication and eRx to [facility].  Pt did mention they gave him shot of Morphine in ER that "helped me more than anything in a long time".

## 2022-03-17 ENCOUNTER — Other Ambulatory Visit: Payer: Self-pay

## 2022-03-18 ENCOUNTER — Other Ambulatory Visit: Payer: Self-pay | Admitting: Oncology

## 2022-03-18 DIAGNOSIS — R630 Anorexia: Secondary | ICD-10-CM

## 2022-03-18 DIAGNOSIS — C49A0 Gastrointestinal stromal tumor, unspecified site: Secondary | ICD-10-CM

## 2022-03-19 ENCOUNTER — Other Ambulatory Visit: Payer: Self-pay | Admitting: Hematology and Oncology

## 2022-03-19 ENCOUNTER — Other Ambulatory Visit: Payer: Self-pay

## 2022-03-19 MED ORDER — HYDROCODONE-ACETAMINOPHEN 10-325 MG PO TABS: 1.0000 | ORAL_TABLET | Freq: Four times a day (QID) | ORAL | 0 refills | Status: DC | PRN

## 2022-03-29 ENCOUNTER — Telehealth: Payer: Self-pay | Admitting: Oncology

## 2022-03-29 NOTE — Telephone Encounter (Signed)
03/29/22 Spoke with patient and scheduled  CT SCAN on 04/05/22 arrive at 830am

## 2022-03-30 NOTE — Progress Notes (Signed)
Pt has appt for scan on 04/05/22 & appt w/Dr Bobby Rumpf on 04/06/2022.   RE: CT abd done @ [facility] ER, unrelieved pain Received: 4 days ago Melodye Ped, NP  [place], Leeanne Butters W, RN Yes, he does not feel it is disease progression, but that is all he said. Does he have follow up soon? If not, maybe we can get him in with him.        Previous Messages    ----- Message -----  From: Dairl Ponder, RN  Sent: 03/26/2022   2:13 PM EDT  To: Melodye Ped, NP  Subject: FW: CT abd done @ [facility] ER, unrelieved pain   Have you gotten chance to talk w/Dr Bobby Rumpf?  ----- Message -----  From: Melodye Ped, NP  Sent: 03/24/2022  10:10 AM EDT  To: Dairl Ponder, RN  Subject: RE: CT abd done @ [facility] ER, unrelieved pain   I have CT report for Dr. Bobby Rumpf to review and advise.

## 2022-04-01 NOTE — Progress Notes (Signed)
[facility]  [address] [place],  [place]  [address] [phone]  Clinic Day:  04/02/2022   Referring physician: Cyndi Bender, PA-C  HISTORY OF PRESENT ILLNESS:  The patient is a 71 y.o. male with metastatic gastrointestinal stromal tumor, including multiple tumor deposits throughout his abdomen.  Since early August 2021, the patient has been taking imatinib, which has been effective with his disease management.  However, the patient comes in today as recent scans at [facility] showed continued progression of his metastatic abdominal nodules.  In particular, his largest nodule in his right lower quadrant had increased from 3.9 cm to 6.6 cm in less than 4 months.  The patient claims he went to the emergency room due to having worsening abdominal pain and increased nausea.  He claims he has been compliant with his imatinib. Understandably, he is concerned as his daily quality of life has worsened due to his symptoms likely related to his progressive GIST disease.  PHYSICAL EXAM:  Blood pressure 139/82, pulse (!) 107, temperature 97.9 F (36.6 C), resp. rate 16, height 6' (1.829 m), weight 201 lb 1.6 oz (91.2 kg), SpO2 96 %. Wt Readings from Last 3 Encounters:  04/02/22 201 lb 1.6 oz (91.2 kg)  12/04/21 207 lb 9.6 oz (94.2 kg)  06/05/21 177 lb 1.6 oz (80.3 kg)   Body mass index is 27.27 kg/m. Performance status (ECOG): 1 - Symptomatic but completely ambulatory Physical Exam Constitutional:      Appearance: Normal appearance. He is not ill-appearing.  HENT:     Mouth/Throat:     Mouth: Mucous membranes are moist.     Pharynx: Oropharynx is clear. No oropharyngeal exudate or posterior oropharyngeal erythema.  Cardiovascular:     Rate and Rhythm: Normal rate and regular rhythm.     Heart sounds: No murmur heard.    No friction rub. No gallop.  Pulmonary:     Effort: Pulmonary effort is normal. No respiratory distress.     Breath sounds:  Normal breath sounds. No wheezing, rhonchi or rales.  Abdominal:     General: Bowel sounds are normal. There is no distension.     Palpations: Abdomen is soft. There is no mass.     Tenderness: There is no abdominal tenderness.  Musculoskeletal:        General: No swelling.     Right lower leg: No edema.     Left lower leg: No edema.  Lymphadenopathy:     Cervical: No cervical adenopathy.     Upper Body:     Right upper body: No supraclavicular or axillary adenopathy.     Left upper body: No supraclavicular or axillary adenopathy.     Lower Body: No right inguinal adenopathy. No left inguinal adenopathy.  Skin:    General: Skin is warm.     Coloration: Skin is not jaundiced.     Findings: No lesion or rash.  Neurological:     General: No focal deficit present.     Mental Status: He is alert and oriented to person, place, and time. Mental status is at baseline.  Psychiatric:        Mood and Affect: Mood normal.        Behavior: Behavior normal.        Thought Content: Thought content normal.    SCANS: CT scans of his chest, abdomen and pelvis done on August 1st revealed the following: LOWER CHEST: 1.5 cm nodule within the periphery of the  left lower lobe (2:4), similar to prior cardiac CT 09/22/2018. Reticular opacities at the bilateral lung bases, right greater than left. No pleural effusion. No pericardial effusion.   LIVER: Normal liver contour.  No focal liver lesions.   BILIARY: The gallbladder is normal in appearance. No biliary ductal dilatation.     SPLEEN: Normal in size and contour.   PANCREAS: Normal pancreatic contour.  No focal lesions.  No ductal dilation.   ADRENAL GLANDS: Normal appearance of the adrenal glands.   KIDNEYS/URETERS: Symmetric renal enhancement. Bilateral subcentimeter hypoattenuating lesions within the kidneys, too small to characterize. No hydronephrosis.  No solid renal mass. No renal calculi.   BLADDER: Unremarkable.   REPRODUCTIVE ORGANS:  Unremarkable prostate and seminal vesicles.   GI TRACT: No findings of bowel obstruction or acute inflammation.  The appendix is unremarkable.   PERITONEUM, RETROPERITONEUM AND MESENTERY: A large heterogeneously enhancing mass is noted within the right posterior peritoneal cavity measuring 5.8 x 5.1 x 6.6 cm (2:81, 4:71). Multiple additional peritoneal implants and regions of nodular thickening are identified (representative sites 2:49, 58, 62). Additionally there is a mass within the right hemipelvis along the sigmoid mesocolon measuring 5.2 x 3.8 x 3.8 cm (2:110, 6:59). A soft tissue mass with linear bands of tissue extending to the adjacent small bowel is seen within the periumbilical hernia measuring up to 1.9 cm and possibly reflecting an additional peritoneal implant (2:81). Sequelae of ventral hernia repair. No free air.  No ascites.  No fluid collection.   LYMPH NODES: No adenopathy.   VESSELS: Hepatic and portal veins are patent.  Normal caliber aorta. Moderate calcified atherosclerotic disease of the abdominal aorta. Replaced common hepatic artery originating from the SMA.   BONES and SOFT TISSUES: Small bilateral fat-containing inguinal hernias. Fat and soft tissue containing periumbilical hernia. Soft tissues are otherwise unremarkable. No aggressive osseous lesions. Multilevel degenerative changes of the spine. Grade 1 retrolisthesis of L5-S1. Procedure Note  Barnabas Lister, MD - 03/16/2022  Formatting of this note might be different from the original.  EXAM: CT ABDOMEN PELVIS W CONTRAST  DATE: 03/16/2022 11:18 AM  ACCESSION: [id number]  DICTATED: 03/16/2022 11:24 AM  INTERPRETATION LOCATION: [place]   CLINICAL INDICATION: 71 years old with Abdominal mass ; LLQ abdominal pain   COMPARISON: Cardiac CT 09/22/2018   TECHNIQUE: A helical CT scan of the abdomen and pelvis was obtained following IV contrast from the lung bases through the pubic symphysis. Images were reconstructed  in the axial plane. Coronal and sagittal reformatted images were also provided for further evaluation.   FINDINGS:   LOWER CHEST: 1.5 cm nodule within the periphery of the left lower lobe (2:4), similar to prior cardiac CT 09/22/2018. Reticular opacities at the bilateral lung bases, right greater than left. No pleural effusion. No pericardial effusion.   LIVER: Normal liver contour.  No focal liver lesions.   BILIARY: The gallbladder is normal in appearance. No biliary ductal dilatation.     SPLEEN: Normal in size and contour.   PANCREAS: Normal pancreatic contour.  No focal lesions.  No ductal dilation.   ADRENAL GLANDS: Normal appearance of the adrenal glands.   KIDNEYS/URETERS: Symmetric renal enhancement. Bilateral subcentimeter hypoattenuating lesions within the kidneys, too small to characterize. No hydronephrosis.  No solid renal mass. No renal calculi.   BLADDER: Unremarkable.   REPRODUCTIVE ORGANS: Unremarkable prostate and seminal vesicles.   GI TRACT: No findings of bowel obstruction or acute inflammation.  The appendix is unremarkable.   PERITONEUM,  RETROPERITONEUM AND MESENTERY: A large heterogeneously enhancing mass is noted within the right posterior peritoneal cavity measuring 5.8 x 5.1 x 6.6 cm (2:81, 4:71). Multiple additional peritoneal implants and regions of nodular thickening are identified (representative sites 2:49, 58, 62). Additionally there is a mass within the right hemipelvis along the sigmoid mesocolon measuring 5.2 x 3.8 x 3.8 cm (2:110, 6:59). A soft tissue mass with linear bands of tissue extending to the adjacent small bowel is seen within the periumbilical hernia measuring up to 1.9 cm and possibly reflecting an additional peritoneal implant (2:81). Sequelae of ventral hernia repair. No free air.  No ascites.  No fluid collection.   LYMPH NODES: No adenopathy.   VESSELS: Hepatic and portal veins are patent.  Normal caliber aorta. Moderate calcified  atherosclerotic disease of the abdominal aorta. Replaced common hepatic artery originating from the SMA.   BONES and SOFT TISSUES: Small bilateral fat-containing inguinal hernias. Fat and soft tissue containing periumbilical hernia. Soft tissues are otherwise unremarkable. No aggressive osseous lesions. Multilevel degenerative changes of the spine. Grade 1 retrolisthesis of L5-S1.   IMPRESSION:  --Multiple heterogeneous soft tissue masses in the abdomen with additional smaller implants/areas of peritoneal nodular thickening as detailed within the body of report. Findings are consistent with metastatic GIST, as indicated from an outside reports in care everywhere. Recommend correlation with prior imaging to assess for changes in disease burden.   --1.5 cm nodule within the left lower lobe, stable since at least 09/22/2018, indeterminate and possibly representing additional site of metastasis. Recommend correlation with prior imaging.   --Reticular opacities at the bilateral lung bases, right greater than left, findings can be seen in the setting of pulmonary fibrosis.    LABS:      ASSESSMENT & PLAN:  Assessment/Plan:  A 71 y.o. male with metastatic gastrointestinal stromal tumor (GIST).  In clinic today, I went over his most recent CT scan images with him and compared them to those done in April 2023.  The patient could see how his disease has progressed and is likely the reason behind his GI symptoms.  Based upon this, I will switch this gentleman from [place] to sunitinib, which he will take at 50 mg daily for 4 weeks, followed by a 2-week break.  The patient was made aware of the side effects which can go along with this oral therapy, including fatigue, nausea, diarrhea or constipation, and thyroid changes.  This medicine will be mailed to him via the [facility].  He knows to contact our office when he has received this medicine.  With respect to his nausea, I did prescribe  this gentleman Zofran 8 mg, which he can take every 8 hours as needed.  With respect to his abdominal pain, he knows to alternate his oxycodone 10 mg prescription with ibuprofen every few hours.  Otherwise, I will see this patient back in 6 weeks to see how he is doing before he heads into his potential second cycle of oral sunitinib.  Although despondent, the patient understands all the plans discussed today and is in agreement with them.    Raiza Kiesel Macarthur Critchley, MD

## 2022-04-02 ENCOUNTER — Other Ambulatory Visit: Payer: Self-pay | Admitting: Oncology

## 2022-04-02 ENCOUNTER — Inpatient Hospital Stay: Payer: 59 | Attending: Oncology | Admitting: Oncology

## 2022-04-02 ENCOUNTER — Other Ambulatory Visit (HOSPITAL_COMMUNITY): Payer: Self-pay

## 2022-04-02 ENCOUNTER — Inpatient Hospital Stay: Payer: 59

## 2022-04-02 ENCOUNTER — Telehealth: Payer: Self-pay | Admitting: Pharmacy Technician

## 2022-04-02 ENCOUNTER — Telehealth: Payer: Self-pay

## 2022-04-02 VITALS — BP 139/82 | HR 107 | Temp 97.9°F | Resp 16 | Ht 72.0 in | Wt 201.1 lb

## 2022-04-02 DIAGNOSIS — C481 Malignant neoplasm of specified parts of peritoneum: Secondary | ICD-10-CM

## 2022-04-02 DIAGNOSIS — C49A9 Gastrointestinal stromal tumor of other sites: Secondary | ICD-10-CM

## 2022-04-02 LAB — CBC AND DIFFERENTIAL
HCT: 38 — AB (ref 41–53)
Hemoglobin: 12.9 — AB (ref 13.5–17.5)
Neutrophils Absolute: 5.35
Platelets: 270 10*3/uL (ref 150–400)
WBC: 9.9

## 2022-04-02 LAB — CBC: RBC: 3.74 — AB (ref 3.87–5.11)

## 2022-04-02 LAB — COMPREHENSIVE METABOLIC PANEL
Albumin: 4.3 (ref 3.5–5.0)
Calcium: 9.2 (ref 8.7–10.7)

## 2022-04-02 LAB — HEPATIC FUNCTION PANEL
ALT: 19 U/L (ref 10–40)
AST: 25 (ref 14–40)
Alkaline Phosphatase: 50 (ref 25–125)
Bilirubin, Total: 0.8

## 2022-04-02 LAB — BASIC METABOLIC PANEL
BUN: 15 (ref 4–21)
CO2: 27 — AB (ref 13–22)
Chloride: 108 (ref 99–108)
Creatinine: 1.4 — AB (ref 0.6–1.3)
Glucose: 132
Potassium: 4 mEq/L (ref 3.5–5.1)
Sodium: 141 (ref 137–147)

## 2022-04-02 MED ORDER — SUNITINIB MALATE 50 MG PO CAPS
50.0000 mg | ORAL_CAPSULE | Freq: Every day | ORAL | 5 refills | Status: DC
  Filled 2022-04-02: qty 28, 28d supply, fill #0

## 2022-04-02 MED ORDER — ONDANSETRON 8 MG PO TBDP: 8.0000 mg | ORAL_TABLET | Freq: Three times a day (TID) | ORAL | 0 refills | Status: DC | PRN

## 2022-04-02 NOTE — Telephone Encounter (Signed)
Received New start notification for  Sutent . Will update as we work through the benefits process.   Submitted a Prior Authorization request to [facility] for  SUNItinib Malate '50MG'$   via CoverMyMeds. Will update once we receive a response.   (KeyBo Merino) - [id number]

## 2022-04-02 NOTE — Telephone Encounter (Signed)
Oral Oncology Pharmacist Encounter  Received new prescription for sunitinib (Sutent) for the treatment of metastatic gastrointestinal stromal tumor (GIST), planned duration until disease progression or unacceptable toxicity.  Labs from 04/02/22 assessed, no interventions needed. Creatinine slightly increased although no dose recommendations for changes based on medication. Prescription dose and frequency assessed.  Current medication list in Epic reviewed, DDIs with Sutent identified: - flecainide, symbicort, and ondansetron:QT prolongation - will need to monitor patient; no recent EKGs. Last EKG in 2017.   Evaluated chart and no patient barriers to medication adherence noted.   Patient agreement for treatment documented in MD note on 04/02/2022.  Prescription has been e-scribed to the [facility] for benefits analysis and approval.  Oral Oncology Clinic will continue to follow for insurance authorization, copayment issues, initial counseling and start date.  Drema Halon, PharmD Hematology/Oncology Clinical Pharmacist [facility] [phone] 04/02/2022 11:42 AM

## 2022-04-02 NOTE — Telephone Encounter (Signed)
Oral Oncology Patient Advocate Encounter  Prior Authorization for Sutent '50mg'$ / SUNITINIB CAP '50MG'$  has been approved.    PA# (KeyBo Merino) - [id number] Effective dates: 04/02/22 through 04/03/23  Patient must fill through [facility] will continue to follow.

## 2022-04-05 ENCOUNTER — Other Ambulatory Visit (HOSPITAL_COMMUNITY): Payer: Self-pay

## 2022-04-05 ENCOUNTER — Other Ambulatory Visit: Payer: Self-pay

## 2022-04-05 DIAGNOSIS — C481 Malignant neoplasm of specified parts of peritoneum: Secondary | ICD-10-CM

## 2022-04-05 MED ORDER — SUNITINIB MALATE 50 MG PO CAPS: 50.0000 mg | ORAL_CAPSULE | Freq: Every day | ORAL | 5 refills | Status: DC

## 2022-04-06 ENCOUNTER — Ambulatory Visit: Payer: 59 | Admitting: Oncology

## 2022-04-07 ENCOUNTER — Inpatient Hospital Stay (INDEPENDENT_AMBULATORY_CARE_PROVIDER_SITE_OTHER): Payer: 59

## 2022-04-07 DIAGNOSIS — C49A0 Gastrointestinal stromal tumor, unspecified site: Secondary | ICD-10-CM

## 2022-04-07 NOTE — Progress Notes (Signed)
Oral Chemotherapy Pharmacist Encounter  I spoke with patient in person for overview of: Sutent (sunitinib) for the treatment of GIST, planned duration until disease progression or unacceptable toxicity.   Counseled patient on administration, dosing, side effects, monitoring, drug-food interactions, safe handling, storage, and disposal.  Patient will take Sutent '50mg'$  capsules, 1 capsule by mouth once daily with or without food.  Sutent will be administered 4 weeks on, 2 weeks off, repeat every 6 weeks.  Patient knows to aviod grapefruit and grapefruit juice while on Sutent.  Sutent start date: upon delivery from [facility]  Adverse effects include but are not limited to: fatigue, diarrhea, nausea, vomiting, mouth sores, rash, hand-foot syndrome, bleeding events, and altered cardiac function.  Patient has anti-emetic on hand and knows to take it if nausea develops.   Patient will obtain anti diarrheal and alert the office of 4 or more loose stools above baseline.  Patient informed Sutent should be temporarily interrupted for major surgical procedures and can be restarted upon recovery from surgery.     Reviewed with patient importance of keeping a medication schedule and plan for any missed doses. No barriers to medication adherence identified.  Medication reconciliation performed and medication/allergy list updated.  Insurance authorization for Sutent has been obtained. Patient must fill medication through [facility].  Patient brought in current medications which are listed below: Current Meds  Medication Sig   furosemide (LASIX) 40 MG tablet Take 40 mg by mouth 2 (two) times daily.   metoprolol succinate (TOPROL-XL) 25 MG 24 hr tablet Take 1 tablet by mouth daily.   ondansetron (ZOFRAN-ODT) 8 MG disintegrating tablet Take 1 tablet (8 mg total) by mouth every 8 (eight) hours as needed for nausea or vomiting.   SYMBICORT 160-4.5 MCG/ACT inhaler SMARTSIG:2  Puff(s) By Mouth Twice Daily   Additional medication is hydrocodone/ acetaminophen 10/'325mg'$    Drug interaction as noted in last note: sutent still interacts with symbicort (cat B) with intermediate risk and zofran (cat C)- patient currently taking zofran as needed roughly once daily. EKG may be warranted in the future to assess Qtc prolongation although risk is decreased as flecainide is not being taken.   Patient informed the pharmacy will reach out 5-7 days prior to needing next fill of Sutent to coordinate continued medication acquisition to prevent break in therapy.  All questions answered.  Mr. Elena voiced understanding and appreciation.   Medication education handout was given to patient in person Patient knows to call the office with questions or concerns. Oral Chemotherapy Clinic phone number provided to patient.   Drema Halon, PharmD Hematology/Oncology Clinical Pharmacist [facility] [phone] 04/07/2022   9:32 AM

## 2022-04-08 ENCOUNTER — Other Ambulatory Visit: Payer: Self-pay | Admitting: Hematology and Oncology

## 2022-04-08 ENCOUNTER — Telehealth: Payer: Self-pay

## 2022-04-08 DIAGNOSIS — C49A0 Gastrointestinal stromal tumor, unspecified site: Secondary | ICD-10-CM

## 2022-04-08 NOTE — Telephone Encounter (Signed)
Oral Oncology Pharmacist Encounter  Patient called to inform me that he received the medication, Sutent, from [place] today, 8/24. Informed patient to start medication tomorrow, 8/15. Patient agrees and physician notified.   Patient will see me in clinic on 9/6 with labs.   Drema Halon, PharmD Hematology/Oncology Clinical Pharmacist [facility] [phone]

## 2022-04-08 NOTE — Telephone Encounter (Signed)
04/08/22 lvm upcoming appt on 04/21/22

## 2022-04-14 NOTE — Telephone Encounter (Signed)
Spoke to [employer], Sutent delivered to patient on 04/08/22. Copay was $40.00.  Phone# [phone], option #1

## 2022-04-15 ENCOUNTER — Other Ambulatory Visit: Payer: Self-pay

## 2022-04-15 ENCOUNTER — Telehealth: Payer: Self-pay

## 2022-04-15 ENCOUNTER — Other Ambulatory Visit: Payer: Self-pay | Admitting: Oncology

## 2022-04-15 DIAGNOSIS — R630 Anorexia: Secondary | ICD-10-CM

## 2022-04-15 DIAGNOSIS — C49A0 Gastrointestinal stromal tumor, unspecified site: Secondary | ICD-10-CM

## 2022-04-15 MED ORDER — MEGESTROL ACETATE 40 MG/ML PO SUSP: ORAL | 1 refills | Status: DC

## 2022-04-15 NOTE — Telephone Encounter (Signed)
I spoke with pt to see how he is tolerating the Sutent. Pt states, "So far, so good". He takes the medication daily @ 630p without food. No missed doses. Denies N/V, diarrhea, SOB, skin rash, and fevers. He does admit to poor appetite. Dr Bobby Rumpf refilled his Megace today. I reminded pt of the importance in calling us if he were to develop temp of 100.4 or higher, day or night. He verbalized understanding. We also confirmed his next appt.

## 2022-04-21 ENCOUNTER — Inpatient Hospital Stay: Payer: 59 | Attending: Oncology

## 2022-04-21 ENCOUNTER — Inpatient Hospital Stay: Payer: 59

## 2022-04-21 ENCOUNTER — Ambulatory Visit: Payer: 59 | Admitting: Dietician

## 2022-04-21 VITALS — BP 140/86 | Wt 193.1 lb

## 2022-04-21 DIAGNOSIS — C481 Malignant neoplasm of specified parts of peritoneum: Secondary | ICD-10-CM

## 2022-04-21 DIAGNOSIS — C49A0 Gastrointestinal stromal tumor, unspecified site: Secondary | ICD-10-CM

## 2022-04-21 LAB — HEPATIC FUNCTION PANEL
ALT: 16 U/L (ref 10–40)
AST: 17 (ref 14–40)
Alkaline Phosphatase: 54 (ref 25–125)
Bilirubin, Total: 0.6

## 2022-04-21 LAB — BASIC METABOLIC PANEL
BUN: 12 (ref 4–21)
CO2: 25 — AB (ref 13–22)
Chloride: 104 (ref 99–108)
Creatinine: 1.1 (ref 0.6–1.3)
Glucose: 100
Potassium: 4.1 mEq/L (ref 3.5–5.1)
Sodium: 138 (ref 137–147)

## 2022-04-21 LAB — COMPREHENSIVE METABOLIC PANEL
Albumin: 4.1 (ref 3.5–5.0)
Calcium: 9 (ref 8.7–10.7)

## 2022-04-21 LAB — CBC AND DIFFERENTIAL
HCT: 38 — AB (ref 41–53)
Hemoglobin: 13.3 — AB (ref 13.5–17.5)
Neutrophils Absolute: 5.5
Platelets: 286 10*3/uL (ref 150–400)
WBC: 10

## 2022-04-21 LAB — CBC: RBC: 3.84 — AB (ref 3.87–5.11)

## 2022-04-21 NOTE — Progress Notes (Signed)
Nutrition Assessment: Reached out to patient at home/cell telephone number.    Reason for Assessment: Referral from [facility] for weight loss.   ASSESSMENT: Patient is a 71 y.o. male with metastatic gastrointestinal stromal tumor, including multiple tumor deposits throughout his abdomen.  His treatment is PO Sutent. He is follow by Dr. Bobby Rumpf.  He has PMHx that includes COPD and DDD. He reports Nutrition impact symptoms that include poor appetite, nausea, and taste changes.  Bowels are usual at this time every other day usually formed.      Nutrition Focused Physical Exam: unable to perform NFPE   Medications: just started Megace and his appetite has improved slightly, using Zofran but nausea is not controlled   Labs: 04/21/22 Hgb 13.3 improving   Anthropometrics:  Weight loss 8# (3.9%) past 2 weeks  Height: 72" Weight:  04/21/22  193# 04/02/22   201# 12/04/21  207#  BMI: 26.19   Estimated Energy Needs  Kcals: 2640-3000 Protein: 105-132 g Fluid: 3 L   NUTRITION DIAGNOSIS: Inadequate PO intake related cancer and  treatment  side effect AEB reported PO intake and weight loss past 2 week.   INTERVENTION:   Educated on importance of adequate calorie and protein energy intake  with nutrient dense foods when possible to maintain weight/strength Discussed ways to add calories/protein to foods  Encouraged bland food at room temperature with small frequent feeds.   Suggested use of oral nutrition supplements that are 350 calories and 30 grams of protein if being used as meal replacement. Recommend 1-5 per day as needed to maintain weight Discussed strategies for decreasing nausea   Patient report he doesn't use email on file. Mailed Nutrition Tip sheet  for  Nutrition During Cancer Treatment, nausea, and coupons for Ensure, with contact information  MONITORING, EVALUATION, GOAL: weight trends, nutrition impact symptoms, PO intake, labs   Next Visit: Next week remote  April Manson,  RDN, LDN Registered Dietitian, [place] (Usual office hours: Tuesday-Thursday) Mobile: [phone] Remote Office: [phone]

## 2022-04-29 ENCOUNTER — Telehealth: Payer: Self-pay

## 2022-04-29 ENCOUNTER — Inpatient Hospital Stay: Payer: 59 | Admitting: Dietician

## 2022-04-29 NOTE — Progress Notes (Signed)
Nutrition Follow Up:  Reached out to patient at home telephone number.  He reports he feels like he can only take a few bites then doesn't want food anymore.  He is off meats they taste too funky.  He has been using salt water rinses prior to eating to "get the yuck out of his mouth."  Tolerating cabbage and onions, liquids easier to get down than foods now.  He reports has scale at home but needs to get batteries.    Medications: still taking Megace   Labs: NNL  Anthropometrics:  Weight loss 8# (3.9%) past 2 weeks  Height: 72" Weight:  04/21/22  193# 04/02/22   201# 12/04/21  207#  DBW: 190-193#  BMI: 26.19   Estimated Energy Needs  Kcals: 2640-3000 Protein: 105-132 g Fluid: 3 L   NUTRITION DIAGNOSIS: Inadequate PO intake related cancer and  treatment  side effect AEB reported PO intake and weight loss past 2 week.   INTERVENTION:   Continued to encourage adequate calorie and protein energy intake  with nutrient dense foods when possible to maintain weight/strength. Encouraged peanut butter, beans, pea protein sources.  Encouraged him to prioritize protein first with meal attempts. Weigh himself every 2 weeks.  Will mail more coupons.  MONITORING, EVALUATION, GOAL: weight trends, nutrition impact symptoms, PO intake, labs   Next Visit: remote 2 weeks  April Manson, RDN, LDN Registered Dietitian, [place] (Usual office hours: Tuesday-Thursday) Mobile: [phone] Remote Office: [phone]

## 2022-04-29 NOTE — Telephone Encounter (Signed)
I spoke with pt to see how he is tolerating the Sutent. Pt states, "I'm not doing too bad. I just don't eat a lot. I wake up hungry. I get meal ready and then I can only take 2 bites and I'm done". He is taking megace to help his appetite.  In fact, our dietician called him today and has been following him closely.  Pt is taking the Sutent @ 630p, without food. He denies missed doses. He does admit to fatigue. He states that once he is finished with taking his bath, he is exhausted. He denies N/V, mouth sores, diarrhea, fevers, increased SOB (has some SOB baseline due to COPD), skin rash, & hand/foot syndrome. He is applying cream to his hands and feet daily. I reminded him to please call us if he develops temp of 100.4, day or night. He verbalized understanding. I made him a f/u appt (6 weeks from Dr Bobby Rumpf' last visit per his note) for 05/13/2022.

## 2022-05-12 NOTE — Progress Notes (Signed)
[place]  [address] [place],  [place]  [address] [phone]  Clinic Day:  05/13/2022   Referring physician: Cyndi Bender, PA-C  HISTORY OF PRESENT ILLNESS:  The patient is a 71 y.o. male with metastatic gastrointestinal stromal tumor, including multiple tumor deposits throughout his abdomen.  He comes in today to reassess how he is doing after his first 6-week cycle of sunitinib.  This agent was started after recent scans showed disease progression while on imatinib.  Overall, the patient is doing okay.  He still has a fairly significant amount of lower abdominal discomfort, which he attributes to his disease.  He is also concerned as his appetite has waned tremendously, despite being on Megace.  Nausea has also become an issue despite him having sublingual ondansetron at home.  When inquiring both him and his wife about the side effects of sunitinib, neither believes this agent has led to a downturn in his health.  His wife believes he had the same problems while he was taking imatinib.  With respect to this gentleman's metastatic GIST, he was placed on imatinib in August 2021.  His disease was kept under control with this agent until disease progression was seen on recent scans in August 2023.  This led to him being switched to sunitinib at that time.  PHYSICAL EXAM:  Blood pressure 128/82, pulse 97, temperature 98.4 F (36.9 C), resp. rate 16, height 6' (1.829 m), weight 185 lb 1.6 oz (84 kg), SpO2 98 %. Wt Readings from Last 3 Encounters:  05/13/22 185 lb 1.6 oz (84 kg)  04/21/22 193 lb 1.6 oz (87.6 kg)  04/02/22 201 lb 1.6 oz (91.2 kg)   Body mass index is 25.1 kg/m. Performance status (ECOG): 1 - Symptomatic but completely ambulatory Physical Exam Constitutional:      Appearance: Normal appearance. He is not ill-appearing.  HENT:     Mouth/Throat:     Mouth: Mucous membranes are moist.     Pharynx: Oropharynx is clear. No oropharyngeal  exudate or posterior oropharyngeal erythema.  Cardiovascular:     Rate and Rhythm: Normal rate and regular rhythm.     Heart sounds: No murmur heard.    No friction rub. No gallop.  Pulmonary:     Effort: Pulmonary effort is normal. No respiratory distress.     Breath sounds: Normal breath sounds. No wheezing, rhonchi or rales.  Abdominal:     General: Bowel sounds are normal. There is no distension.     Palpations: Abdomen is soft. There is no mass.     Tenderness: There is no abdominal tenderness.  Musculoskeletal:        General: No swelling.     Right lower leg: No edema.     Left lower leg: No edema.  Lymphadenopathy:     Cervical: No cervical adenopathy.     Upper Body:     Right upper body: No supraclavicular or axillary adenopathy.     Left upper body: No supraclavicular or axillary adenopathy.     Lower Body: No right inguinal adenopathy. No left inguinal adenopathy.  Skin:    General: Skin is warm.     Coloration: Skin is not jaundiced.     Findings: No lesion or rash.  Neurological:     General: No focal deficit present.     Mental Status: He is alert and oriented to person, place, and time. Mental status is at baseline.  Psychiatric:  Mood and Affect: Mood normal.        Behavior: Behavior normal.        Thought Content: Thought content normal.    LABS:    Latest Reference Range & Units 05/13/22 00:00  Sodium 137 - 147  139 (E)  Potassium 3.5 - 5.1 mEq/L 3.9 (E)  Chloride 99 - 108  109 ! (E)  CO2 13 - 22  22 (E)  Glucose  97 (E)  BUN 4 - 21  10 (E)  Creatinine 0.6 - 1.3  1.1 (E)  Calcium 8.7 - 10.7  9.2 (E)  Alkaline Phosphatase 25 - 125  61 (E)  Albumin 3.5 - 5.0  4.1 (E)  AST 14 - 40  24 (E)  ALT 10 - 40 U/L 15 (E)  Bilirubin, Total  1.0 (E)  WBC  6.5 (E)  RBC 3.87 - 5.11  3.82 ! (E)  Hemoglobin 13.5 - 17.5  13.2 ! (E)  HCT 41 - 53  38 ! (E)  Platelets 150 - 400 K/uL 181 (E)  NEUT#  2.86 (E)  !: Data is abnormal (E): External lab  result  ASSESSMENT & PLAN:  Assessment/Plan:  A 71 y.o. male with metastatic gastrointestinal stromal tumor (GIST).  He will proceed with his second cycle of sunitinib next week.  With respect to issues with involving his daily quality of life, I will now prescribe this gentleman Marinol 5 mg twice daily to help with appetite stimulation.  The patient will also be prescribed prednisone 20 mg daily for 3 weeks to see if that may also stimulate his appetite. As mentioned previously, he has also had issues with nausea.  I will prescribe him Compazine 10 mg, which he knows to alternate every 4 hours with his sublingual ondansetron to help with this symptom.  Otherwise, I will see this patient back in mid November 2023 for repeat clinical assessment.  Scans will be done a day before his next visit to ascertain his new disease baseline after 2 cycles of oral sunitinib therapy.  The patient understands all the plans discussed today and knows to contact our office before his next visit if he runs into additional problems that require immediate clinical attention.    Jacob Denz Macarthur Critchley, MD

## 2022-05-13 ENCOUNTER — Inpatient Hospital Stay (INDEPENDENT_AMBULATORY_CARE_PROVIDER_SITE_OTHER): Payer: 59 | Admitting: Oncology

## 2022-05-13 ENCOUNTER — Other Ambulatory Visit: Payer: Self-pay | Admitting: Oncology

## 2022-05-13 ENCOUNTER — Inpatient Hospital Stay: Payer: 59

## 2022-05-13 VITALS — BP 128/82 | HR 97 | Temp 98.4°F | Resp 16 | Ht 72.0 in | Wt 185.1 lb

## 2022-05-13 DIAGNOSIS — C49A0 Gastrointestinal stromal tumor, unspecified site: Secondary | ICD-10-CM | POA: Diagnosis not present

## 2022-05-13 LAB — HEPATIC FUNCTION PANEL
ALT: 15 U/L (ref 10–40)
AST: 24 (ref 14–40)
Alkaline Phosphatase: 61 (ref 25–125)
Bilirubin, Total: 1

## 2022-05-13 LAB — BASIC METABOLIC PANEL
BUN: 10 (ref 4–21)
CO2: 22 (ref 13–22)
Chloride: 109 — AB (ref 99–108)
Creatinine: 1.1 (ref 0.6–1.3)
Glucose: 97
Potassium: 3.9 mEq/L (ref 3.5–5.1)
Sodium: 139 (ref 137–147)

## 2022-05-13 LAB — CBC AND DIFFERENTIAL
HCT: 38 — AB (ref 41–53)
Hemoglobin: 13.2 — AB (ref 13.5–17.5)
Neutrophils Absolute: 2.86
Platelets: 181 10*3/uL (ref 150–400)
WBC: 6.5

## 2022-05-13 LAB — COMPREHENSIVE METABOLIC PANEL
Albumin: 4.1 (ref 3.5–5.0)
Calcium: 9.2 (ref 8.7–10.7)

## 2022-05-13 LAB — CBC: RBC: 3.82 — AB (ref 3.87–5.11)

## 2022-05-13 MED ORDER — DRONABINOL 5 MG PO CAPS: 5.0000 mg | ORAL_CAPSULE | Freq: Two times a day (BID) | ORAL | 3 refills | Status: DC

## 2022-05-13 MED ORDER — PREDNISONE 20 MG PO TABS: 20.0000 mg | ORAL_TABLET | Freq: Every day | ORAL | 0 refills | Status: DC

## 2022-05-13 MED ORDER — PREDNISONE 50 MG PO TABS: ORAL_TABLET | ORAL | 0 refills | Status: DC

## 2022-05-13 MED ORDER — PROCHLORPERAZINE MALEATE 10 MG PO TABS: 10.0000 mg | ORAL_TABLET | Freq: Four times a day (QID) | ORAL | 0 refills | Status: DC | PRN

## 2022-05-20 ENCOUNTER — Ambulatory Visit: Payer: 59 | Admitting: Dietician

## 2022-05-20 NOTE — Progress Notes (Signed)
Nutrition Follow Up:  Reached out to patient at home telephone number.  He reports he feels la little better.  Said MD told him her could eat anything, and allowed to drink milk if separated by 2 hours from PO Sutent.  He says foods that have tasted good included: cracker jacks, chips, [employer] hard taco with sour cream.  Can't tolerate the 350 Ensure (chemical taste) can get the 220 down.    Medications: Marinol and Prednisone Rxed, hasn't picked up Marinol yet.     Labs: 05/13/22  Hgb 13.2 (improved very slightly from last month)  Anthropometrics:  Weight loss continues 8# (4%) past 3 weeks  Height: 72" Weight:  05/13/22  185# 04/21/22  193# 04/02/22   201# 12/04/21  207#  DBW: 190-193#  BMI: 25.1   Estimated Energy Needs  Kcals: 2640-3000 Protein: 105-132 g Fluid: 3 L   NUTRITION DIAGNOSIS: Inadequate PO intake related cancer and  treatment  side effect AEB reported PO intake and weight loss. --Continues   INTERVENTION:   Encouraged trial of continued use of anti nausea meds preemptively for next 3 day and monitor effect.  Encouraged use of Marinol which also helps with nausea.  Continued to encourage trial of flavors and use of creative spicing to get foods down (salsa, sour cream, caramel syrup, peanut butter). Encouraged continued use of pinto beans, peanut butter, beans, pea protein sources.  Encouraged him to try to eat something every 2 hours.  Consider MVI daily if not able to take 4 ONS/day.  Offered coupons but he states he still has some.  Weigh himself every 2 weeks.     MONITORING, EVALUATION, GOAL: weight trends, nutrition impact symptoms, PO intake, labs   Next Visit: remote next week  April Manson, RDN, LDN Registered Dietitian, [place] (Usual office hours: Tuesday-Thursday) Mobile: [phone] Remote Office: [phone]

## 2022-05-27 ENCOUNTER — Telehealth: Payer: Self-pay

## 2022-05-27 ENCOUNTER — Ambulatory Visit: Payer: 59 | Admitting: Dietician

## 2022-05-27 NOTE — Telephone Encounter (Signed)
I spoke with Jacob Perez to see how he is doing. The first thing he said was, "I'm getting my energy back. I have been able to do a little yard work". Jacob Perez is taking the Sutent @ 630pm without food. No missed doses. He denies fevers, mouth sores, N/V, hand-foot syndrome, diarrhea and skin rashes. I reminded Jacob Perez to call us if he develops temp of 100.4 or higher, day or night. He verbalized understanding, and thanked me for the call.

## 2022-05-27 NOTE — Progress Notes (Signed)
Nutrition Follow Up:  Called patient at his home telephone to follow up on his intake and appetite. He is taking the Marinol now, getting his taste back. Now eating 2.5 meals day, with nibbling in-between.  Snacking on smoothie drinks (peanut butter, banana).  Last night his took his wife out for birthday and was able to eat steak, loaded baked potato, salad dinner.  He report weight at 189# now so it has increased.  DBW: 190-195#  BMI: 25.1   Estimated Energy Needs  Kcals: 5974-1638 Protein: 105-132 g Fluid: 3 L   NUTRITION DIAGNOSIS: Inadequate PO intake related cancer and  treatment  side effect AEB reported PO intake and weight loss. --Improved   INTERVENTION:   Encouraged continued use of pinto beans, peanut butter, beans, pea protein sources.   Encouraged him to continue to include beef 2-3 times be per week.   He feels he can manage to increase to goal weight (1-3#/month gain) and get his strength back    MONITORING, EVALUATION, GOAL: weight trends, nutrition impact symptoms, PO intake, labs   Next Visit: remote next month after MD follow up  April Manson, RDN, LDN Registered Dietitian, [place] (Usual office hours: Tuesday-Thursday) Mobile: [phone]

## 2022-05-31 ENCOUNTER — Other Ambulatory Visit: Payer: Self-pay | Admitting: Oncology

## 2022-06-12 ENCOUNTER — Other Ambulatory Visit: Payer: Self-pay

## 2022-06-12 ENCOUNTER — Emergency Department
Admission: EM | Admit: 2022-06-12 | Discharge: 2022-06-12 | Disposition: A | Discharge status: Home / Self Care | Payer: 59 | Attending: Emergency Medicine | Admitting: Emergency Medicine

## 2022-06-12 ENCOUNTER — Encounter: Payer: Self-pay | Admitting: Emergency Medicine

## 2022-06-12 ENCOUNTER — Emergency Department: Payer: 59

## 2022-06-12 DIAGNOSIS — R110 Nausea: Secondary | ICD-10-CM | POA: Insufficient documentation

## 2022-06-12 DIAGNOSIS — R0602 Shortness of breath: Secondary | ICD-10-CM | POA: Diagnosis not present

## 2022-06-12 DIAGNOSIS — R6884 Jaw pain: Secondary | ICD-10-CM | POA: Insufficient documentation

## 2022-06-12 DIAGNOSIS — R079 Chest pain, unspecified: Secondary | ICD-10-CM

## 2022-06-12 DIAGNOSIS — I10 Essential (primary) hypertension: Secondary | ICD-10-CM | POA: Diagnosis not present

## 2022-06-12 DIAGNOSIS — R0789 Other chest pain: Secondary | ICD-10-CM | POA: Insufficient documentation

## 2022-06-12 DIAGNOSIS — R519 Headache, unspecified: Secondary | ICD-10-CM | POA: Diagnosis not present

## 2022-06-12 LAB — CBC
HCT: 39.4 % (ref 39.0–52.0)
Hemoglobin: 13.5 g/dL (ref 13.0–17.0)
MCH: 34.4 pg — ABNORMAL HIGH (ref 26.0–34.0)
MCHC: 34.3 g/dL (ref 30.0–36.0)
MCV: 100.3 fL — ABNORMAL HIGH (ref 80.0–100.0)
Platelets: 166 10*3/uL (ref 150–400)
RBC: 3.93 MIL/uL — ABNORMAL LOW (ref 4.22–5.81)
RDW: 15.9 % — ABNORMAL HIGH (ref 11.5–15.5)
WBC: 5.6 10*3/uL (ref 4.0–10.5)
nRBC: 0 % (ref 0.0–0.2)

## 2022-06-12 LAB — BASIC METABOLIC PANEL
Anion gap: 8 (ref 5–15)
BUN: 12 mg/dL (ref 8–23)
CO2: 24 mmol/L (ref 22–32)
Calcium: 8.8 mg/dL — ABNORMAL LOW (ref 8.9–10.3)
Chloride: 108 mmol/L (ref 98–111)
Creatinine, Ser: 0.97 mg/dL (ref 0.61–1.24)
GFR, Estimated: >60 mL/min (ref >60)
Glucose, Bld: 99 mg/dL (ref 70–99)
Potassium: 3.8 mmol/L (ref 3.5–5.1)
Sodium: 140 mmol/L (ref 135–145)

## 2022-06-12 LAB — TROPONIN I (HIGH SENSITIVITY)
Troponin I (High Sensitivity): 14 ng/L (ref <18)
Troponin I (High Sensitivity): 14 ng/L (ref <18)

## 2022-06-12 MED ORDER — ACETAMINOPHEN 325 MG PO TABS
650.0000 mg | ORAL_TABLET | Freq: Once | ORAL | Status: AC
  Administered 2022-06-12: 650 mg via ORAL
  Filled 2022-06-12: qty 2

## 2022-06-12 MED ORDER — AMLODIPINE BESYLATE 5 MG PO TABS
10.0000 mg | ORAL_TABLET | Freq: Once | ORAL | Status: AC
  Administered 2022-06-12: 10 mg via ORAL
  Filled 2022-06-12: qty 2

## 2022-06-12 MED ORDER — RANOLAZINE ER 500 MG PO TB12: 500.0000 mg | ORAL_TABLET | Freq: Two times a day (BID) | ORAL | 1 refills | Status: DC

## 2022-06-12 MED ORDER — RANOLAZINE ER 500 MG PO TB12
500.0000 mg | ORAL_TABLET | Freq: Two times a day (BID) | ORAL | Status: DC
  Administered 2022-06-12: 500 mg via ORAL
  Filled 2022-06-12: qty 1

## 2022-06-12 MED ORDER — HYDROCHLOROTHIAZIDE 25 MG PO TABS
25.0000 mg | ORAL_TABLET | Freq: Every day | ORAL | Status: DC
  Administered 2022-06-12: 25 mg via ORAL
  Filled 2022-06-12: qty 1

## 2022-06-12 MED ORDER — AMLODIPINE BESYLATE 10 MG PO TABS: 10.0000 mg | ORAL_TABLET | Freq: Every day | ORAL | 11 refills | Status: DC

## 2022-06-12 MED ORDER — ASCRIPTIN 325 MG PO TABS: 1.0000 | ORAL_TABLET | Freq: Every day | ORAL | 2 refills | Status: AC

## 2022-06-12 MED ORDER — RANOLAZINE ER 500 MG PO TB12: 500.0000 mg | ORAL_TABLET | Freq: Once | ORAL | 0 refills | Status: DC

## 2022-06-12 NOTE — ED Triage Notes (Signed)
Pt presents to ER from home via EMS with CP that radiates to jaw since midnight +sob -n/v, + dizziness when standing up  324 mg ASA, When he took Nitro dropped BP 70 point his MD recommended not to take it  166/100 80 Afib with Metoprolol controlled  99% RA

## 2022-06-12 NOTE — ED Provider Notes (Signed)
[facility] Provider Note    Event Date/Time   First MD Initiated Contact with Patient 06/12/22 1657     (approximate)   History   Chest Pain   HPI  Jacob Perez is a 71 y.o. male who reports last night at about midnight he developed burning in his chest from his epigastrium up into the mid chest.  It went up into his neck and jaw.  He was slightly nauseated and sweaty and short of breath.  The pain was present for hours in fact he still having some jaw pain now.  He does not have any pain when he bites down on his teeth.  He reports his blood pressure has been up for some time about a week or more.  He has had several episodes in the last week or so when he had sudden or rapid onset of a bad headache.  He had another episode last night.  He still having a headache now.   Patient's past medical history significant for metastatic gastrointestinal stromal tumor and multiple deposits throughout his abdomen.  He is taking Sutent for his cancer   Physical Exam   Triage Vital Signs: ED Triage Vitals  Enc Vitals Group     BP 06/12/22 1522 (!) 164/100     Pulse Rate 06/12/22 1522 80     Resp 06/12/22 1522 16     Temp 06/12/22 1522 98.3 F (36.8 C)     Temp Source 06/12/22 1522 Oral     SpO2 06/12/22 1522 96 %     Weight 06/12/22 1523 185 lb (83.9 kg)     Height 06/12/22 1523 6' (1.829 m)     Head Circumference --      Peak Flow --      Pain Score 06/12/22 1522 8     Pain Loc --      Pain Edu? --      Excl. in [place]? --     Most recent vital signs: Vitals:   06/12/22 1930 06/12/22 1957  BP:  (!) 147/85  Pulse: 83 75  Resp: 18 16  Temp:    SpO2: 98% 96%     General: Awake, no distress.  Head normocephalic atraumatic CV:  Good peripheral perfusion.  Heart regular rate and rhythm no audible murmurs Resp:  Normal effort.  Lungs are clear Abd:  No distention.  Soft and nontender Extremities with no edema Patient with no obvious focal neurological  deficits. ED Results / Procedures / Treatments   Labs (all labs ordered are listed, but only abnormal results are displayed) Labs Reviewed  BASIC METABOLIC PANEL - Abnormal; Notable for the following components:      Result Value   Calcium 8.8 (*)    All other components within normal limits  CBC - Abnormal; Notable for the following components:   RBC 3.93 (*)    MCV 100.3 (*)    MCH 34.4 (*)    RDW 15.9 (*)    All other components within normal limits  TROPONIN I (HIGH SENSITIVITY)  TROPONIN I (HIGH SENSITIVITY)     EKG EKG read and interpreted by me shows normal sinus rhythm rate of 81 left axis he has flipped T's inferiorly and in V3 through 6.  This is new since February 2017.  I do not have any new or EKGs to compare to.    RADIOLOGY X-ray read by radiology reviewed and interpreted by me shows no acute disease.  There  is a left pulmonary nodule that radiology feels is stable. Head CT read by radiology reviewed by me shows no acute intracranial pathology  PROCEDURES:  Critical Care performed:   Procedures   MEDICATIONS ORDERED IN ED: Medications  hydrochlorothiazide (HYDRODIURIL) tablet 25 mg (25 mg Oral Given 06/12/22 1848)  ranolazine (RANEXA) 12 hr tablet 500 mg (500 mg Oral Given 06/12/22 2001)  acetaminophen (TYLENOL) tablet 650 mg (650 mg Oral Given 06/12/22 1808)  amLODipine (NORVASC) tablet 10 mg (10 mg Oral Given 06/12/22 2000)     IMPRESSION / MDM / ASSESSMENT AND PLAN / ED COURSE  I reviewed the triage vital signs and the nursing notes. Sutent side effects include high blood pressure heart attacks heart arrhythmias and headache. Discussed head CT and sudden onset of severe headache with patient including the risk for intracranial hemorrhage especially with the Sutent which can cause this.  Told the patient there was 1 and 100 or 1 and 200 risk of missing an intracranial hemorrhage with a CT and offered a spinal tap patient does not want this.  I agree.   Patient's blood pressure is still in the 1 73-2 65 range systolic.  He is asking about something for this.  This is another side effect of the Sutent.  I discussed the patient in detail with Dr.Shaukat Humphrey Rolls, cardiology.  He feels we can send the patient home since there is only some flipped T waves on the EKG and both of his troponins are negative in the face of the continuing chest pain.  Dr. Humphrey Rolls asked me to start him on amlodipine 10 qd for his blood pressure Ranexa 500 twice daily for his possible angina.  This should work without causing any side effects like nitro.  Dr. Humphrey Rolls will see him in the office on Monday at 1030. Differential diagnosis includes, but is not limited to, gastritis or stomach spasm or angina or MI or esophageal spasm or intracranial hemorrhage.  Hypertension is a side effect of this medication as it is headache.  Patient's presentation is most consistent with acute presentation with potential threat to life or bodily function.  The patient is on the cardiac monitor to evaluate for evidence of arrhythmia and/or significant heart rate changes.  None were seen   Patient asked for something else for his headache.  We will give him the medications and see how he does with that.  If that does not work I will try some Vicodin or Percocet 1 dose.  FINAL CLINICAL IMPRESSION(S) / ED DIAGNOSES   Final diagnoses:  Nonspecific chest pain  Nonintractable headache, unspecified chronicity pattern, unspecified headache type     Rx / DC Orders   ED Discharge Orders          Ordered    ranolazine (RANEXA) 500 MG 12 hr tablet   Once,   Status:  Discontinued        06/12/22 1917    Aspirin Buf,AlHyd-MgHyd-CaCar, (ASCRIPTIN) 325 MG TABS  Daily        06/12/22 1917    amLODipine (NORVASC) 10 MG tablet  Daily        06/12/22 1917    ranolazine (RANEXA) 500 MG 12 hr tablet  2 times daily        06/12/22 1944             Note:  This document was prepared using Dragon voice  recognition software and may include unintentional dictation errors.   Nena Polio, MD 06/13/22 [phone]

## 2022-06-12 NOTE — Discharge Instructions (Signed)
I discussed your case with Dr.Shaukat Humphrey Rolls, cardiology.  Because both of your troponins, the heart blood test, has been negative, he feels it safe to let you go home.  He wants you to take Ranexa 500 mg twice a day.  This is a substitute for nitro that should not have any of the side effects that might show had for you.  He wants you to take amlodipine 10 mg once a day.  This should control your blood pressure and hopefully help with the headache.  He also want you to take aspirin once a day.  I have given you a dose of each of these here for tonight.  You can start the Ranexa in the morning and take the other 2 in the evening.  Dr. Humphrey Rolls will see you on Monday at 1030 in his office.  I have given you his contact information.  Please return here immediately if you have worsening chest pain or headache or any other problems.  If you cannot get a ride in the middle of the night call the ambulance.

## 2022-06-23 ENCOUNTER — Other Ambulatory Visit: Payer: Self-pay | Admitting: Oncology

## 2022-06-23 MED ORDER — OXYCODONE HCL 10 MG PO TABS: 10.0000 mg | ORAL_TABLET | ORAL | 0 refills | Status: DC | PRN

## 2022-06-30 NOTE — Progress Notes (Signed)
[facility]  [address] [place],  [place]  [address] [phone]  Clinic Day:  05/13/2022   Referring physician: Cyndi Bender, PA-C  HISTORY OF PRESENT ILLNESS:  The patient is a 71 y.o. male with metastatic gastrointestinal stromal tumor, including multiple tumor deposits throughout his abdomen.  He comes in today to reassess how he is doing after his first 6-week cycle of sunitinib.  This agent was started after recent scans showed disease progression while on imatinib.  Overall, the patient is doing okay.  He still has a fairly significant amount of lower abdominal discomfort, which he attributes to his disease.  He is also concerned as his appetite has waned tremendously, despite being on Megace.  Nausea has also become an issue despite him having sublingual ondansetron at home.  When inquiring both him and his wife about the side effects of sunitinib, neither believes this agent has led to a downturn in his health.  His wife believes he had the same problems while he was taking imatinib.  With respect to this gentleman's metastatic GIST, he was placed on imatinib in August 2021.  His disease was kept under control with this agent until disease progression was seen on recent scans in August 2023.  This led to him being switched to sunitinib at that time.  PHYSICAL EXAM:  There were no vitals taken for this visit. Wt Readings from Last 3 Encounters:  06/12/22 185 lb (83.9 kg)  05/13/22 185 lb 1.6 oz (84 kg)  04/21/22 193 lb 1.6 oz (87.6 kg)   There is no height or weight on file to calculate BMI. Performance status (ECOG): 1 - Symptomatic but completely ambulatory Physical Exam Constitutional:      Appearance: Normal appearance. He is not ill-appearing.  HENT:     Mouth/Throat:     Mouth: Mucous membranes are moist.     Pharynx: Oropharynx is clear. No oropharyngeal exudate or posterior oropharyngeal erythema.  Cardiovascular:     Rate and  Rhythm: Normal rate and regular rhythm.     Heart sounds: No murmur heard.    No friction rub. No gallop.  Pulmonary:     Effort: Pulmonary effort is normal. No respiratory distress.     Breath sounds: Normal breath sounds. No wheezing, rhonchi or rales.  Abdominal:     General: Bowel sounds are normal. There is no distension.     Palpations: Abdomen is soft. There is no mass.     Tenderness: There is no abdominal tenderness.  Musculoskeletal:        General: No swelling.     Right lower leg: No edema.     Left lower leg: No edema.  Lymphadenopathy:     Cervical: No cervical adenopathy.     Upper Body:     Right upper body: No supraclavicular or axillary adenopathy.     Left upper body: No supraclavicular or axillary adenopathy.     Lower Body: No right inguinal adenopathy. No left inguinal adenopathy.  Skin:    General: Skin is warm.     Coloration: Skin is not jaundiced.     Findings: No lesion or rash.  Neurological:     General: No focal deficit present.     Mental Status: He is alert and oriented to person, place, and time. Mental status is at baseline.  Psychiatric:        Mood and Affect: Mood normal.        Behavior: Behavior  normal.        Thought Content: Thought content normal.   LABS:    Latest Reference Range & Units 05/13/22 00:00  Sodium 137 - 147  139 (E)  Potassium 3.5 - 5.1 mEq/L 3.9 (E)  Chloride 99 - 108  109 ! (E)  CO2 13 - 22  22 (E)  Glucose  97 (E)  BUN 4 - 21  10 (E)  Creatinine 0.6 - 1.3  1.1 (E)  Calcium 8.7 - 10.7  9.2 (E)  Alkaline Phosphatase 25 - 125  61 (E)  Albumin 3.5 - 5.0  4.1 (E)  AST 14 - 40  24 (E)  ALT 10 - 40 U/L 15 (E)  Bilirubin, Total  1.0 (E)  WBC  6.5 (E)  RBC 3.87 - 5.11  3.82 ! (E)  Hemoglobin 13.5 - 17.5  13.2 ! (E)  HCT 41 - 53  38 ! (E)  Platelets 150 - 400 K/uL 181 (E)  NEUT#  2.86 (E)  !: Data is abnormal (E): External lab result  ASSESSMENT & PLAN:  Assessment/Plan:  A 71 y.o. male with metastatic  gastrointestinal stromal tumor (GIST).  He will proceed with his second cycle of sunitinib next week.  With respect to issues with involving his daily quality of life, I will now prescribe this gentleman Marinol 5 mg twice daily to help with appetite stimulation.  The patient will also be prescribed prednisone 20 mg daily for 3 weeks to see if that may also stimulate his appetite. As mentioned previously, he has also had issues with nausea.  I will prescribe him Compazine 10 mg, which he knows to alternate every 4 hours with his sublingual ondansetron to help with this symptom.  Otherwise, I will see this patient back in mid November 2023 for repeat clinical assessment.  Scans will be done a day before his next visit to ascertain his new disease baseline after 2 cycles of oral sunitinib therapy.  The patient understands all the plans discussed today and knows to contact our office before his next visit if he runs into additional problems that require immediate clinical attention.    Shada Nienaber Macarthur Critchley, MD

## 2022-07-01 ENCOUNTER — Telehealth: Payer: Self-pay

## 2022-07-01 ENCOUNTER — Telehealth: Payer: Self-pay | Admitting: Oncology

## 2022-07-01 ENCOUNTER — Other Ambulatory Visit (HOSPITAL_COMMUNITY): Payer: Self-pay

## 2022-07-01 ENCOUNTER — Inpatient Hospital Stay: Payer: 59 | Attending: Oncology | Admitting: Oncology

## 2022-07-01 ENCOUNTER — Telehealth: Payer: Self-pay | Admitting: Pharmacy Technician

## 2022-07-01 ENCOUNTER — Encounter: Payer: Self-pay | Admitting: Oncology

## 2022-07-01 ENCOUNTER — Other Ambulatory Visit: Payer: Self-pay | Admitting: Oncology

## 2022-07-01 VITALS — BP 120/62 | HR 90 | Temp 97.8°F | Resp 16 | Ht 72.0 in | Wt 176.7 lb

## 2022-07-01 DIAGNOSIS — C481 Malignant neoplasm of specified parts of peritoneum: Secondary | ICD-10-CM | POA: Diagnosis not present

## 2022-07-01 DIAGNOSIS — C49A0 Gastrointestinal stromal tumor, unspecified site: Secondary | ICD-10-CM

## 2022-07-01 MED ORDER — REGORAFENIB 40 MG PO TABS
160.0000 mg | ORAL_TABLET | Freq: Every day | ORAL | 2 refills | Status: DC
  Filled 2022-07-01: qty 84, 21d supply, fill #0

## 2022-07-01 NOTE — Telephone Encounter (Signed)
Oral Oncology Patient Advocate Encounter  Prior Authorization for Stivarga '40MG'$  has been approved.    PA# (KeyAlmira Coaster) - [id number] Effective dates: 07/01/22 through 07/02/23  Patient must fill through [facility] will continue to follow.

## 2022-07-01 NOTE — Telephone Encounter (Signed)
Received New start notification for  [facility] . Will update as we work through the benefits process.

## 2022-07-01 NOTE — Telephone Encounter (Signed)
Submitted a Prior Authorization request to [facility] for  Stivarga '40MG'$   via CoverMyMeds. Will update once we receive a response.   (KeyAlmira Coaster) - [id number]

## 2022-07-01 NOTE — Telephone Encounter (Signed)
07/01/22 next appt  scheduled and confirmed with patient

## 2022-07-01 NOTE — Telephone Encounter (Signed)
Oral Oncology Pharmacist Encounter  Received new prescription for regorafenib (Stivarga) for the treatment of metastatic GIST, planned duration until disease progression or unacceptable toxicity.  Labs from 06/12/22 (CBC, BMP) and 05/13/22 (hepatic) assessed, no interventions needed. Will discuss with MD about new baseline hepatic labs when patient follows up with me in clinic.   Prescription dose and frequency assessed, asked MD if the take 21 days and hold for 7 days can be added to prescription.  Current medication list in Epic reviewed, no significant DDIs with Stivarga identified.  Evaluated chart and no patient barriers to medication adherence noted.   Patient agreement for treatment documented in MD note on 07/01/2022.  Prescription has been e-scribed to the [facility] for benefits analysis and approval.  Oral Oncology Clinic will continue to follow for insurance authorization, copayment issues, initial counseling and start date.  Drema Halon, PharmD Hematology/Oncology Clinical Pharmacist [facility] [phone] 07/01/2022 11:31 AM

## 2022-07-02 ENCOUNTER — Other Ambulatory Visit: Payer: Self-pay

## 2022-07-02 ENCOUNTER — Other Ambulatory Visit: Payer: Self-pay | Admitting: Oncology

## 2022-07-02 MED ORDER — REGORAFENIB 40 MG PO TABS: 160.0000 mg | ORAL_TABLET | Freq: Every day | ORAL | 2 refills | Status: DC

## 2022-07-02 MED ORDER — PREDNISONE 20 MG PO TABS: 20.0000 mg | ORAL_TABLET | Freq: Every day | ORAL | 1 refills | Status: DC

## 2022-07-05 ENCOUNTER — Other Ambulatory Visit (HOSPITAL_COMMUNITY): Payer: Self-pay

## 2022-07-07 ENCOUNTER — Inpatient Hospital Stay: Payer: 59 | Admitting: Dietician

## 2022-07-07 ENCOUNTER — Inpatient Hospital Stay (INDEPENDENT_AMBULATORY_CARE_PROVIDER_SITE_OTHER): Payer: 59

## 2022-07-07 DIAGNOSIS — C49A0 Gastrointestinal stromal tumor, unspecified site: Secondary | ICD-10-CM

## 2022-07-07 NOTE — Progress Notes (Addendum)
Oral Chemotherapy Pharmacist Encounter  I spoke with patient for overview of: Stivarga (regorafenib) for the treatment of metastatic GIST, planned duration until disease progression or unacceptable toxicity.  Counseled patient on administration, dosing, side effects, monitoring, drug-food interactions, safe handling, storage, and disposal.  Due to patients GIST, stivarga will be started at full dose. Patient will be closely followed for monitoring for any side effects.  Patient will take Stivarga '40mg'$  tablets, 4 tablets ('160mg'$ ) by mouth once daily with a glass of water and a low fat meal of <600 calories, and <30% fat content. Patient will take for 3 weeks on, 1 week off. Repeat every 28 days.  Patient meets with dietician. Dietician notified that patient needs to take with low fat meal and will call him today to discuss further.  Dosing for subsequent cycles (3 weeks on, 1 week off, repeated every 4 weeks) will be determined based on toleration of cycle 1.  Patient knows to avoid grapefruit and grapefruit juice while on therapy with Stivarga.  Stivarga start date: 07/09/2022  Adverse effects include but are not limited to: hand-foot syndrome, diarrhea, nausea, abdominal pain, musculoskeletal pain, fatigue, hypertension, delayed wound healing, decreased blood counts, and hepatotoxicity.    Patient has anti-emetic on hand and knows to take it if nausea develops.   Patient will obtain anti diarrheal and alert the office of 4 or more loose stools above baseline.  Reviewed with patient importance of keeping a medication schedule and plan for any missed doses. No barriers to medication adherence identified.  Stivarga should be discontinued 2 weeks prior to any planned surgery and resumed postsurgery based on clinical judgement of wound healing.  Medication reconciliation performed and medication/allergy list updated. Patient did have LFTs drawn which are reported in [facility] system. Labs are  within normal limits.  Insurance authorization for Marylyn Ishihara has been obtained. Test claim at the pharmacy revealed copayment $0 for 1st fill of 28 days from [facility].  Patient informed the pharmacy will reach out 5-7 days prior to needing next fill of Stivarga to coordinate continued medication acquisition to prevent break in therapy.  All questions answered.  Patient voiced understanding and appreciation.   Medication education handout placed in mail for patient. Patient knows to call the office with questions or concerns. Oral Chemotherapy Clinic phone number provided to patient.   Drema Halon, PharmD Hematology/Oncology Clinical Pharmacist [facility] [phone] 07/07/2022   10:47 AM

## 2022-07-07 NOTE — Progress Notes (Signed)
  Nutrition Follow Up:   Called patient at his home telephone to follow up on his intake and appetite.  He reports with Prednisone his appetite has improved. He plans to take Stivarga with breakfast meals. We reviewed his usual breakfast choices (Egg sandwich with mayo, Grits with butter, flavored oatmeal).    He states his taste seems to be coming back  trying to add more calories with beverages. Hot Gingerale, (3 bottles), juices (1 glass) grape,  Ensure regular 2 per day (doesn't tolerate Complete)     Labs: 05/13/22  Hgb 13.2 (improved very slightly from last month)   Anthropometrics:  Weight loss continues 9# (4.8%) past 3 weeks   Height: 72" Weight:  07/01/22  176# 06/12/22  185# 04/21/22  193# 04/02/22   201# 12/04/21  207#   DBW: 190-193#   BMI: 23.96     Estimated Energy Needs   Kcals: 2640-3000 Protein: 105-132 g Fluid: 3 L    NUTRITION DIAGNOSIS: Inadequate PO intake related cancer and treatment  side effects AEB reported PO intake and weight loss. -Weight loss continued     INTERVENTION:    Encouraged low fat meals at breakfast during 3 weeks on Stivarga treatment. Reviewed breakfast options (1.5-2 Ensure if no time to eat meal, Egg sandwich without mayo on regular bread, grits limit butter to 1 tbl, oatmeal)  Warned to avoid high fat breakfast pastries and meats or deep-fried hash browns As appetite improved encouraged continued use of nutrient dense foods and high protein options (pinto beans, peanut butter, beans, pea protein sources).       MONITORING, EVALUATION, GOAL: weight trends, nutrition impact symptoms, PO intake, labs     Next Visit: remote next month after MD follow up   April Manson, RDN, LDN Registered Dietitian, [place] (Usual office hours: Tuesday-Thursday) Mobile: [phone]

## 2022-07-21 ENCOUNTER — Inpatient Hospital Stay: Payer: 59 | Attending: Oncology

## 2022-07-21 ENCOUNTER — Telehealth: Payer: Self-pay

## 2022-07-21 ENCOUNTER — Inpatient Hospital Stay: Payer: 59

## 2022-07-21 DIAGNOSIS — Z79899 Other long term (current) drug therapy: Secondary | ICD-10-CM | POA: Insufficient documentation

## 2022-07-21 DIAGNOSIS — C49A0 Gastrointestinal stromal tumor, unspecified site: Secondary | ICD-10-CM | POA: Insufficient documentation

## 2022-07-21 LAB — CMP (CANCER CENTER ONLY)
ALT: 19 U/L (ref 0–44)
AST: 20 U/L (ref 15–41)
Albumin: 3.7 g/dL (ref 3.5–5.0)
Alkaline Phosphatase: 42 U/L (ref 38–126)
Anion gap: 9 (ref 5–15)
BUN: 21 mg/dL (ref 8–23)
CO2: 23 mmol/L (ref 22–32)
Calcium: 9.1 mg/dL (ref 8.9–10.3)
Chloride: 109 mmol/L (ref 98–111)
Creatinine: 0.96 mg/dL (ref 0.61–1.24)
GFR, Estimated: >60 mL/min (ref >60)
Glucose, Bld: 109 mg/dL — ABNORMAL HIGH (ref 70–99)
Potassium: 3.7 mmol/L (ref 3.5–5.1)
Sodium: 141 mmol/L (ref 135–145)
Total Bilirubin: 0.6 mg/dL (ref 0.3–1.2)
Total Protein: 7 g/dL (ref 6.5–8.1)

## 2022-07-21 LAB — CBC WITH DIFFERENTIAL (CANCER CENTER ONLY)
Abs Immature Granulocytes: 0.06 10*3/uL (ref 0.00–0.07)
Basophils Absolute: 0 10*3/uL (ref 0.0–0.1)
Basophils Relative: 0 %
Eosinophils Absolute: 0.1 10*3/uL (ref 0.0–0.5)
Eosinophils Relative: 1 %
HCT: 38.1 % — ABNORMAL LOW (ref 39.0–52.0)
Hemoglobin: 12.8 g/dL — ABNORMAL LOW (ref 13.0–17.0)
Immature Granulocytes: 1 %
Lymphocytes Relative: 30 %
Lymphs Abs: 3.4 10*3/uL (ref 0.7–4.0)
MCH: 34.1 pg — ABNORMAL HIGH (ref 26.0–34.0)
MCHC: 33.6 g/dL (ref 30.0–36.0)
MCV: 101.6 fL — ABNORMAL HIGH (ref 80.0–100.0)
Monocytes Absolute: 0.7 10*3/uL (ref 0.1–1.0)
Monocytes Relative: 6 %
Neutro Abs: 7.3 10*3/uL (ref 1.7–7.7)
Neutrophils Relative %: 62 %
Platelet Count: 330 10*3/uL (ref 150–400)
RBC: 3.75 MIL/uL — ABNORMAL LOW (ref 4.22–5.81)
RDW: 15.9 % — ABNORMAL HIGH (ref 11.5–15.5)
WBC Count: 11.6 10*3/uL — ABNORMAL HIGH (ref 4.0–10.5)
nRBC: 0 % (ref 0.0–0.2)

## 2022-07-21 LAB — TSH: TSH: 3.148 u[IU]/mL (ref 0.350–4.500)

## 2022-07-21 NOTE — Telephone Encounter (Signed)
Pt here in clinic to see Jacob Perez. Pt states his appetite is much better with this medication. He is taking the Stivarga @ 8am w/low fat meal. No missed doses. He denies N/V, diarrhea and fevers. He is easily fatigued. His voice quality is hoarse, this is new since starting the [place]. His fingertips "feel like they have been burned". Hands dry- he is using lotion twice daily. He reports that his feet feel like he has shoes on, but he doesn't. No swelling noted in his feet. He is applying Vaseline to his feet. Temp 98.3 pulse 89 resp 14 BP 161/83 O2 sat 98% wt 173.4 pounds.

## 2022-07-21 NOTE — Progress Notes (Signed)
[facility]ral [place]  Telephone: [phone]  Referring oncologist: Lavera Guise, MD   Current therapy: Stivarga 126m daily for 21 days on, 7 days off.   Cycle 1: 07/09/22  Cycle 2 (upcoming): 08/06/22  HISTORY OF PRESENT ILLNESS: Jacob Perez a 71year old male with metastatic gastrointestinal stromal tumor (GIST). Patient previously on imatinib in August 2021 and due to disease progression was switched to sunitinib in August 2023. His CT scans from 07/01/22 showed disease progression and patient was not tolerating the sunitinib well so medication was changed to [place]   Patient met with me in clinic for ~10 day follow up since starting the [place] labs. Over the past ten days he reports increased appetite, taste has returned and food now tastes good to eat, fatigue where he has to sit down sometimes, no nausea/ vomiting, no diarrhea/ constipation, voice is hoarse, his fingertips feel as if they have been slightly burned, and sometimes his feet feel as if he has tight shoes on where he has to sit down to raise his feet to relieve the pressure although there is no swelling seen and no shoes on.  LABS:  Latest Reference Range & Units 06/12/22 15:34 07/21/22 09:03  Sodium 135 - 145 mmol/L 140 141  Potassium 3.5 - 5.1 mmol/L 3.8 3.7  Chloride 98 - 111 mmol/L 108 109  CO2 22 - 32 mmol/L 24 23  Glucose 70 - 99 mg/dL 99 109 (H)  BUN 8 - 23 mg/dL 12 21  Creatinine 0.61 - 1.24 mg/dL 0.97 0.96  Calcium 8.9 - 10.3 mg/dL 8.8 (L) 9.1  Anion gap 5 - _0 Alkaline Phosphatase 38 - 126 U/L  42  Albumin 3.5 - 5.0 g/dL  3.7  AST 15 - 41 U/L  20  ALT 0 - 44 U/L  19  Total Protein 6.5 - 8.1 g/dL  7.0  Total Bilirubin 0.3 - 1.2 mg/dL  0.6    Latest Reference Range & Units 06/12/22 15:34 07/21/22 09:03  WBC 4.0 - 10.5 K/uL 5.6 11.6 (H)  RBC 4.22 - 5.81 MIL/uL 3.93 (L) 3.75 (L)  Hemoglobin 13.0 - 17.0 g/dL 13.5 12.8 (L)  HCT 39.0 - 52.0 %  39.4 38.1 (L)  MCV 80.0 - 100.0 fL 100.3 (H) 101.6 (H)  MCH 26.0 - 34.0 pg 34.4 (H) 34.1 (H)  MCHC 30.0 - 36.0 g/dL 34.3 33.6  RDW 11.5 - 15.5 % 15.9 (H) 15.9 (H)  Platelets 150 - 400 K/uL 166 330   Allergies as of 07/21/2022       Reactions   Ace Inhibitors    Cough   Contrast Media [iodinated Contrast Media] Itching, Nausea And Vomiting   Ioxaglate Itching, Nausea And Vomiting   Metrizamide Itching, Nausea And Vomiting        Medication List        Accurate as of July 21, 2022 11:59 PM. If you have any questions, ask your nurse or doctor.          amLODipine 10 MG tablet Commonly known as: NORVASC Take 1 tablet (10 mg total) by mouth daily.   Ascriptin 325 MG Tabs Take 1 tablet by mouth daily.   dronabinol 5 MG capsule Commonly known as: MARINOL Take 1 capsule (5 mg total) by mouth 2 (two) times daily before a meal.   furosemide 40 MG tablet Commonly known as: LASIX Take 40 mg by mouth 2 (two) times daily.  megestrol 40 MG/ML suspension Commonly known as: MEGACE TAKE 20 MLS (800 MG TOTAL) BY MOUTH DAILY.   metoprolol succinate 25 MG 24 hr tablet Commonly known as: TOPROL-XL Take 1 tablet by mouth daily.   metoprolol tartrate 25 MG tablet Commonly known as: LOPRESSOR Take by mouth.   ondansetron 8 MG disintegrating tablet Commonly known as: ZOFRAN-ODT Take 1 tablet (8 mg total) by mouth every 8 (eight) hours as needed for nausea or vomiting.   Oxycodone HCl 10 MG Tabs Take 1 tablet (10 mg total) by mouth every 4 (four) hours as needed. 1 tab po q6h prn   predniSONE 20 MG tablet Commonly known as: DELTASONE Take 1 tablet (20 mg total) by mouth daily with breakfast. TAKE FOR APPETITE STIMULATION   prochlorperazine 10 MG tablet Commonly known as: COMPAZINE Take 1 tablet (10 mg total) by mouth every 6 (six) hours as needed for nausea or vomiting.   ranolazine 500 MG 12 hr tablet Commonly known as: Ranexa Take 1 tablet (500 mg total) by mouth 2  (two) times daily.   regorafenib 40 MG tablet Commonly known as: STIVARGA Take 4 tablets (160 mg total) by mouth daily with breakfast. Take with low fat meal. Take for 21 days on, 7 days off. Repeat every 28 days.   Symbicort 160-4.5 MCG/ACT inhaler Generic drug: budesonide-formoterol SMARTSIG:2 Puff(s) By Mouth Twice Daily        ASSESSMENT & PLAN: Overall, patient states that he is doing so much better on the Stivarga than when he was on the sunitinib. Patient states he is taking medication with a low fat meal in the morning and has not missed any doses and has not had any fevers.   Patient instructed to increase the lotion to 3-4 times a day due to the fingertips feeling sore. No visible bruising/ blisters/ peeling of skin in hands. Instructed patient to bring up at next appointment with Dr. Bobby Rumpf on 12/14 if the fingertips are not feeling better.    Patients blood pressure is elevated to 161/83. Will have patient discuss with primary care physician about antihypertensives. Will also have patient monitor blood pressure at home.   Since there is no swelling on the feet, will have patient continue to elevate when the feet feel as though there is pressure. Patient knows to call us the second there is any swelling.  Patient agrees with plan above and knows about appointment with Dr. Bobby Rumpf on 07/29/22 with labs.   Drema Halon, PharmD Hematology/Oncology Clinical Pharmacist [facility] [phone]

## 2022-07-28 NOTE — Progress Notes (Signed)
[facility]  [address] [place],  [place]  [address] [phone]  Clinic Day:  07/29/2022   Referring physician: Cyndi Bender, PA-C  HISTORY OF PRESENT ILLNESS:  The patient is a 71 y.o. male with metastatic gastrointestinal stromal tumor, including multiple tumor deposits throughout his abdomen.  Due to recent disease progression on sunitinib, the patient was recently switched to regorafenib.  He comes in today to be evaluated before heading into his 2nd cycle of regorafenib.  The patient claims to have tolerated his 1st cycle fairly well.  However, at the end of his first cycle, he began developing blisters on the bottom of his feet, which have him in discomfort.  He denies having other significant side effects at this time.    With respect to this gentleman's metastatic GIST, he was placed on imatinib in August 2021.  His disease was kept under control with this agent until disease progression was seen on recent scans in August 2023.  This led to him being switched to sunitinib.  However, after just 2 cycles of sunitinib, scans showed disease progression to where he is now on regorafenib.    PHYSICAL EXAM:  Blood pressure (!) 142/77, pulse 95, temperature 98.3 F (36.8 C), resp. rate 16, height 6' (1.829 m), weight 175 lb 1.6 oz (79.4 kg), SpO2 98 %. Wt Readings from Last 3 Encounters:  07/29/22 175 lb 1.6 oz (79.4 kg)  07/21/22 173 lb 6.4 oz (78.7 kg)  07/01/22 176 lb 11.2 oz (80.2 kg)   Body mass index is 23.75 kg/m. Performance status (ECOG): 1 - Symptomatic but completely ambulatory Physical Exam Constitutional:      Appearance: Normal appearance. He is not ill-appearing.  HENT:     Mouth/Throat:     Mouth: Mucous membranes are moist.     Pharynx: Oropharynx is clear. No oropharyngeal exudate or posterior oropharyngeal erythema.  Cardiovascular:     Rate and Rhythm: Normal rate and regular rhythm.     Heart sounds: No murmur heard.     No friction rub. No gallop.  Pulmonary:     Effort: Pulmonary effort is normal. No respiratory distress.     Breath sounds: Normal breath sounds. No wheezing, rhonchi or rales.  Abdominal:     General: Bowel sounds are normal. There is no distension.     Palpations: Abdomen is soft. There is no mass.     Tenderness: There is no abdominal tenderness.  Musculoskeletal:        General: No swelling.     Right lower leg: No edema.     Left lower leg: No edema.  Lymphadenopathy:     Cervical: No cervical adenopathy.     Upper Body:     Right upper body: No supraclavicular or axillary adenopathy.     Left upper body: No supraclavicular or axillary adenopathy.     Lower Body: No right inguinal adenopathy. No left inguinal adenopathy.  Skin:    General: Skin is warm.     Coloration: Skin is not jaundiced.     Findings: Lesion present. No rash.     Comments: Blisters are present on his bilateral plantar feet  Neurological:     General: No focal deficit present.     Mental Status: He is alert and oriented to person, place, and time. Mental status is at baseline.  Psychiatric:        Mood and Affect: Mood normal.  Behavior: Behavior normal.        Thought Content: Thought content normal.     LABS:    Latest Reference Range & Units 07/29/22 09:03  Sodium 135 - 145 mmol/L 143  Potassium 3.5 - 5.1 mmol/L 4.3  Chloride 98 - 111 mmol/L 109  CO2 22 - 32 mmol/L 25  Glucose 70 - 99 mg/dL 81  BUN 8 - 23 mg/dL 20  Creatinine 0.61 - 1.24 mg/dL 0.86  Calcium 8.9 - 10.3 mg/dL 9.1  Anion gap 5 - 15  9  Alkaline Phosphatase 38 - 126 U/L 45  Albumin 3.5 - 5.0 g/dL 3.7  AST 15 - 41 U/L 21  ALT 0 - 44 U/L 22  Total Protein 6.5 - 8.1 g/dL 7.0  Total Bilirubin 0.3 - 1.2 mg/dL 0.5  GFR, Est Non African American >60 mL/min >60   ASSESSMENT & PLAN:  Assessment/Plan:  A 71 y.o. male with metastatic gastrointestinal stromal tumor (GISTT).   Due to the significant blisters on his feet, I will  reduce his regorafenib from 160 mg to 120 mg daily, every 3/4 weeks.  He is currently on his off-week of therapy.  I will give him an additional week off before he proceeds with his 2nd cycle of regorafenib, which will be on December 28th.  I will see him back 4 weeks later before he proceeds with his 3rd cycle of treatment. he patient understands all the plans discussed today and is in agreement with them.     Sherece Gambrill Macarthur Critchley, MD

## 2022-07-29 ENCOUNTER — Inpatient Hospital Stay: Payer: 59 | Admitting: Oncology

## 2022-07-29 ENCOUNTER — Telehealth: Payer: Self-pay

## 2022-07-29 ENCOUNTER — Inpatient Hospital Stay: Payer: 59

## 2022-07-29 ENCOUNTER — Other Ambulatory Visit: Payer: Self-pay | Admitting: Oncology

## 2022-07-29 ENCOUNTER — Telehealth: Payer: Self-pay | Admitting: Oncology

## 2022-07-29 VITALS — BP 142/77 | HR 95 | Temp 98.3°F | Resp 16 | Ht 72.0 in | Wt 175.1 lb

## 2022-07-29 DIAGNOSIS — C481 Malignant neoplasm of specified parts of peritoneum: Secondary | ICD-10-CM

## 2022-07-29 DIAGNOSIS — C49A0 Gastrointestinal stromal tumor, unspecified site: Secondary | ICD-10-CM

## 2022-07-29 LAB — CMP (CANCER CENTER ONLY)
ALT: 22 U/L (ref 0–44)
AST: 21 U/L (ref 15–41)
Albumin: 3.7 g/dL (ref 3.5–5.0)
Alkaline Phosphatase: 45 U/L (ref 38–126)
Anion gap: 9 (ref 5–15)
BUN: 20 mg/dL (ref 8–23)
CO2: 25 mmol/L (ref 22–32)
Calcium: 9.1 mg/dL (ref 8.9–10.3)
Chloride: 109 mmol/L (ref 98–111)
Creatinine: 0.86 mg/dL (ref 0.61–1.24)
GFR, Estimated: >60 mL/min (ref >60)
Glucose, Bld: 81 mg/dL (ref 70–99)
Potassium: 4.3 mmol/L (ref 3.5–5.1)
Sodium: 143 mmol/L (ref 135–145)
Total Bilirubin: 0.5 mg/dL (ref 0.3–1.2)
Total Protein: 7 g/dL (ref 6.5–8.1)

## 2022-07-29 LAB — CBC AND DIFFERENTIAL
HCT: 40 — AB (ref 41–53)
Hemoglobin: 13.6 (ref 13.5–17.5)
Neutrophils Absolute: 6.9
Platelets: 248 10*3/uL (ref 150–400)
WBC: 11.9

## 2022-07-29 LAB — TSH: TSH: 4.133 u[IU]/mL (ref 0.350–4.500)

## 2022-07-29 LAB — CBC: RBC: 3.97 (ref 3.87–5.11)

## 2022-07-29 NOTE — Telephone Encounter (Signed)
Pt here today to see Dr Bobby Rumpf in clinic. I spoke with pt. He states, "I have blisters all over my feet". Pt removed socks for assessment. Blisters noted on bilateral feet. Feet are red, and flaky as well. Pt left socks off for Dr Bobby Rumpf to observe. Pt states his appetite is better (he has had a 32.8# wt loss since April). He states this new medication hasn't affected his taste, like the last.  His voice sounds better to me today. Although, he states he has hoarseness intermittently. His finger tips remain tender and have "burn feeling". No blisters noted. Pt denies N/V, diarrhea, and fever. His BP is 158/82 today (better than last visit). He took his last dose for this cycle this morning with low fat breakfast. No missed doses.   Dr Bobby Rumpf in to see pt. After seeing pt's feet, he recommended reducing the Stivarga to 3 tabs (instead of 4 tabs) for next cycle. I notified Kaitlyn Schomburg,RPH.

## 2022-07-29 NOTE — Telephone Encounter (Signed)
Patient has been scheduled for follow-up visit per 07/29/22 los. Pt given an appt calendar with date and time.

## 2022-08-04 ENCOUNTER — Ambulatory Visit: Payer: 59 | Admitting: Dietician

## 2022-08-04 NOTE — Progress Notes (Signed)
Nutrition Follow Up:   Called patient at his home telephone to follow up on his intake and appetite.  He reports with appetite has improved and taste is coming back.  Denies NIS bowels regular. Back to eating close to his normal intake he continues to use Ensure regular 2 per day.  His only concern now are the blisters on feet which make movement painful.  He will have a 2 week break before resuming Stivarga and dose has been reduced.  Usual intake now includes extra snacking on chips and cookies and cheese.  Tried to encourage more nutrient dense choices.      Labs: reviewed 07/29/22 no nutritional concerns   Anthropometrics:  Weight fluctuating within 3#   Height: 72" Weight:  07/29/22  175# 07/21/22  173.4# 07/01/22  176# 06/12/22  185# 04/21/22  193# 04/02/22   201# 12/04/21  207#   DBW: 180#   BMI: 23.96     Estimated Energy Needs   Kcals: 2640-3000 Protein: 105-132 g Fluid: 3 L    NUTRITION DIAGNOSIS: Inadequate PO intake related cancer and treatment side effects AEB reported PO intake and weight loss. -resolving , weight stable within 3# fluctuation BMI healthy     INTERVENTION:    Encouraged 2 cups of eggnog (after lunch and dinner) to boost calories warned against using when taking Stivarga.  Encouraged high protein nutrient dense snacks to help heal feet.  Mailed Ensure coupons.   MONITORING, EVALUATION, GOAL: weight trends, nutrition impact symptoms, PO intake, labs   Goal is gradual gain to DBW 180#.   Next Visit: PRN at patient or provider request   April Manson, RDN, LDN Registered Dietitian, [place] [facility] Remote (Usual office hours: Tuesday-Thursday) Mobile: [phone]

## 2022-08-13 ENCOUNTER — Telehealth: Payer: Self-pay

## 2022-08-13 NOTE — Telephone Encounter (Signed)
I spoke with Jacob Perez. He states he started his 2nd cycle yesterday @ breakfast. He denies emesis, diarrhea, increased SOB (He has baseline SOB), chest pain, skin rash, itching, chills, and fever. He has occasional nausea, relieved w/antiemetic. Pt reminded to call us if he were to develop a temp of 100.4 or higher, day or night. He verbalized understanding.

## 2022-08-19 ENCOUNTER — Telehealth: Payer: Self-pay

## 2022-08-19 NOTE — Telephone Encounter (Signed)
I spoke with Jacob Perez. He states, "I'm doing ok since they backed off on the dosage. They went to 3 tabs". Pt is taking the Stivarga @ 8am with a low fat breakfast. No missed doses. He denies N/V, diarrhea, abd pain, cold s/s, and increased SOB (has SOB baseline). Pt mentioned, "I was telling my wife this morning that my breathing was better. I bet I walked a mile yesterday with my dogs and I didn't get winded at all". Pt still has sensations of his shoes feeling tight. The blisters that were on his feet are drying up. "I feel like I'm walking on sponges". His fingertips still feel like they are burned at times. The quality of his voice is stronger today from my perspective. He states his voice comes and goes. I reminded pt of his next appt and to please call us if he develops temp of 100.4, day or night. Pt verbalized understanding.

## 2022-08-26 ENCOUNTER — Other Ambulatory Visit: Payer: Self-pay | Admitting: Oncology

## 2022-08-27 ENCOUNTER — Telehealth: Payer: Self-pay

## 2022-08-27 NOTE — Telephone Encounter (Signed)
Informed Dr. Bobby Rumpf and received orders to.  Patient is to decrease Stivarga from '120mg'$  to '80mg'$  at breakfast.  His next appt with Dr. Bobby Rumpf is 010/25/2024.    I have given this instructions to the patient and reminded him of his appt on 09/09/2022.

## 2022-09-03 ENCOUNTER — Telehealth: Payer: Self-pay

## 2022-09-03 NOTE — Telephone Encounter (Signed)
I spoke with Mr Jacob Perez. He was currently in his PCP office, as recommended by Dr Bobby Rumpf. Pt mentioned that he may have shingles. I asked that he call me once visit is over. He agreed.

## 2022-09-08 NOTE — Progress Notes (Signed)
[facility]  [address] [place],  [place]  [address] [phone]  Clinic Day:  07/29/2022   Referring physician: Cyndi Bender, PA-C  HISTORY OF PRESENT ILLNESS:  The patient is a 72 y.o. male with metastatic gastrointestinal stromal tumor, including multiple tumor deposits throughout his abdomen.  Due to recent disease progression on sunitinib, the patient was recently switched to regorafenib.  He comes in today to be evaluated before heading into his 3rd cycle of regorafenib.  The patient claims to have tolerated his 1st cycle fairly well.  However, at the end of his first cycle, he began developing blisters on the bottom of his feet, which have him in discomfort.  He denies having other significant side effects at this time.    With respect to this gentleman's metastatic GIST, he was placed on imatinib in August 2021.  His disease was kept under control with this agent until disease progression was seen on recent scans in August 2023.  This led to him being switched to sunitinib.  However, after just 2 cycles of sunitinib, scans showed disease progression to where he is now on regorafenib.    PHYSICAL EXAM:  There were no vitals taken for this visit. Wt Readings from Last 3 Encounters:  07/29/22 175 lb 1.6 oz (79.4 kg)  07/21/22 173 lb 6.4 oz (78.7 kg)  07/01/22 176 lb 11.2 oz (80.2 kg)   There is no height or weight on file to calculate BMI. Performance status (ECOG): 1 - Symptomatic but completely ambulatory Physical Exam Constitutional:      Appearance: Normal appearance. He is not ill-appearing.  HENT:     Mouth/Throat:     Mouth: Mucous membranes are moist.     Pharynx: Oropharynx is clear. No oropharyngeal exudate or posterior oropharyngeal erythema.  Cardiovascular:     Rate and Rhythm: Normal rate and regular rhythm.     Heart sounds: No murmur heard.    No friction rub. No gallop.  Pulmonary:     Effort: Pulmonary effort is  normal. No respiratory distress.     Breath sounds: Normal breath sounds. No wheezing, rhonchi or rales.  Abdominal:     General: Bowel sounds are normal. There is no distension.     Palpations: Abdomen is soft. There is no mass.     Tenderness: There is no abdominal tenderness.  Musculoskeletal:        General: No swelling.     Right lower leg: No edema.     Left lower leg: No edema.  Lymphadenopathy:     Cervical: No cervical adenopathy.     Upper Body:     Right upper body: No supraclavicular or axillary adenopathy.     Left upper body: No supraclavicular or axillary adenopathy.     Lower Body: No right inguinal adenopathy. No left inguinal adenopathy.  Skin:    General: Skin is warm.     Coloration: Skin is not jaundiced.     Findings: Lesion present. No rash.     Comments: Blisters are present on his bilateral plantar feet  Neurological:     General: No focal deficit present.     Mental Status: He is alert and oriented to person, place, and time. Mental status is at baseline.  Psychiatric:        Mood and Affect: Mood normal.        Behavior: Behavior normal.        Thought Content: Thought content normal.  LABS:    Latest Reference Range & Units 07/29/22 09:03  Sodium 135 - 145 mmol/L 143  Potassium 3.5 - 5.1 mmol/L 4.3  Chloride 98 - 111 mmol/L 109  CO2 22 - 32 mmol/L 25  Glucose 70 - 99 mg/dL 81  BUN 8 - 23 mg/dL 20  Creatinine 0.61 - 1.24 mg/dL 0.86  Calcium 8.9 - 10.3 mg/dL 9.1  Anion gap 5 - 15  9  Alkaline Phosphatase 38 - 126 U/L 45  Albumin 3.5 - 5.0 g/dL 3.7  AST 15 - 41 U/L 21  ALT 0 - 44 U/L 22  Total Protein 6.5 - 8.1 g/dL 7.0  Total Bilirubin 0.3 - 1.2 mg/dL 0.5  GFR, Est Non African American >60 mL/min >60   ASSESSMENT & PLAN:  Assessment/Plan:  A 72 y.o. male with metastatic gastrointestinal stromal tumor (GISTT).   Due to the significant blisters on his feet, I will reduce his regorafenib from 160 mg to 120 mg daily, every 3/4 weeks.  He is  currently on his off-week of therapy.  I will give him an additional week off before he proceeds with his 2nd cycle of regorafenib, which will be on December 28th.  I will see him back 4 weeks later before he proceeds with his 3rd cycle of treatment. he patient understands all the plans discussed today and is in agreement with them.     Sharnette Kitamura Macarthur Critchley, MD

## 2022-09-09 ENCOUNTER — Other Ambulatory Visit: Payer: Self-pay | Admitting: Oncology

## 2022-09-09 ENCOUNTER — Inpatient Hospital Stay: Payer: Medicare HMO | Attending: Oncology | Admitting: Oncology

## 2022-09-09 ENCOUNTER — Inpatient Hospital Stay: Payer: Medicare HMO

## 2022-09-09 VITALS — BP 136/82 | HR 96 | Temp 97.8°F | Resp 18 | Ht 72.0 in | Wt 179.0 lb

## 2022-09-09 DIAGNOSIS — C49A0 Gastrointestinal stromal tumor, unspecified site: Secondary | ICD-10-CM

## 2022-09-09 DIAGNOSIS — C481 Malignant neoplasm of specified parts of peritoneum: Secondary | ICD-10-CM

## 2022-09-09 DIAGNOSIS — Z79899 Other long term (current) drug therapy: Secondary | ICD-10-CM | POA: Diagnosis not present

## 2022-09-09 LAB — TSH: TSH: 2.719 u[IU]/mL (ref 0.350–4.500)

## 2022-09-09 LAB — CMP (CANCER CENTER ONLY)
ALT: 20 U/L (ref 0–44)
AST: 20 U/L (ref 15–41)
Albumin: 3.7 g/dL (ref 3.5–5.0)
Alkaline Phosphatase: 52 U/L (ref 38–126)
Anion gap: 8 (ref 5–15)
BUN: 12 mg/dL (ref 8–23)
CO2: 24 mmol/L (ref 22–32)
Calcium: 8.8 mg/dL — ABNORMAL LOW (ref 8.9–10.3)
Chloride: 107 mmol/L (ref 98–111)
Creatinine: 0.95 mg/dL (ref 0.61–1.24)
GFR, Estimated: >60 mL/min (ref >60)
Glucose, Bld: 102 mg/dL — ABNORMAL HIGH (ref 70–99)
Potassium: 4.4 mmol/L (ref 3.5–5.1)
Sodium: 139 mmol/L (ref 135–145)
Total Bilirubin: 0.8 mg/dL (ref 0.3–1.2)
Total Protein: 7.1 g/dL (ref 6.5–8.1)

## 2022-09-09 LAB — CBC: RBC: 4.21 (ref 3.87–5.11)

## 2022-09-09 LAB — CBC AND DIFFERENTIAL
HCT: 40 — AB (ref 41–53)
Hemoglobin: 14 (ref 13.5–17.5)
Neutrophils Absolute: 5.29
Platelets: 183 10*3/uL (ref 150–400)
WBC: 7.9

## 2022-09-09 MED ORDER — OXYCODONE HCL 10 MG PO TABS: 10.0000 mg | ORAL_TABLET | ORAL | 0 refills | Status: DC | PRN

## 2022-09-09 MED ORDER — PREDNISONE 20 MG PO TABS: 20.0000 mg | ORAL_TABLET | Freq: Every day | ORAL | 1 refills | Status: DC

## 2022-09-13 ENCOUNTER — Other Ambulatory Visit (HOSPITAL_COMMUNITY): Payer: Self-pay

## 2022-09-13 ENCOUNTER — Telehealth: Payer: Self-pay | Admitting: Pharmacy Technician

## 2022-09-13 NOTE — Telephone Encounter (Signed)
Oral Oncology Patient Advocate Encounter   Received notification that prior authorization for Jacob Perez is required though [facility]   PA submitted on 09/13/22 Key [id number] Status is pending     Lady Deutscher, CPhT-Adv Oncology Pharmacy Patient [place] Direct Number: [phone]  Fax: [phone]

## 2022-09-14 ENCOUNTER — Other Ambulatory Visit (HOSPITAL_COMMUNITY): Payer: Self-pay

## 2022-09-14 NOTE — Telephone Encounter (Signed)
Oral Oncology Patient Advocate Encounter  Called to check status of prior authorization for Stivarga through [facility] after receiving notice in [facility] that PA had already been denied on this medication.  I spoke with representative [place] with the prior authorization department at [facility]. Myriam Jacobson is faxing a copy of the denial letter to my onbase fax so that we can proceed with an appeal process.  Lady Deutscher, CPhT-Adv Oncology Pharmacy Patient [place] Direct Number: [phone]  Fax: [phone]

## 2022-09-15 ENCOUNTER — Other Ambulatory Visit (HOSPITAL_COMMUNITY): Payer: Self-pay

## 2022-09-15 NOTE — Telephone Encounter (Signed)
Oral Oncology Patient Advocate Encounter   An urgent appeal for the prior authorization denial of Stivarga has been startedon 09/15/22.  Criteria questions answered and case started via phone.  Case ID: [id number]  Chart notes have been faxed to the plan via e-fax [phone]  Estimated turnaround time is 7 days.   This encounter will continue to be updated until final appeal determination.      Lady Deutscher, CPhT-Adv Oncology Pharmacy Patient [place] Direct Number: [phone]  Fax: [phone]

## 2022-09-16 ENCOUNTER — Other Ambulatory Visit (HOSPITAL_COMMUNITY): Payer: Self-pay

## 2022-09-16 ENCOUNTER — Telehealth: Payer: Self-pay | Admitting: Pharmacy Technician

## 2022-09-16 NOTE — Telephone Encounter (Signed)
Oral Oncology Patient Advocate Encounter   Began application for assistance for Stivarga through [employer].   Application will be submitted upon completion of necessary supporting documentation.   [employer] [facility] phone number [phone].   I will continue to check the status until final determination.   Lady Deutscher, CPhT-Adv Oncology Pharmacy Patient [place] Direct Number: [phone]  Fax: [phone]

## 2022-09-16 NOTE — Telephone Encounter (Signed)
Oral Oncology Patient Advocate Encounter  Prior Authorization for Jacob Perez has been approved.    PA# [id number]  Effective dates: 08/16/22 through 08/16/23  Patients co-pay is $3,254.21.    Lady Deutscher, CPhT-Adv Oncology Pharmacy Patient [place] Direct Number: [phone]  Fax: [phone]

## 2022-09-17 NOTE — Telephone Encounter (Signed)
Oral Oncology Patient Advocate Encounter   Submitted application for assistance for Stivarga to [employer].   Application submitted via e-fax to [phone]   [employer] phone number [phone].   I will continue to check the status until final determination.   Jacob Perez, CPhT-Adv Oncology Pharmacy Patient [place] Direct Number: [phone]  Fax: [phone]

## 2022-09-20 NOTE — Telephone Encounter (Signed)
Oral Oncology Patient Advocate Encounter   Submitted Medicare A/B ID to [place] [employer] via efax [phone] as requested by the program.  Lady Deutscher, CPhT-Adv Oncology Pharmacy Patient [place] Direct Number: [phone]  Fax: [phone]

## 2022-09-24 NOTE — Telephone Encounter (Signed)
Oral Oncology Patient Advocate Encounter   Received notification that the application for assistance for Stivarga through [place] [employer] has been approved.   [place] phone number [phone].   Effective dates: 09/24/22 through 08/16/23  The patient has been notified.  Lady Deutscher, CPhT-Adv Oncology Pharmacy Patient [place] Direct Number: [phone]  Fax: [phone]

## 2022-09-27 ENCOUNTER — Telehealth: Payer: Self-pay | Admitting: Oncology

## 2022-09-27 NOTE — Telephone Encounter (Signed)
Patient has been scheduled. Aware of appt dates and times.   SCANS Received: Today Velora Heckler, CMA  Dairl Ponder, RN; P [facility] Scheduling Teric Bogda called late Friday afternoon to advise that Mr. Landaverde will be getting his medication Today (09-27-22) and will start tomorrow.  We need to move his scans out one week.  thanks

## 2022-10-05 ENCOUNTER — Telehealth: Payer: Self-pay

## 2022-10-05 NOTE — Telephone Encounter (Signed)
I talked to Mr. Schomberg. He states, "I'm feel pretty good" He started the 3rd cycle of Stivarga on 09/28/2022. He continues to take the medication @ 8am w/breakfast. No missed doses. He denies N/V, s/s URI, no increased SOB (as baseline SOB on exertion), diarrhea, abd pain, chills and fever. When I asked about his finger tips and toes. He replied, "they are a little numb, but no blisters. I had numbness in my feet before I started this treatment". Pt still battling "shingles on my left arm, for almost a month" per pt. He is taking oxycodone for pain. He has completed the Valtrex. Pt reminded to call us if he develops temp of 100.4 or higher, day or night. He verbalized understanding. We confirmed next appt as well.

## 2022-10-14 ENCOUNTER — Ambulatory Visit: Payer: Medicare HMO | Admitting: Oncology

## 2022-10-21 ENCOUNTER — Ambulatory Visit: Payer: Medicare HMO | Admitting: Oncology

## 2022-10-27 LAB — BASIC METABOLIC PANEL
BUN: 22 — AB (ref 4–21)
CO2: 26 — AB (ref 13–22)
Chloride: 107 (ref 99–108)
Creatinine: 0.7 (ref 0.6–1.3)
Glucose: 98
Potassium: 3.9 mEq/L (ref 3.5–5.1)
Sodium: 139 (ref 137–147)

## 2022-10-27 LAB — COMPREHENSIVE METABOLIC PANEL
Albumin: 3.9 (ref 3.5–5.0)
Calcium: 9 (ref 8.7–10.7)

## 2022-10-27 LAB — CBC AND DIFFERENTIAL
HCT: 42 (ref 41–53)
Hemoglobin: 14.1 (ref 13.5–17.5)
Neutrophils Absolute: 11.29
Platelets: 305 10*3/uL (ref 150–400)
WBC: 15.9

## 2022-10-27 LAB — CBC: RBC: 4.48 (ref 3.87–5.11)

## 2022-10-27 LAB — HEPATIC FUNCTION PANEL
ALT: 18 U/L (ref 10–40)
AST: 27 (ref 14–40)
Alkaline Phosphatase: 39 (ref 25–125)
Bilirubin, Total: 0.4

## 2022-10-27 NOTE — Progress Notes (Signed)
[facility]  [address] [place],  [place]  [address] [phone]  Clinic Day:  10/28/2022   Referring physician: Lonie Peak, PA-C  HISTORY OF PRESENT ILLNESS:  The patient is a 72 y.o. male with metastatic gastrointestinal stromal tumor, including multiple tumor deposits throughout his abdomen.  He comes in today to go over his CT scans to ascertain his new disease baseline after 3 cycles of regorafenib.  Due to increased blisters and discomfort involving his hands/feet, the patient is now taking regorafenib at 80 mg daily, every 3 out of 4 weeks.  Overall, he has done much better with the dose reduction.  His daily quality of life is much better.  He denies having any new symptoms or findings which concern him for overt signs of disease progression.  With respect to this gentleman's metastatic GIST, he was placed on imatinib in August 2021.  His disease was kept under control with this agent until disease progression was seen on scans in August 2023.  This led to him being switched to sunitinib.  However, after just 2 cycles of sunitinib, scans showed disease progression to where he is now on regorafenib.    PHYSICAL EXAM:  Blood pressure (!) 154/91, pulse 79, temperature 97.7 F (36.5 C), resp. rate 18, height 6' (1.829 m), weight 190 lb 12.8 oz (86.5 kg), SpO2 96 %. Wt Readings from Last 3 Encounters:  10/28/22 190 lb 12.8 oz (86.5 kg)  09/09/22 179 lb (81.2 kg)  07/29/22 175 lb 1.6 oz (79.4 kg)   Body mass index is 25.88 kg/m. Performance status (ECOG): 1 - Symptomatic but completely ambulatory Physical Exam Constitutional:      Appearance: Normal appearance. He is not ill-appearing.  HENT:     Mouth/Throat:     Mouth: Mucous membranes are moist.     Pharynx: Oropharynx is clear. No oropharyngeal exudate or posterior oropharyngeal erythema.  Cardiovascular:     Rate and Rhythm: Normal rate and regular rhythm.     Heart sounds: No murmur  heard.    No friction rub. No gallop.  Pulmonary:     Effort: Pulmonary effort is normal. No respiratory distress.     Breath sounds: Normal breath sounds. No wheezing, rhonchi or rales.  Abdominal:     General: Bowel sounds are normal. There is no distension.     Palpations: Abdomen is soft. There is no mass.     Tenderness: There is no abdominal tenderness.  Musculoskeletal:        General: No swelling.     Right lower leg: No edema.     Left lower leg: No edema.  Lymphadenopathy:     Cervical: No cervical adenopathy.     Upper Body:     Right upper body: No supraclavicular or axillary adenopathy.     Left upper body: No supraclavicular or axillary adenopathy.     Lower Body: No right inguinal adenopathy. No left inguinal adenopathy.  Skin:    General: Skin is warm.     Coloration: Skin is not jaundiced.     Findings: Lesion present. No rash.     Comments: Blisters are present on his bilateral plantar feet  Neurological:     General: No focal deficit present.     Mental Status: He is alert and oriented to person, place, and time. Mental status is at baseline.  Psychiatric:        Mood and Affect: Mood normal.  Behavior: Behavior normal.        Thought Content: Thought content normal.   SCANS:  CT scans of her chest/abdomen/pelvis revealed the following: FINDINGS: CT CHEST FINDINGS  Cardiovascular: Aortic atherosclerosis. Tortuous thoracic aorta. Normal heart size, without pericardial effusion. Median sternotomy. Multivessel coronary artery atherosclerosis.  No central pulmonary embolism, on this non-dedicated study.  Mediastinum/Nodes: No supraclavicular adenopathy. No mediastinal or hilar adenopathy.  Lungs/Pleura: No pleural fluid. Moderate centrilobular and paraseptal emphysema.  3-4 mm right lower lobe pulmonary nodule on 59/301, similar.  Mildly irregular left lower lobe pulmonary nodule measures 1.4 x 1.3 cm on 101/301. Similar on  06/30/2022.  Musculoskeletal: No acute osseous abnormality. Hemangioma within the T5 vertebral body.  CT ABDOMEN PELVIS FINDINGS  Hepatobiliary: Nonspecific caudate lobe enlargement. No focal liver lesion. Normal gallbladder, without biliary ductal dilatation.  Pancreas: Normal, without mass or ductal dilatation.  Spleen: Normal in size, without focal abnormality.  Adrenals/Urinary Tract: Normal adrenal glands. No suspicious renal mass or hydronephrosis. Normal urinary bladder.  Stomach/Bowel: Normal stomach, without wall thickening. Normal colon and terminal ileum. Normal small bowel.  Vascular/Lymphatic: Aortic atherosclerosis. No abdominopelvic adenopathy.  Reproductive: Normal prostate.  Other: Anterior pelvic surgical clips are likely related to hernia repair. No significant free fluid. Multifocal peritoneal implants are again identified. Index right pericolic gutter mass measures 7.5 x 6.5 cm on 94/2 versus 6.7 x 6.5 cm when remeasured in a similar fashion on the prior.  Right pelvic cul-de-sac mass measures 6.9 x 6.8 cm on 116/2 versus 6.6 x 6.1 cm on the prior.  More cephalad right paracolic gutter implant measures maximally 3.8 cm on 88/2 versus 2.8 cm when remeasured in a similar fashion on the prior.  Enlarging implant extending into the umbilicus including at 2.2 x 2.7 cm on 97/2 versus 1.1 cm on the prior exam (when remeasured).  No bowel obstruction or other acute complication.  Musculoskeletal: Degenerative disc disease at the lumbosacral junction.  IMPRESSION: 1. Mild progression of abdominopelvic peritoneal metastasis. 2. No change in size of dominant left lower lobe pulmonary nodule which could represent a metastasis or synchronous primary. 3. Aortic atherosclerosis (ICD10-I70.0), coronary artery atherosclerosis and emphysema (ICD10-J43.9).  LABS:     ASSESSMENT & PLAN:  Assessment/Plan:  A 72 y.o. male with metastatic gastrointestinal  stromal tumor (GIST).  In clinic today, I went over her CT scan images with him, for which he could see mild disease progression.  This gentleman is now on 3rd-line therapy with regorafenib at 80 mg daily, every 3/4 weeks.  In totality, his disease has not progressed enough to the point where I think a change in therapy is necessary.  With there being a limited amount of treatment options left, I want to get the maximum palliative benefits with every line of therapy before switching to a different agent.  Fortunately, it appears none of his metastatic deposits is an an area or organ yet where clinical decline could occur.  Of note, I did talk to him about potentially seeking a second opinion regarding his disease process; he and his family with think about this.  Otherwise, he will proceed with his 4th cycle of treatment.  I will see him back in 4 weeks before he proceeds with his 5th cycle of treatment.  The patient understands all the plans discussed today and is in agreement with them.     Lilli Dewald Kirby Funk, MD

## 2022-10-28 ENCOUNTER — Telehealth: Payer: Self-pay | Admitting: Oncology

## 2022-10-28 ENCOUNTER — Other Ambulatory Visit (HOSPITAL_COMMUNITY): Payer: Self-pay

## 2022-10-28 ENCOUNTER — Other Ambulatory Visit: Payer: Self-pay | Admitting: Oncology

## 2022-10-28 ENCOUNTER — Telehealth: Payer: Self-pay | Admitting: Pharmacy Technician

## 2022-10-28 ENCOUNTER — Inpatient Hospital Stay: Payer: Medicare HMO | Attending: Oncology | Admitting: Oncology

## 2022-10-28 VITALS — BP 154/91 | HR 79 | Temp 97.7°F | Resp 18 | Ht 72.0 in | Wt 190.8 lb

## 2022-10-28 DIAGNOSIS — C481 Malignant neoplasm of specified parts of peritoneum: Secondary | ICD-10-CM

## 2022-10-28 DIAGNOSIS — C49A0 Gastrointestinal stromal tumor, unspecified site: Secondary | ICD-10-CM

## 2022-10-28 MED ORDER — REGORAFENIB 40 MG PO TABS: 80.0000 mg | ORAL_TABLET | Freq: Every day | ORAL | 3 refills | Status: DC

## 2022-10-28 NOTE — Telephone Encounter (Signed)
10/28/22 Next appt scheduled and confirmed with patient

## 2022-10-28 NOTE — Telephone Encounter (Signed)
Oral Oncology Patient Advocate Encounter  Called to coordinate refill of Stivarga between patient and [facility].  [employer] confirmed that patient had attempted to schedule shipment earlier this week, but it did not go out as scheduled. No reason was provided for the delay. The patient was able to reschedule a delivery of his medication for tomorrow 10/29/22.  [facility] phone [place], [facility] Patient [place] Direct Number: [phone]  Fax: [phone]

## 2022-11-04 ENCOUNTER — Other Ambulatory Visit: Payer: Self-pay | Admitting: Oncology

## 2022-11-04 ENCOUNTER — Encounter: Payer: Self-pay | Admitting: Oncology

## 2022-11-15 NOTE — Progress Notes (Signed)
  [facility]  Telephone: [phone]  Referring oncologist: Lavera Guise, MD   Clinic Date: 04/21/22  Current therapy: Sutent started on 04/08/22   HISTORY OF PRESENT ILLNESS: Patient is a 72 year old male with metastatic gastrointestinal stromal tumor, including multiple tumor deposits throughout the abdomen. He comes in today to clinic to see how he is doing with the sunitinib after he was switched from imatinib due to disease progression. Overall, patient is doing okay with the therapy. We discussed about using his nausea medications when he becomes nauseaous and patient agrees. He also stated that he is losing some of his appetite which is believed to may be from the medication. We discussed about taking the Megace to see if this may help and he will discuss further with Dr. Bobby Rumpf if this is not helping.   LABS:  Latest Reference Range & Units 04/02/22 00:00 04/21/22 00:00  Sodium 137 - 147  141 (E) 138 (E)  Potassium 3.5 - 5.1 mEq/L 4.0 (E) 4.1 (E)  Chloride 99 - 108  108 (E) 104 (E)  CO2 13 - 22  27 ! (E) 25 ! (E)  Glucose  132 (E) 100 (E)  BUN 4 - 21  15 (E) 12 (E)  Creatinine 0.6 - 1.3  1.4 ! (E) 1.1 (E)  Calcium 8.7 - 10.7  9.2 (E) 9.0 (E)  Alkaline Phosphatase 25 - 125  50 (E) 54 (E)  Albumin 3.5 - 5.0  4.3 (E) 4.1 (E)  AST 14 - 40  25 (E) 17 (E)  ALT 10 - 40 U/L 19 (E) 16 (E)  Bilirubin, Total  0.8 (E) 0.6 (E)    Latest Reference Range & Units 04/02/22 00:00 04/21/22 00:00  WBC  9.9 (E) 10.0 (E)  RBC 3.87 - 5.11  3.74 ! (E) 3.84 ! (E)  Hemoglobin 13.5 - 17.5  12.9 ! (E) 13.3 ! (E)  HCT 41 - 53  38 ! (E) 38 ! (E)  Platelets 150 - 400 K/uL 270 (E) 286 (E)   ASSESSMENT & PLAN: Patient is overall doing well after 2 weeks of therapy with the sutent. We will refer patient to our dietician to see if they may be able to help with his lack of appetite and some helpful tools with increasing his food intake. His fatigue has also  increased since starting the Sutent which I discussed with patient to please notify us if this continues to get worse to where he cannot complete his normal activities. Patient is appreciative about the referral. He denies any missed doses and any additional side effects. Patient is applying cream to his hands and feet daily to decrease the chances of getting hand foot syndrome.    Patient will see Dr. Bobby Rumpf after first cycle of therapy prior to the start of the second cycle. Patient agrees with current plan and will continue the next cycle.   Drema Halon, PharmD Hematology/Oncology Clinical Pharmacist [facility] [phone]

## 2022-11-16 ENCOUNTER — Other Ambulatory Visit: Payer: Self-pay

## 2022-11-16 ENCOUNTER — Other Ambulatory Visit: Payer: Self-pay | Admitting: Oncology

## 2022-11-16 DIAGNOSIS — C49A0 Gastrointestinal stromal tumor, unspecified site: Secondary | ICD-10-CM

## 2022-11-16 MED ORDER — PREDNISONE 20 MG PO TABS: 20.0000 mg | ORAL_TABLET | Freq: Every day | ORAL | 1 refills | Status: DC

## 2022-11-24 NOTE — Progress Notes (Unsigned)
[facility]  [address] [place],  [place]  [address] [phone]  Clinic Day:  11/25/2022   Referring physician: Lonie Peak, PA-C  HISTORY OF PRESENT ILLNESS:  The patient is a 72 y.o. male with metastatic gastrointestinal stromal tumor, including multiple tumor deposits throughout his abdomen.  He comes in today to be evaluated before proceeding with his fifth cycle of regorafenib.  The patient is now taking regorafenib at 80 mg daily, every 3 out of 4 weeks.  Overall, he has done much better since regorafenib dose reduction occurred.  His daily quality of life is much better.  He has occasional abdominal discomfort that is alleviated with pain medication.  He denies having any new symptoms or findings which concern him for overt signs of disease progression.  With respect to this gentleman's metastatic GIST, he was placed on imatinib in August 2021.  His disease was kept under control with this agent until disease progression was seen on scans in August 2023.  This led to him being switched to sunitinib.  However, after just 2 cycles of sunitinib, scans showed disease progression to where he is now on regorafenib.    PHYSICAL EXAM:  Blood pressure (!) 169/90, pulse 83, temperature 97.7 F (36.5 C), resp. rate 16, height 6' (1.829 m), weight 190 lb (86.2 kg), SpO2 95 %. Wt Readings from Last 3 Encounters:  11/25/22 190 lb (86.2 kg)  10/28/22 190 lb 12.8 oz (86.5 kg)  09/09/22 179 lb (81.2 kg)   Body mass index is 25.77 kg/m. Performance status (ECOG): 1 - Symptomatic but completely ambulatory Physical Exam Constitutional:      Appearance: Normal appearance. He is not ill-appearing.  HENT:     Mouth/Throat:     Mouth: Mucous membranes are moist.     Pharynx: Oropharynx is clear. No oropharyngeal exudate or posterior oropharyngeal erythema.  Cardiovascular:     Rate and Rhythm: Normal rate and regular rhythm.     Heart sounds: No murmur  heard.    No friction rub. No gallop.  Pulmonary:     Effort: Pulmonary effort is normal. No respiratory distress.     Breath sounds: Normal breath sounds. No wheezing, rhonchi or rales.  Abdominal:     General: Bowel sounds are normal. There is no distension.     Palpations: Abdomen is soft. There is no mass.     Tenderness: There is no abdominal tenderness.  Musculoskeletal:        General: No swelling.     Right lower leg: No edema.     Left lower leg: No edema.  Lymphadenopathy:     Cervical: No cervical adenopathy.     Upper Body:     Right upper body: No supraclavicular or axillary adenopathy.     Left upper body: No supraclavicular or axillary adenopathy.     Lower Body: No right inguinal adenopathy. No left inguinal adenopathy.  Skin:    General: Skin is warm.     Coloration: Skin is not jaundiced.     Findings: Lesion present. No rash.     Comments: Blisters are present on his bilateral plantar feet  Neurological:     General: No focal deficit present.     Mental Status: He is alert and oriented to person, place, and time. Mental status is at baseline.  Psychiatric:        Mood and Affect: Mood normal.        Behavior: Behavior normal.  Thought Content: Thought content normal.    LABS:     ASSESSMENT & PLAN:  Assessment/Plan:  A 73 y.o. male with metastatic gastrointestinal stromal tumor (GIST).  He will proceed with his 5th cycle of regorafenib at 80 mg daily, every 3/4 weeks.  Clinically, he appears to be doing very well.  I will see him back in 4 weeks before he heads into his 6th cycle of regorafenib.  The patient understands all the plans discussed today and is in agreement with them.     Amado Andal Kirby Funk, MD

## 2022-11-25 ENCOUNTER — Inpatient Hospital Stay: Payer: Medicare HMO | Attending: Oncology | Admitting: Oncology

## 2022-11-25 ENCOUNTER — Inpatient Hospital Stay: Payer: Medicare HMO

## 2022-11-25 ENCOUNTER — Other Ambulatory Visit: Payer: Self-pay | Admitting: Oncology

## 2022-11-25 VITALS — BP 169/90 | HR 83 | Temp 97.7°F | Resp 16 | Ht 72.0 in | Wt 190.0 lb

## 2022-11-25 DIAGNOSIS — C49A0 Gastrointestinal stromal tumor, unspecified site: Secondary | ICD-10-CM

## 2022-11-25 DIAGNOSIS — C481 Malignant neoplasm of specified parts of peritoneum: Secondary | ICD-10-CM | POA: Diagnosis not present

## 2022-11-25 DIAGNOSIS — Z79899 Other long term (current) drug therapy: Secondary | ICD-10-CM | POA: Diagnosis not present

## 2022-11-25 LAB — CBC AND DIFFERENTIAL
HCT: 44 (ref 41–53)
Hemoglobin: 14.8 (ref 13.5–17.5)
Neutrophils Absolute: 13.37
Platelets: 279 10*3/uL (ref 150–400)
WBC: 16.3

## 2022-11-25 LAB — CMP (CANCER CENTER ONLY)
ALT: 18 U/L (ref 0–44)
AST: 19 U/L (ref 15–41)
Albumin: 3.6 g/dL (ref 3.5–5.0)
Alkaline Phosphatase: 51 U/L (ref 38–126)
Anion gap: 9 (ref 5–15)
BUN: 17 mg/dL (ref 8–23)
CO2: 24 mmol/L (ref 22–32)
Calcium: 9 mg/dL (ref 8.9–10.3)
Chloride: 105 mmol/L (ref 98–111)
Creatinine: 0.93 mg/dL (ref 0.61–1.24)
GFR, Estimated: >60 mL/min (ref >60)
Glucose, Bld: 94 mg/dL (ref 70–99)
Potassium: 3.8 mmol/L (ref 3.5–5.1)
Sodium: 138 mmol/L (ref 135–145)
Total Bilirubin: 0.5 mg/dL (ref 0.3–1.2)
Total Protein: 7 g/dL (ref 6.5–8.1)

## 2022-11-25 LAB — TSH: TSH: 2.885 u[IU]/mL (ref 0.350–4.500)

## 2022-11-25 LAB — CBC: RBC: 4.81 (ref 3.87–5.11)

## 2022-12-03 ENCOUNTER — Emergency Department: Payer: Medicare HMO

## 2022-12-03 ENCOUNTER — Other Ambulatory Visit: Payer: Self-pay

## 2022-12-03 ENCOUNTER — Emergency Department
Admission: EM | Admit: 2022-12-03 | Discharge: 2022-12-03 | Disposition: A | Discharge status: Home / Self Care | Payer: Medicare HMO | Attending: Emergency Medicine | Admitting: Emergency Medicine

## 2022-12-03 DIAGNOSIS — I10 Essential (primary) hypertension: Secondary | ICD-10-CM | POA: Insufficient documentation

## 2022-12-03 DIAGNOSIS — R519 Headache, unspecified: Secondary | ICD-10-CM | POA: Diagnosis present

## 2022-12-03 LAB — CBC
HCT: 46.4 % (ref 39.0–52.0)
Hemoglobin: 15.9 g/dL (ref 13.0–17.0)
MCH: 30.6 pg (ref 26.0–34.0)
MCHC: 34.3 g/dL (ref 30.0–36.0)
MCV: 89.2 fL (ref 80.0–100.0)
Platelets: 312 10*3/uL (ref 150–400)
RBC: 5.2 MIL/uL (ref 4.22–5.81)
RDW: 16 % — ABNORMAL HIGH (ref 11.5–15.5)
WBC: 11.8 10*3/uL — ABNORMAL HIGH (ref 4.0–10.5)
nRBC: 0 % (ref 0.0–0.2)

## 2022-12-03 LAB — BASIC METABOLIC PANEL
Anion gap: 9 (ref 5–15)
BUN: 11 mg/dL (ref 8–23)
CO2: 27 mmol/L (ref 22–32)
Calcium: 9.5 mg/dL (ref 8.9–10.3)
Chloride: 104 mmol/L (ref 98–111)
Creatinine, Ser: 0.88 mg/dL (ref 0.61–1.24)
GFR, Estimated: >60 mL/min (ref >60)
Glucose, Bld: 99 mg/dL (ref 70–99)
Potassium: 3.7 mmol/L (ref 3.5–5.1)
Sodium: 140 mmol/L (ref 135–145)

## 2022-12-03 LAB — TROPONIN I (HIGH SENSITIVITY): Troponin I (High Sensitivity): 12 ng/L (ref <18)

## 2022-12-03 MED ORDER — KETOROLAC TROMETHAMINE 30 MG/ML IJ SOLN
15.0000 mg | Freq: Once | INTRAMUSCULAR | Status: AC
  Administered 2022-12-03: 15 mg via INTRAVENOUS
  Filled 2022-12-03: qty 1

## 2022-12-03 MED ORDER — DIPHENHYDRAMINE HCL 50 MG/ML IJ SOLN
25.0000 mg | Freq: Once | INTRAMUSCULAR | Status: AC
  Administered 2022-12-03: 25 mg via INTRAVENOUS
  Filled 2022-12-03: qty 1

## 2022-12-03 MED ORDER — PROCHLORPERAZINE EDISYLATE 10 MG/2ML IJ SOLN
10.0000 mg | Freq: Once | INTRAMUSCULAR | Status: AC
  Administered 2022-12-03: 10 mg via INTRAVENOUS
  Filled 2022-12-03: qty 2

## 2022-12-03 MED ORDER — BUTALBITAL-APAP-CAFFEINE 50-325-40 MG PO TABS
2.0000 | ORAL_TABLET | Freq: Once | ORAL | Status: AC
  Administered 2022-12-03: 2 via ORAL
  Filled 2022-12-03: qty 2

## 2022-12-03 NOTE — ED Triage Notes (Signed)
Pt reports HTN with bp 187/117 at home. Reports associated L shoulder and jaw pain and severe headache. Denies head injury or trauma. Compliant with meds at home. Pt has dx of gastric cancer and receiving treatment. Pt notably uncomfortable in triage with difficulty sitting still for assessment. Pt alert and oriented. Ambulatory to triage.   Of note, pt reports shingles to L arm.

## 2022-12-03 NOTE — Discharge Instructions (Addendum)
Use Tylenol for pain and fevers.  Up to 1000 mg per dose, up to 4 times per day.  Do not take more than 4000 mg of Tylenol/acetaminophen within 24 hours..  Use naproxen/Aleve for anti-inflammatory pain relief. Use up to 500mg every 12 hours. Do not take more frequently than this. Do not use other NSAIDs (ibuprofen, Advil) while taking this medication. It is safe to take Tylenol with this.   

## 2022-12-03 NOTE — ED Provider Notes (Signed)
[facility] Provider Note    Event Date/Time   First MD Initiated Contact with Patient 12/03/22 [phone]     (approximate)   History   Hypertension and Headache   HPI  Jacob Perez is a 72 y.o. male who presents to the ED for evaluation of Hypertension and Headache   I review an oncology clinic visit from 4/11.  History of metastatic GI stromal tumor undergoing regorafenib therapy  Patient presents to the ED for evaluation of a bad headache.  The past 12-15 hours, patient reports a slow onset of occipital headache that radiates bilaterally of his head and neck, bilateral jaw.  Never felt a headache this bad before.  No syncope, falls or injuries.  No fever or vision changes acutely.  Just a bad headache.  His home oxycodone and gabapentin did not have significant effect.  Physical Exam   Triage Vital Signs: ED Triage Vitals  Enc Vitals Group     BP 12/03/22 0116 (!) 172/106     Pulse Rate 12/03/22 0116 85     Resp 12/03/22 0116 (!) 21     Temp 12/03/22 0116 97.7 F (36.5 C)     Temp Source 12/03/22 0116 Oral     SpO2 12/03/22 0116 99 %     Weight 12/03/22 0108 187 lb (84.8 kg)     Height 12/03/22 0108 6' (1.829 m)     Head Circumference --      Peak Flow --      Pain Score 12/03/22 0108 10     Pain Loc --      Pain Edu? --      Excl. in GC? --     Most recent vital signs: Vitals:   12/03/22 0342 12/03/22 0500  BP: (!) 180/101 (!) 166/98  Pulse: 89 83  Resp: 17 13  Temp:  97.9 F (36.6 C)  SpO2: 97% 98%    General: Awake, no distress.  Ambulatory independently with normal gait back to his room, but obviously uncomfortable and photophobic CV:  Good peripheral perfusion.  Resp:  Normal effort.  Abd:  No distention.  MSK:  No deformity noted.  Neuro:  No focal deficits appreciated. Other:     ED Results / Procedures / Treatments   Labs (all labs ordered are listed, but only abnormal results are displayed) Labs Reviewed  CBC -  Abnormal; Notable for the following components:      Result Value   WBC 11.8 (*)    RDW 16.0 (*)    All other components within normal limits  BASIC METABOLIC PANEL  TROPONIN I (HIGH SENSITIVITY)  TROPONIN I (HIGH SENSITIVITY)    EKG Sinus rhythm with a rate of 84 bpm.  Left axis.  No STEMI.  QTc 458  RADIOLOGY CT head interpreted by me without evidence of acute intracranial pathology  Official radiology report(s): CT Head Wo Contrast  Result Date: 12/03/2022 CLINICAL DATA:  Headache with hypertension. EXAM: CT HEAD WITHOUT CONTRAST TECHNIQUE: Contiguous axial images were obtained from the base of the skull through the vertex without intravenous contrast. RADIATION DOSE REDUCTION: This exam was performed according to the departmental dose-optimization program which includes automated exposure control, adjustment of the mA and/or kV according to patient size and/or use of iterative reconstruction technique. COMPARISON:  Head CT 06/12/2022 FINDINGS: Brain: No evidence of acute infarction, hemorrhage, hydrocephalus, extra-axial collection or mass lesion/mass effect. There is mild cerebral atrophy with early small-vessel disease in the cerebral  white matter. Vascular: There are scattered calcifications of the carotid siphons. No hyperdense central vessel is seen. Skull: No fractures or focal lesions. Sinuses/Orbits: There is patchy membrane thickening again noted in the ethmoids. Otherwise clear sinuses and mastoids. Other: None. IMPRESSION: No acute intracranial CT findings or interval changes. Sinus disease. Electronically Signed   By: Almira Bar M.D.   On: 12/03/2022 01:44   DG Chest 1 View  Result Date: 12/03/2022 CLINICAL DATA:  Chest pain EXAM: CHEST  1 VIEW COMPARISON:  CT chest dated 10/27/2022 FINDINGS: Increased interstitial markings. No focal consolidation. 1.8 cm irregular left lower lobe pulmonary nodule, better evaluated on prior CT. No pleural effusion or pneumothorax. The heart  is normal in size.  Median sternotomy. IMPRESSION: 1.8 cm irregular left lower lobe pulmonary nodule, better evaluated on prior CT. No evidence of acute cardiopulmonary disease. Electronically Signed   By: Charline Bills M.D.   On: 12/03/2022 01:29    PROCEDURES and INTERVENTIONS:  Procedures  Medications  butalbital-acetaminophen-caffeine (FIORICET) 50-325-40 MG per tablet 2 tablet (2 tablets Oral Given 12/03/22 0407)  ketorolac (TORADOL) 30 MG/ML injection 15 mg (15 mg Intravenous Given 12/03/22 0407)  diphenhydrAMINE (BENADRYL) injection 25 mg (25 mg Intravenous Given 12/03/22 0406)  prochlorperazine (COMPAZINE) injection 10 mg (10 mg Intravenous Given 12/03/22 0407)     IMPRESSION / MDM / ASSESSMENT AND PLAN / ED COURSE  I reviewed the triage vital signs and the nursing notes.  Differential diagnosis includes, but is not limited to, metastatic tumor, ICH, CVA, migraine, tension headache, temporal arteritis, opiate withdrawal  {Patient presents with symptoms of an acute illness or injury that is potentially life-threatening.  Pleasant 72 year old male presents to the ED with a bad headache without evidence of acute pathology and suitable for outpatient management.  Reassuring examination without signs of trauma or neurologic deficits.  Blood works essentially normal with a CBC, metabolic panel and troponin.  Clear CT head.  Symptoms resolved with migraine cocktail.  As below, may consider an MRI to assess for stroke or tumor contributing to his symptoms but his symptoms resolve and we decided to discharge with close return precautions.  Clinical Course as of 12/03/22 0539  Fri Dec 03, 2022  0446 Reassessed.  Fast asleep, wife is appreciative [DS]  [phone] Reassessed.  Patient reports resolution of headache and feeling at baseline without complaints.  We again discussed MRI but shared decision making yields plan not to pursue this advanced imaging considering the resolution of his symptoms  and reassuring exam.  I think this is reasonable.  We discussed return precautions [DS]    Clinical Course User Index [DS] Delton Prairie, MD     FINAL CLINICAL IMPRESSION(S) / ED DIAGNOSES   Final diagnoses:  Bad headache     Rx / DC Orders   ED Discharge Orders     None        Note:  This document was prepared using Dragon voice recognition software and may include unintentional dictation errors.   Delton Prairie, MD 12/03/22 [phone]

## 2022-12-06 ENCOUNTER — Other Ambulatory Visit: Payer: Self-pay

## 2022-12-13 ENCOUNTER — Other Ambulatory Visit: Payer: Self-pay | Admitting: Oncology

## 2022-12-13 MED ORDER — OXYCODONE HCL 10 MG PO TABS: 10.0000 mg | ORAL_TABLET | ORAL | 0 refills | Status: DC | PRN

## 2022-12-22 NOTE — Progress Notes (Signed)
[facility]  [address] [place],  [place]  [address] [phone]  Clinic Day:  12/23/2022   Referring physician: Lonie Peak, PA-C  HISTORY OF PRESENT ILLNESS:  The patient is a 72 y.o. male with metastatic gastrointestinal stromal tumor, including multiple tumor deposits throughout his abdomen.  He comes in today to be evaluated before proceeding with his 6th cycle of regorafenib.  The patient is now taking regorafenib at 80 mg daily, every 3 out of 4 weeks.  Overall, he has done much better since his regorafenib dose was reduced.  His daily quality of life remains fine. He has occasional abdominal discomfort that is alleviated with pain medication.  He denies having any new symptoms or findings which concern him for overt signs of disease progression.  With respect to this gentleman's metastatic GIST, he was placed on imatinib in August 2021.  His disease was kept under control with this agent until disease progression was seen on scans in August 2023.  This led to him being switched to sunitinib.  However, after just 2 cycles of sunitinib, scans showed disease progression to where he is now on regorafenib.    PHYSICAL EXAM:  Blood pressure 139/80, pulse 83, temperature 97.8 F (36.6 C), temperature source Oral, resp. rate 18, height 6' (1.829 m), weight 185 lb 14.4 oz (84.3 kg), SpO2 95 %. Wt Readings from Last 3 Encounters:  12/23/22 185 lb 14.4 oz (84.3 kg)  12/03/22 187 lb (84.8 kg)  11/25/22 190 lb (86.2 kg)   Body mass index is 25.21 kg/m. Performance status (ECOG): 1 - Symptomatic but completely ambulatory Physical Exam Constitutional:      Appearance: Normal appearance. He is not ill-appearing.  HENT:     Mouth/Throat:     Mouth: Mucous membranes are moist.     Pharynx: Oropharynx is clear. No oropharyngeal exudate or posterior oropharyngeal erythema.  Cardiovascular:     Rate and Rhythm: Normal rate and regular rhythm.     Heart  sounds: No murmur heard.    No friction rub. No gallop.  Pulmonary:     Effort: Pulmonary effort is normal. No respiratory distress.     Breath sounds: Normal breath sounds. No wheezing, rhonchi or rales.  Abdominal:     General: Bowel sounds are normal. There is no distension.     Palpations: Abdomen is soft. There is no mass.     Tenderness: There is no abdominal tenderness.  Musculoskeletal:        General: No swelling.     Right lower leg: No edema.     Left lower leg: No edema.  Lymphadenopathy:     Cervical: No cervical adenopathy.     Upper Body:     Right upper body: No supraclavicular or axillary adenopathy.     Left upper body: No supraclavicular or axillary adenopathy.     Lower Body: No right inguinal adenopathy. No left inguinal adenopathy.  Skin:    General: Skin is warm.     Coloration: Skin is not jaundiced.     Findings: Lesion present. No rash.     Comments: Blisters are present on his bilateral plantar feet  Neurological:     General: No focal deficit present.     Mental Status: He is alert and oriented to person, place, and time. Mental status is at baseline.  Psychiatric:        Mood and Affect: Mood normal.  Behavior: Behavior normal.        Thought Content: Thought content normal.    LABS:    Latest Reference Range & Units 12/23/22 00:00 12/23/22 11:01  Sodium 135 - 145 mmol/L  139  Potassium 3.5 - 5.1 mmol/L  4.1  Chloride 98 - 111 mmol/L  105  CO2 22 - 32 mmol/L  25  Glucose 70 - 99 mg/dL  161 (H)  BUN 8 - 23 mg/dL  24 (H)  Creatinine 0.96 - 1.24 mg/dL  0.45  Calcium 8.9 - 40.9 mg/dL  9.0  Anion gap 5 - 15   9  Alkaline Phosphatase 38 - 126 U/L  46  Albumin 3.5 - 5.0 g/dL  3.9  AST 15 - 41 U/L  18  ALT 0 - 44 U/L  20  Total Protein 6.5 - 8.1 g/dL  7.2  Total Bilirubin 0.3 - 1.2 mg/dL  0.5  GFR, Est Non African American >60 mL/min  >60  CBC W DIFFERENTIAL ( [facility] SCANNED REPORT)   Rpt  WBC  14.8 (E)   RBC 3.87 - 5.11  4.85 (E)    Hemoglobin 13.5 - 17.5  15.1 (E)   HCT 41 - 53  44 (E)   Platelets 150 - 400 K/uL 281 (E)   (H): Data is abnormally high Rpt: View report in Results Review for more information (E): External lab result  ASSESSMENT & PLAN:  Assessment/Plan:  A 72 y.o. male with metastatic gastrointestinal stromal tumor (GIST).  He will proceed with his 6th cycle of regorafenib at 80 mg daily, every 3/4 weeks.  Clinically, he appears to be doing very well.  I will see him back in 4 weeks before he heads into his 7th cycle of regorafenib.  CT scans of his abdomen/pelvis will be done a day before his next visit to ascertain his new disease baseline after 6 cycles of r Egorafenib.  The patient understands all the plans discussed today and is in agreement with them.     Aryella Besecker Kirby Funk, MD

## 2022-12-23 ENCOUNTER — Inpatient Hospital Stay: Payer: Medicare HMO

## 2022-12-23 ENCOUNTER — Inpatient Hospital Stay: Payer: Medicare HMO | Attending: Oncology | Admitting: Oncology

## 2022-12-23 ENCOUNTER — Other Ambulatory Visit: Payer: Self-pay | Admitting: Oncology

## 2022-12-23 VITALS — BP 139/80 | HR 83 | Temp 97.8°F | Resp 18 | Ht 72.0 in | Wt 185.9 lb

## 2022-12-23 DIAGNOSIS — C49A0 Gastrointestinal stromal tumor, unspecified site: Secondary | ICD-10-CM

## 2022-12-23 DIAGNOSIS — C481 Malignant neoplasm of specified parts of peritoneum: Secondary | ICD-10-CM | POA: Diagnosis not present

## 2022-12-23 LAB — CMP (CANCER CENTER ONLY)
ALT: 20 U/L (ref 0–44)
AST: 18 U/L (ref 15–41)
Albumin: 3.9 g/dL (ref 3.5–5.0)
Alkaline Phosphatase: 46 U/L (ref 38–126)
Anion gap: 9 (ref 5–15)
BUN: 24 mg/dL — ABNORMAL HIGH (ref 8–23)
CO2: 25 mmol/L (ref 22–32)
Calcium: 9 mg/dL (ref 8.9–10.3)
Chloride: 105 mmol/L (ref 98–111)
Creatinine: 1.02 mg/dL (ref 0.61–1.24)
GFR, Estimated: >60 mL/min (ref >60)
Glucose, Bld: 132 mg/dL — ABNORMAL HIGH (ref 70–99)
Potassium: 4.1 mmol/L (ref 3.5–5.1)
Sodium: 139 mmol/L (ref 135–145)
Total Bilirubin: 0.5 mg/dL (ref 0.3–1.2)
Total Protein: 7.2 g/dL (ref 6.5–8.1)

## 2022-12-23 LAB — CBC AND DIFFERENTIAL
HCT: 44 (ref 41–53)
Hemoglobin: 15.1 (ref 13.5–17.5)
Neutrophils Absolute: 12.58
Platelets: 281 10*3/uL (ref 150–400)
WBC: 14.8

## 2022-12-23 LAB — CBC: RBC: 4.85 (ref 3.87–5.11)

## 2023-01-09 ENCOUNTER — Other Ambulatory Visit: Payer: Self-pay | Admitting: Oncology

## 2023-01-13 ENCOUNTER — Other Ambulatory Visit: Payer: Self-pay

## 2023-01-19 ENCOUNTER — Other Ambulatory Visit: Payer: Medicare HMO

## 2023-01-19 LAB — CBC AND DIFFERENTIAL
HCT: 47 (ref 41–53)
Hemoglobin: 15.9 (ref 13.5–17.5)
Neutrophils Absolute: 12.67
Platelets: 317 10*3/uL (ref 150–400)
WBC: 17.6

## 2023-01-19 LAB — BASIC METABOLIC PANEL
BUN: 16 (ref 4–21)
CO2: 27 — AB (ref 13–22)
Chloride: 100 (ref 99–108)
Creatinine: 0.8 (ref 0.6–1.3)
Glucose: 109
Potassium: 4.2 mEq/L (ref 3.5–5.1)
Sodium: 137 (ref 137–147)

## 2023-01-19 LAB — COMPREHENSIVE METABOLIC PANEL
Albumin: 4.3 (ref 3.5–5.0)
Calcium: 9.2 (ref 8.7–10.7)

## 2023-01-19 LAB — HEPATIC FUNCTION PANEL
ALT: 19 U/L (ref 10–40)
AST: 24 (ref 14–40)
Alkaline Phosphatase: 69 (ref 25–125)
Bilirubin, Total: 0.8

## 2023-01-19 LAB — CBC: RBC: 5.11 (ref 3.87–5.11)

## 2023-01-19 NOTE — Progress Notes (Signed)
[facility]  [address] [place],  [place]  [address] [phone]  Clinic Day:  01/20/2023   Referring physician: Lonie Peak, PA-C  HISTORY OF PRESENT ILLNESS:  The patient is a 72 y.o. male with metastatic gastrointestinal stromal tumor, including multiple tumor deposits throughout his abdomen.  He comes in today to go over his CT scans to ascertain his new disease baseline after receiving 6 cycles of regorafenib.  The patient is now taking regorafenib at 80 mg daily, every 3 out of 4 weeks.  Unsurprisingly, he has noticed progressive fatigue as he has remained on dysphagia.  He does have occasional abdominal discomfort that is alleviated with pain regimen.  He denies having any new symptoms or findings which concern him for overt signs of disease progression.  With respect to this gentleman's metastatic GIST, he was placed on imatinib in August 2021.  His disease was kept under control with this agent until disease progression was seen on scans in August 2023.  This led to him being switched to sunitinib.  However, after just 2 cycles of sunitinib, scans showed disease progression to where he was switched to regorafenib.    PHYSICAL EXAM:  Blood pressure 124/74, pulse 98, temperature (!) 97.5 F (36.4 C), resp. rate 16, height 6' (1.829 m), weight 179 lb (81.2 kg), SpO2 96 %. Wt Readings from Last 3 Encounters:  01/20/23 179 lb (81.2 kg)  12/23/22 185 lb 14.4 oz (84.3 kg)  12/03/22 187 lb (84.8 kg)   Body mass index is 24.28 kg/m. Performance status (ECOG): 1 - Symptomatic but completely ambulatory Physical Exam Constitutional:      Appearance: Normal appearance. He is not ill-appearing.  HENT:     Mouth/Throat:     Mouth: Mucous membranes are moist.     Pharynx: Oropharynx is clear. No oropharyngeal exudate or posterior oropharyngeal erythema.  Cardiovascular:     Rate and Rhythm: Normal rate and regular rhythm.     Heart sounds: No murmur  heard.    No friction rub. No gallop.  Pulmonary:     Effort: Pulmonary effort is normal. No respiratory distress.     Breath sounds: Normal breath sounds. No wheezing, rhonchi or rales.  Abdominal:     General: Bowel sounds are normal. There is no distension.     Palpations: Abdomen is soft. There is no mass.     Tenderness: There is no abdominal tenderness.  Musculoskeletal:        General: No swelling.     Right lower leg: No edema.     Left lower leg: No edema.  Lymphadenopathy:     Cervical: No cervical adenopathy.     Upper Body:     Right upper body: No supraclavicular or axillary adenopathy.     Left upper body: No supraclavicular or axillary adenopathy.     Lower Body: No right inguinal adenopathy. No left inguinal adenopathy.  Skin:    General: Skin is warm.     Coloration: Skin is not jaundiced.     Findings: No lesion or rash.  Neurological:     General: No focal deficit present.     Mental Status: He is alert and oriented to person, place, and time. Mental status is at baseline.  Psychiatric:        Mood and Affect: Mood normal.        Behavior: Behavior normal.        Thought Content: Thought content normal.  SCANS:  CT scans of his chest/abdomen/pelvis revealed the following:  LABS:    Latest Reference Range & Units 12/23/22 00:00 12/23/22 11:01  Sodium 135 - 145 mmol/L  139  Potassium 3.5 - 5.1 mmol/L  4.1  Chloride 98 - 111 mmol/L  105  CO2 22 - 32 mmol/L  25  Glucose 70 - 99 mg/dL  308 (H)  BUN 8 - 23 mg/dL  24 (H)  Creatinine 6.57 - 1.24 mg/dL  8.46  Calcium 8.9 - 96.2 mg/dL  9.0  Anion gap 5 - 15   9  Alkaline Phosphatase 38 - 126 U/L  46  Albumin 3.5 - 5.0 g/dL  3.9  AST 15 - 41 U/L  18  ALT 0 - 44 U/L  20  Total Protein 6.5 - 8.1 g/dL  7.2  Total Bilirubin 0.3 - 1.2 mg/dL  0.5  GFR, Est Non African American >60 mL/min  >60  CBC W DIFFERENTIAL ([facility] SCANNED REPORT)   Rpt  WBC  14.8 (E)   RBC 3.87 - 5.11  4.85 (E)   Hemoglobin 13.5 -  17.5  15.1 (E)   HCT 41 - 53  44 (E)   Platelets 150 - 400 K/uL 281 (E)   (H): Data is abnormally high Rpt: View report in Results Review for more information (E): External lab result  SCANS:  CT scans of his abdomen/pelvis revealed the following: FINDINGS: CT CHEST FINDINGS  Cardiovascular: The heart size is normal. No substantial pericardial effusion. Coronary artery calcification is evident. Mild atherosclerotic calcification is noted in the wall of the thoracic aorta.  Mediastinum/Nodes: No mediastinal lymphadenopathy. There is no hilar lymphadenopathy. The esophagus has normal imaging features. There is no axillary lymphadenopathy.  Lungs/Pleura: Centrilobular and paraseptal emphysema evident. Left lower lobe pulmonary nodule measured previously at 1.4 x 1.3 cm is 1.7 x 1.4 cm today (image 105/301. 3 mm posterior perifissural nodule in the right lung (58/301) is stable. No new suspicious pulmonary nodule or mass. No pleural effusion.  Musculoskeletal: No worrisome lytic or sclerotic osseous abnormality.  CT ABDOMEN PELVIS FINDINGS  Hepatobiliary: No suspicious focal abnormality within the liver parenchyma. There is no evidence for gallstones, gallbladder wall thickening, or pericholecystic fluid. No intrahepatic or extrahepatic biliary dilation.  Pancreas: No focal mass lesion. No dilatation of the main duct. No intraparenchymal cyst. No peripancreatic edema.  Spleen: No splenomegaly. No focal mass lesion.  Adrenals/Urinary Tract: No adrenal nodule or mass. Kidneys unremarkable. No evidence for hydroureter. The urinary bladder appears normal for the degree of distention.  Stomach/Bowel: Stomach is unremarkable. No gastric wall thickening. No evidence of outlet obstruction. Duodenum is normally positioned as is the ligament of Treitz. No small bowel wall thickening. No small bowel dilatation. The terminal ileum is normal. The appendix is not well visualized, but  there is no edema or inflammation in the region of the cecum. No gross colonic mass. No colonic wall thickening.  Vascular/Lymphatic: There is moderate atherosclerotic calcification of the abdominal aorta without aneurysm. There is no gastrohepatic or hepatoduodenal ligament lymphadenopathy. No retroperitoneal or mesenteric lymphadenopathy. No pelvic sidewall lymphadenopathy.  Reproductive: The prostate gland and seminal vesicles are unremarkable.  Other: No intraperitoneal free fluid.  Lesions along the peritoneum of the high right paracolic gutter are decreased in the interval. Index lesion measured previously at 3.8 cm is 2.5 cm today when measured in the same dimension (image 90/2).  7.5 x 6.5 cm right paracolic gutter mass identified previously is 6.5 x 5.6  cm today. This lesion has become more cystic in the interval with a less prominent soft tissue component.  Soft tissue mass in the right pelvis has decreased measuring 5.9 x 5.4 cm today compared to 6.9 x 6.8 cm previously.  Musculoskeletal: No worrisome lytic or sclerotic osseous abnormality.  IMPRESSION: 1. Interval decrease in size of the right paracolic gutter and right pelvic masses. 2. Left lower lobe pulmonary nodule shows interval slight increase in size. While this may represent differential response of metastatic disease to therapy, primary bronchogenic neoplasm could also have this appearance.. 3. Aortic Atherosclerosis (ICD10-I70.0) and Emphysema (ICD10-J43.9).  ASSESSMENT & PLAN:  Assessment/Plan:  A 72 y.o. male with metastatic gastrointestinal stromal tumor (GIST).  In clinic today, I went over all of his CT scan images with him, for which he could see that his metastatic deposits have somewhat decreased in size.  Based upon this, he will proceed with his 7th cycle of regorafenib at 80 mg daily, every 3/4 weeks.  I made sure the patient understood that his fatigue is likely related to regorafenib therapy.   However, it is already at the lowest dose that I would normally use for his disease management.  If his quality of life takes a significant downturn from his regorafenib, I would consider switching to another agent.  Another option would be to have him undergo a second opinion regarding what can be done for management of his metastatic GIST tumor in the fourth-line setting.  Otherwise, I will see this patient back in 8 weeks for repeat clinical assessment.  The patient understands all the plans discussed today and is in agreement with them.    Jacob Ruppel Kirby Funk, MD

## 2023-01-20 ENCOUNTER — Other Ambulatory Visit: Payer: Self-pay | Admitting: Oncology

## 2023-01-20 ENCOUNTER — Inpatient Hospital Stay: Payer: Medicare HMO | Attending: Oncology | Admitting: Oncology

## 2023-01-20 VITALS — BP 124/74 | HR 98 | Temp 97.5°F | Resp 16 | Ht 72.0 in | Wt 179.0 lb

## 2023-01-20 DIAGNOSIS — C49A0 Gastrointestinal stromal tumor, unspecified site: Secondary | ICD-10-CM

## 2023-01-20 DIAGNOSIS — C481 Malignant neoplasm of specified parts of peritoneum: Secondary | ICD-10-CM

## 2023-01-25 ENCOUNTER — Other Ambulatory Visit (HOSPITAL_COMMUNITY): Payer: Self-pay

## 2023-02-02 ENCOUNTER — Encounter: Payer: Self-pay | Admitting: Oncology

## 2023-02-14 ENCOUNTER — Other Ambulatory Visit: Payer: Self-pay | Admitting: Oncology

## 2023-02-14 MED ORDER — REGORAFENIB 40 MG PO TABS: 80.0000 mg | ORAL_TABLET | Freq: Every day | ORAL | 3 refills | Status: DC

## 2023-02-22 ENCOUNTER — Other Ambulatory Visit: Payer: Self-pay | Admitting: Oncology

## 2023-02-22 MED ORDER — REGORAFENIB 40 MG PO TABS: 80.0000 mg | ORAL_TABLET | Freq: Every day | ORAL | 3 refills | Status: DC

## 2023-03-09 ENCOUNTER — Other Ambulatory Visit: Payer: Self-pay | Admitting: Oncology

## 2023-03-09 MED ORDER — OXYCODONE HCL 10 MG PO TABS: 10.0000 mg | ORAL_TABLET | ORAL | 0 refills | Status: DC | PRN

## 2023-03-09 MED ORDER — MEGESTROL ACETATE 400 MG/10ML PO SUSP: 800.0000 mg | Freq: Every day | ORAL | 2 refills | Status: DC

## 2023-03-22 NOTE — Progress Notes (Unsigned)
[facility]  [address] [place],  [place]  [address] [phone]  Clinic Day:  03/23/2023   Referring physician: Lonie Peak, PA-C  HISTORY OF PRESENT ILLNESS:  The patient is a 72 y.o. male with metastatic gastrointestinal stromal tumor, including multiple tumor deposits throughout his abdomen.  He comes in today to be evaluated before heading into his 8th cycle of regorafenib, which is being given at 80 mg daily, every 3 out of 4 weeks.  Despite being at its lowest dose, his regorafenib continues to cause fatigue.  He does have occasional abdominal discomfort that is alleviated with pain medication.  He denies having any new symptoms or findings which concern him for overt signs of disease progression.  With respect to this gentleman's metastatic GIST, he was placed on imatinib in August 2021.  His disease was kept under control with this agent until disease progression was seen on scans in August 2023.  This led to him being switched to sunitinib.  However, after just 2 cycles of sunitinib, scans showed disease progression to where he was switched to regorafenib.    PHYSICAL EXAM:  Blood pressure 136/72, pulse 95, temperature 98 F (36.7 C), resp. rate 18, height 6' (1.829 m), weight 177 lb 9.6 oz (80.6 kg), SpO2 97%. Wt Readings from Last 3 Encounters:  03/23/23 177 lb 9.6 oz (80.6 kg)  01/20/23 179 lb (81.2 kg)  12/23/22 185 lb 14.4 oz (84.3 kg)   Body mass index is 24.09 kg/m. Performance status (ECOG): 1 - Symptomatic but completely ambulatory Physical Exam Constitutional:      Appearance: Normal appearance. He is not ill-appearing.  HENT:     Mouth/Throat:     Mouth: Mucous membranes are moist.     Pharynx: Oropharynx is clear. No oropharyngeal exudate or posterior oropharyngeal erythema.  Cardiovascular:     Rate and Rhythm: Normal rate and regular rhythm.     Heart sounds: No murmur heard.    No friction rub. No gallop.  Pulmonary:      Effort: Pulmonary effort is normal. No respiratory distress.     Breath sounds: Normal breath sounds. No wheezing, rhonchi or rales.  Abdominal:     General: Bowel sounds are normal. There is no distension.     Palpations: Abdomen is soft. There is no mass.     Tenderness: There is no abdominal tenderness.  Musculoskeletal:        General: No swelling.     Right lower leg: No edema.     Left lower leg: No edema.  Lymphadenopathy:     Cervical: No cervical adenopathy.     Upper Body:     Right upper body: No supraclavicular or axillary adenopathy.     Left upper body: No supraclavicular or axillary adenopathy.     Lower Body: No right inguinal adenopathy. No left inguinal adenopathy.  Skin:    General: Skin is warm.     Coloration: Skin is not jaundiced.     Findings: No lesion or rash.  Neurological:     General: No focal deficit present.     Mental Status: He is alert and oriented to person, place, and time. Mental status is at baseline.  Psychiatric:        Mood and Affect: Mood normal.        Behavior: Behavior normal.        Thought Content: Thought content normal.    ASSESSMENT & PLAN:  Assessment/Plan:  A 72 y.o. male with metastatic gastrointestinal stromal tumor (GIST).  The patient has already proceeded with his 9th cycle of regorafenib, which he started today.  Per his request, I will prescribe him another course of prednisone for the month to help with both his appetite and his sense of wellbeing.  Otherwise, I will see him back in 8 weeks, which will be after he completes his 10th cycle of regorafenib.  Scans will be done a day before his next visit to ascertain his new disease baseline after 10 cycles of treatment.  The patient understands all the plans discussed today and is in agreement with them.     Kirby Funk, MD

## 2023-03-23 ENCOUNTER — Other Ambulatory Visit: Payer: Self-pay | Admitting: Oncology

## 2023-03-23 ENCOUNTER — Inpatient Hospital Stay: Payer: Medicare HMO | Attending: Oncology | Admitting: Oncology

## 2023-03-23 VITALS — BP 136/72 | HR 95 | Temp 98.0°F | Resp 18 | Ht 72.0 in | Wt 177.6 lb

## 2023-03-23 DIAGNOSIS — C49A0 Gastrointestinal stromal tumor, unspecified site: Secondary | ICD-10-CM

## 2023-03-23 DIAGNOSIS — C481 Malignant neoplasm of specified parts of peritoneum: Secondary | ICD-10-CM | POA: Diagnosis not present

## 2023-03-23 MED ORDER — PREDNISONE 20 MG PO TABS: 20.0000 mg | ORAL_TABLET | Freq: Every day | ORAL | 1 refills | Status: DC

## 2023-05-17 LAB — CBC AND DIFFERENTIAL
HCT: 42 (ref 41–53)
Hemoglobin: 14.1 (ref 13.5–17.5)
Neutrophils Absolute: 7.67
Platelets: 291 10*3/uL (ref 150–400)
WBC: 13

## 2023-05-17 LAB — BASIC METABOLIC PANEL
BUN: 16 (ref 4–21)
CO2: 27 — AB (ref 13–22)
Chloride: 107 (ref 99–108)
Creatinine: 0.9 (ref 0.6–1.3)
Glucose: 99
Potassium: 5.1 meq/L (ref 3.5–5.1)
Sodium: 138 (ref 137–147)

## 2023-05-17 LAB — HEPATIC FUNCTION PANEL
ALT: 15 U/L (ref 10–40)
AST: 24 (ref 14–40)
Alkaline Phosphatase: 44 (ref 25–125)
Bilirubin, Total: 0.5

## 2023-05-17 LAB — COMPREHENSIVE METABOLIC PANEL
Albumin: 3.9 (ref 3.5–5.0)
Calcium: 9.5 (ref 8.7–10.7)

## 2023-05-17 LAB — CBC: RBC: 4.35 (ref 3.87–5.11)

## 2023-05-17 NOTE — Progress Notes (Unsigned)
[facility]  [address] [place],  [place]  [address] [phone]  Clinic Day:  03/23/2023   Referring physician: Lonie Peak, PA-C  HISTORY OF PRESENT ILLNESS:  The patient is a 72 y.o. male with metastatic gastrointestinal stromal tumor, including multiple tumor deposits throughout his abdomen.  He comes in today to be evaluated before heading into his 8th cycle of regorafenib, which is being given at 80 mg daily, every 3 out of 4 weeks.  Despite being at its lowest dose, his regorafenib continues to cause fatigue.  He does have occasional abdominal discomfort that is alleviated with pain medication.  He denies having any new symptoms or findings which concern him for overt signs of disease progression.  With respect to this gentleman's metastatic GIST, he was placed on imatinib in August 2021.  His disease was kept under control with this agent until disease progression was seen on scans in August 2023.  This led to him being switched to sunitinib.  However, after just 2 cycles of sunitinib, scans showed disease progression to where he was switched to regorafenib.    PHYSICAL EXAM:  There were no vitals taken for this visit. Wt Readings from Last 3 Encounters:  03/23/23 177 lb 9.6 oz (80.6 kg)  01/20/23 179 lb (81.2 kg)  12/23/22 185 lb 14.4 oz (84.3 kg)   There is no height or weight on file to calculate BMI. Performance status (ECOG): 1 - Symptomatic but completely ambulatory Physical Exam Constitutional:      Appearance: Normal appearance. He is not ill-appearing.  HENT:     Mouth/Throat:     Mouth: Mucous membranes are moist.     Pharynx: Oropharynx is clear. No oropharyngeal exudate or posterior oropharyngeal erythema.  Cardiovascular:     Rate and Rhythm: Normal rate and regular rhythm.     Heart sounds: No murmur heard.    No friction rub. No gallop.  Pulmonary:     Effort: Pulmonary effort is normal. No respiratory distress.      Breath sounds: Normal breath sounds. No wheezing, rhonchi or rales.  Abdominal:     General: Bowel sounds are normal. There is no distension.     Palpations: Abdomen is soft. There is no mass.     Tenderness: There is no abdominal tenderness.  Musculoskeletal:        General: No swelling.     Right lower leg: No edema.     Left lower leg: No edema.  Lymphadenopathy:     Cervical: No cervical adenopathy.     Upper Body:     Right upper body: No supraclavicular or axillary adenopathy.     Left upper body: No supraclavicular or axillary adenopathy.     Lower Body: No right inguinal adenopathy. No left inguinal adenopathy.  Skin:    General: Skin is warm.     Coloration: Skin is not jaundiced.     Findings: No lesion or rash.  Neurological:     General: No focal deficit present.     Mental Status: He is alert and oriented to person, place, and time. Mental status is at baseline.  Psychiatric:        Mood and Affect: Mood normal.        Behavior: Behavior normal.        Thought Content: Thought content normal.   ASSESSMENT & PLAN:  Assessment/Plan:  A 72 y.o. male with metastatic gastrointestinal stromal tumor (GIST).  The patient has  already proceeded with his 9th cycle of regorafenib, which he started today.  Per his request, I will prescribe him another course of prednisone for the month to help with both his appetite and his sense of wellbeing.  Otherwise, I will see him back in 8 weeks, which will be after he completes his 10th cycle of regorafenib.  Scans will be done a day before his next visit to ascertain his new disease baseline after 10 cycles of treatment.  The patient understands all the plans discussed today and is in agreement with them.    Lauria Depoy Kirby Funk, MD

## 2023-05-18 ENCOUNTER — Other Ambulatory Visit: Payer: Self-pay | Admitting: Oncology

## 2023-05-18 ENCOUNTER — Inpatient Hospital Stay: Payer: Medicare HMO | Attending: Oncology | Admitting: Oncology

## 2023-05-18 VITALS — BP 137/73 | HR 71 | Temp 98.6°F | Resp 16 | Ht 72.0 in | Wt 181.7 lb

## 2023-05-18 DIAGNOSIS — C481 Malignant neoplasm of specified parts of peritoneum: Secondary | ICD-10-CM

## 2023-05-19 ENCOUNTER — Telehealth: Payer: Self-pay | Admitting: Oncology

## 2023-05-19 ENCOUNTER — Other Ambulatory Visit: Payer: Self-pay | Admitting: Oncology

## 2023-05-19 NOTE — Telephone Encounter (Signed)
Patient has been scheduled. Aware of appt date and time.   Scheduling Message Entered by Rennis Harding A on 05/18/2023 at 12:32 PM Priority: Routine <No visit type provided>  Department: [facility]  Provider:  Scheduling Notes:  Labs/appt 07-13-23

## 2023-06-02 ENCOUNTER — Encounter: Payer: Self-pay | Admitting: Oncology

## 2023-06-02 ENCOUNTER — Other Ambulatory Visit: Payer: Self-pay | Admitting: Hematology and Oncology

## 2023-06-02 MED ORDER — REGORAFENIB 40 MG PO TABS: 80.0000 mg | ORAL_TABLET | Freq: Every day | ORAL | 3 refills | Status: DC

## 2023-06-09 ENCOUNTER — Other Ambulatory Visit: Payer: Self-pay | Admitting: Hematology and Oncology

## 2023-06-09 MED ORDER — OXYCODONE HCL 10 MG PO TABS: 10.0000 mg | ORAL_TABLET | ORAL | 0 refills | Status: DC | PRN

## 2023-06-22 ENCOUNTER — Telehealth: Payer: Self-pay | Admitting: Pharmacy Technician

## 2023-06-22 NOTE — Telephone Encounter (Signed)
Oral Oncology Patient Advocate Encounter   Received notification that patient is due for re-enrollment for assistance for Stivarga through [employer].   Re-enrollment process has been initiated and will be submitted upon completion of necessary documents.  [employer] phone number [phone].   I will continue to follow until final determination.  Jinger Neighbors, CPhT-Adv Oncology Pharmacy Patient Advocate [facility] Direct Number: [phone]  Fax: [phone]

## 2023-06-28 ENCOUNTER — Other Ambulatory Visit: Payer: Self-pay | Admitting: Oncology

## 2023-06-28 NOTE — Telephone Encounter (Signed)
Oral Oncology Patient Advocate Encounter   Submitted application for assistance for Stivarga to [employer].   Application submitted via e-fax to [phone]   [employer] phone number [phone].   I will continue to check the status until final determination.   Lady Deutscher, CPhT-Adv Oncology Pharmacy Patient [place] Direct Number: [phone]  Fax: [phone]

## 2023-06-29 NOTE — Telephone Encounter (Signed)
Oral Oncology Patient Advocate Encounter   Received notification re-enrollment for assistance for Stivarga through [employer] has been approved. Patient may continue to receive their medication at $0 from this program.    [employer] phone number [phone].   Effective dates: 08/17/23 through 08/15/24  I have spoken to the patient.  Jinger Neighbors, CPhT-Adv Oncology Pharmacy Patient Advocate [facility] Direct Number: [phone]  Fax: [phone]

## 2023-07-11 NOTE — Progress Notes (Unsigned)
[facility]  [address] [place],  [place]  [address] [phone]  Clinic Day:  07/12/2023   Referring physician: Lonie Peak, PA-C  HISTORY OF PRESENT ILLNESS:  The patient is a 72 y.o. male with metastatic gastrointestinal stromal tumor, including multiple tumor deposits throughout his abdomen.  He comes in today to be evaluated before heading into his 13th cycle of regorafenib, which is being given at 80 mg daily, every 3 out of 4 weeks.  The patient has tolerated his past 2 cycles of regorafenib fairly well.  He has had more right-sided abdominal pain, which he attributes to his GIST tumor.  Otherwise, he denies having any new symptoms or findings which concern him for overt signs of disease progression.  With respect to this gentleman's metastatic GIST, he was placed on imatinib in August 2021.  His disease was kept under control with this agent until disease progression was seen on scans in August 2023.  This led to him being switched to sunitinib.  However, after just 2 cycles of sunitinib, scans showed disease progression to where he was switched to regorafenib.    PHYSICAL EXAM:  Blood pressure 132/84, pulse 88, temperature 98.2 F (36.8 C), resp. rate 16, height 6' (1.829 m), weight 175 lb 1.6 oz (79.4 kg), SpO2 100%. Wt Readings from Last 3 Encounters:  07/12/23 175 lb 1.6 oz (79.4 kg)  05/18/23 181 lb 11.2 oz (82.4 kg)  03/23/23 177 lb 9.6 oz (80.6 kg)   Body mass index is 23.75 kg/m. Performance status (ECOG): 1 - Symptomatic but completely ambulatory Physical Exam Constitutional:      Appearance: Normal appearance. He is not ill-appearing.  HENT:     Mouth/Throat:     Mouth: Mucous membranes are moist.     Pharynx: Oropharynx is clear. No oropharyngeal exudate or posterior oropharyngeal erythema.  Cardiovascular:     Rate and Rhythm: Normal rate and regular rhythm.     Heart sounds: No murmur heard.    No friction rub. No  gallop.  Pulmonary:     Effort: Pulmonary effort is normal. No respiratory distress.     Breath sounds: Normal breath sounds. No wheezing, rhonchi or rales.  Abdominal:     General: Bowel sounds are normal. There is no distension.     Palpations: Abdomen is soft. There is no mass.     Tenderness: There is no abdominal tenderness.  Musculoskeletal:        General: No swelling.     Right lower leg: No edema.     Left lower leg: No edema.  Lymphadenopathy:     Cervical: No cervical adenopathy.     Upper Body:     Right upper body: No supraclavicular or axillary adenopathy.     Left upper body: No supraclavicular or axillary adenopathy.     Lower Body: No right inguinal adenopathy. No left inguinal adenopathy.  Skin:    General: Skin is warm.     Coloration: Skin is not jaundiced.     Findings: No lesion or rash.  Neurological:     General: No focal deficit present.     Mental Status: He is alert and oriented to person, place, and time. Mental status is at baseline.  Psychiatric:        Mood and Affect: Mood normal.        Behavior: Behavior normal.        Thought Content: Thought content normal.    ASSESSMENT &  PLAN:  Assessment/Plan:  A 72 y.o. male with metastatic gastrointestinal stromal tumor (GIST).  He will proceed with his 13th cycle of regorafenib today.  Clinically, he appears to be doing fairly well.  I will see this patient back in 8 weeks before he heads into a potential 15th cycle of treatment.  CT scans will be done a day before his next visit to ascertain his new disease baseline after 14 cycles of regorafenib.  The patient understands all the plans discussed today and is in agreement with them.    Karson Reede Kirby Funk, MD

## 2023-07-12 ENCOUNTER — Inpatient Hospital Stay: Payer: Medicare HMO | Attending: Oncology | Admitting: Oncology

## 2023-07-12 ENCOUNTER — Inpatient Hospital Stay: Payer: Medicare HMO

## 2023-07-12 ENCOUNTER — Telehealth: Payer: Self-pay | Admitting: Oncology

## 2023-07-12 ENCOUNTER — Other Ambulatory Visit: Payer: Self-pay | Admitting: Oncology

## 2023-07-12 VITALS — BP 132/84 | HR 88 | Temp 98.2°F | Resp 16 | Ht 72.0 in | Wt 175.1 lb

## 2023-07-12 DIAGNOSIS — C49A0 Gastrointestinal stromal tumor, unspecified site: Secondary | ICD-10-CM | POA: Diagnosis present

## 2023-07-12 DIAGNOSIS — C481 Malignant neoplasm of specified parts of peritoneum: Secondary | ICD-10-CM | POA: Diagnosis not present

## 2023-07-12 LAB — CBC WITH DIFFERENTIAL (CANCER CENTER ONLY)
Abs Immature Granulocytes: 0.05 10*3/uL (ref 0.00–0.07)
Basophils Absolute: 0 10*3/uL (ref 0.0–0.1)
Basophils Relative: 0 %
Eosinophils Absolute: 0 10*3/uL (ref 0.0–0.5)
Eosinophils Relative: 0 %
HCT: 40.4 % (ref 39.0–52.0)
Hemoglobin: 14.1 g/dL (ref 13.0–17.0)
Immature Granulocytes: 0 %
Lymphocytes Relative: 18 %
Lymphs Abs: 2.3 10*3/uL (ref 0.7–4.0)
MCH: 31.9 pg (ref 26.0–34.0)
MCHC: 34.9 g/dL (ref 30.0–36.0)
MCV: 91.4 fL (ref 80.0–100.0)
Monocytes Absolute: 0.4 10*3/uL (ref 0.1–1.0)
Monocytes Relative: 3 %
Neutro Abs: 10 10*3/uL — ABNORMAL HIGH (ref 1.7–7.7)
Neutrophils Relative %: 79 %
Platelet Count: 309 10*3/uL (ref 150–400)
RBC: 4.42 MIL/uL (ref 4.22–5.81)
RDW: 13.6 % (ref 11.5–15.5)
WBC Count: 12.8 10*3/uL — ABNORMAL HIGH (ref 4.0–10.5)
nRBC: 0 % (ref 0.0–0.2)
nRBC: 0 /100{WBCs}

## 2023-07-12 LAB — CMP (CANCER CENTER ONLY)
ALT: 9 U/L (ref 0–44)
AST: 16 U/L (ref 15–41)
Albumin: 4.4 g/dL (ref 3.5–5.0)
Alkaline Phosphatase: 46 U/L (ref 38–126)
Anion gap: 13 (ref 5–15)
BUN: 18 mg/dL (ref 8–23)
CO2: 21 mmol/L — ABNORMAL LOW (ref 22–32)
Calcium: 10.3 mg/dL (ref 8.9–10.3)
Chloride: 107 mmol/L (ref 98–111)
Creatinine: 1.14 mg/dL (ref 0.61–1.24)
GFR, Estimated: >60 mL/min (ref >60)
Glucose, Bld: 173 mg/dL — ABNORMAL HIGH (ref 70–99)
Potassium: 4.6 mmol/L (ref 3.5–5.1)
Sodium: 141 mmol/L (ref 135–145)
Total Bilirubin: 0.4 mg/dL (ref <1.2)
Total Protein: 7.7 g/dL (ref 6.5–8.1)

## 2023-07-12 NOTE — Telephone Encounter (Signed)
Patient has been scheduled for follow-up visit per 07/12/23 LOS.  Pt given an appt calendar with date and time.

## 2023-08-19 ENCOUNTER — Telehealth: Payer: Self-pay | Admitting: Oncology

## 2023-08-19 NOTE — Telephone Encounter (Signed)
 CT C/A/P has been scheduled for 09/05/23; Check in @ 12   Notified pt of date,time and instructions.

## 2023-09-05 LAB — LAB REPORT - SCANNED: EGFR: 92

## 2023-09-06 ENCOUNTER — Other Ambulatory Visit: Payer: Self-pay | Admitting: Oncology

## 2023-09-06 ENCOUNTER — Inpatient Hospital Stay: Payer: Medicare HMO | Attending: Oncology | Admitting: Oncology

## 2023-09-06 VITALS — BP 147/80 | HR 76 | Temp 97.8°F | Resp 18 | Ht 72.0 in | Wt 177.5 lb

## 2023-09-06 DIAGNOSIS — C49A0 Gastrointestinal stromal tumor, unspecified site: Secondary | ICD-10-CM

## 2023-09-06 MED ORDER — PREDNISONE 20 MG PO TABS: 20.0000 mg | ORAL_TABLET | Freq: Every day | ORAL | 1 refills | Status: DC

## 2023-09-06 MED ORDER — OXYCODONE HCL 10 MG PO TABS: 10.0000 mg | ORAL_TABLET | ORAL | 0 refills | Status: DC | PRN

## 2023-09-06 NOTE — Progress Notes (Incomplete)
[facility]  [address] [place],  [place]  [address] [phone]  Clinic Day:  07/12/2023   Referring physician: Lonie Peak, PA-C  HISTORY OF PRESENT ILLNESS:  The patient is a 73 y.o. male with metastatic gastrointestinal stromal tumor, including multiple tumor deposits throughout his abdomen.  He comes in today to be evaluated before heading into his 13th cycle of regorafenib, which is being given at 80 mg daily, every 3 out of 4 weeks.  The patient has tolerated his past 2 cycles of regorafenib fairly well.  He has had more right-sided abdominal pain, which he attributes to his GIST tumor.  Otherwise, he denies having any new symptoms or findings which concern him for overt signs of disease progression.  With respect to this gentleman's metastatic GIST, he was placed on imatinib in August 2021.  His disease was kept under control with this agent until disease progression was seen on scans in August 2023.  This led to him being switched to sunitinib.  However, after just 2 cycles of sunitinib, scans showed disease progression to where he was switched to regorafenib.    PHYSICAL EXAM:  There were no vitals taken for this visit. Wt Readings from Last 3 Encounters:  07/12/23 175 lb 1.6 oz (79.4 kg)  05/18/23 181 lb 11.2 oz (82.4 kg)  03/23/23 177 lb 9.6 oz (80.6 kg)   There is no height or weight on file to calculate BMI. Performance status (ECOG): 1 - Symptomatic but completely ambulatory Physical Exam Constitutional:      Appearance: Normal appearance. He is not ill-appearing.  HENT:     Mouth/Throat:     Mouth: Mucous membranes are moist.     Pharynx: Oropharynx is clear. No oropharyngeal exudate or posterior oropharyngeal erythema.  Cardiovascular:     Rate and Rhythm: Normal rate and regular rhythm.     Heart sounds: No murmur heard.    No friction rub. No gallop.  Pulmonary:     Effort: Pulmonary effort is normal. No respiratory  distress.     Breath sounds: Normal breath sounds. No wheezing, rhonchi or rales.  Abdominal:     General: Bowel sounds are normal. There is no distension.     Palpations: Abdomen is soft. There is no mass.     Tenderness: There is no abdominal tenderness.  Musculoskeletal:        General: No swelling.     Right lower leg: No edema.     Left lower leg: No edema.  Lymphadenopathy:     Cervical: No cervical adenopathy.     Upper Body:     Right upper body: No supraclavicular or axillary adenopathy.     Left upper body: No supraclavicular or axillary adenopathy.     Lower Body: No right inguinal adenopathy. No left inguinal adenopathy.  Skin:    General: Skin is warm.     Coloration: Skin is not jaundiced.     Findings: No lesion or rash.  Neurological:     General: No focal deficit present.     Mental Status: He is alert and oriented to person, place, and time. Mental status is at baseline.  Psychiatric:        Mood and Affect: Mood normal.        Behavior: Behavior normal.        Thought Content: Thought content normal.    ASSESSMENT & PLAN:  Assessment/Plan:  A 73 y.o. male with metastatic gastrointestinal stromal  tumor (GIST).  He will proceed with his 13th cycle of regorafenib today.  Clinically, he appears to be doing fairly well.  I will see this patient back in 8 weeks before he heads into a potential 15th cycle of treatment.  CT scans will be done a day before his next visit to ascertain his new disease baseline after 14 cycles of regorafenib.  The patient understands all the plans discussed today and is in agreement with them.    Christeena Krogh Kirby Funk, MD

## 2023-09-06 NOTE — Progress Notes (Signed)
[facility]  [address] [place],  [place]  [address] [phone]  Clinic Day:  07/12/2023   Referring physician: Lonie Peak, PA-C  HISTORY OF PRESENT ILLNESS:  The patient is a 73 y.o. male with metastatic gastrointestinal stromal tumor, including multiple tumor deposits throughout his abdomen.  He comes in today to go over his CT scans to ascertain his new disease baseline after receiving 14 cycles of regorafenib, which is being given at 80 mg daily, every 3 out of 4 weeks.  The patient has tolerated his past 2 cycles of regorafenib fairly well.  Fatigue remains an issue, which is common with this agent.  He does have intermittent abdominal pain at the sites of where his cancer deposits are located.  However, he denies having any new symptoms or findings which concern him for overt signs of disease progression.  With respect to this gentleman's metastatic GIST, he was placed on imatinib in August 2021.  His disease was kept under control with this agent until disease progression was seen on scans in August 2023.  This led to him being switched to sunitinib.  However, after just 2 cycles of sunitinib, scans showed disease progression to where he was switched to regorafenib.    PHYSICAL EXAM:  Blood pressure (!) 147/80, pulse 76, temperature 97.8 F (36.6 C), temperature source Oral, resp. rate 18, height 6' (1.829 m), weight 177 lb 8 oz (80.5 kg), SpO2 100%. Wt Readings from Last 3 Encounters:  09/06/23 177 lb 8 oz (80.5 kg)  07/12/23 175 lb 1.6 oz (79.4 kg)  05/18/23 181 lb 11.2 oz (82.4 kg)   Body mass index is 24.07 kg/m. Performance status (ECOG): 1 - Symptomatic but completely ambulatory Physical Exam Constitutional:      Appearance: Normal appearance. He is not ill-appearing.  HENT:     Mouth/Throat:     Mouth: Mucous membranes are moist.     Pharynx: Oropharynx is clear. No oropharyngeal exudate or posterior oropharyngeal erythema.   Cardiovascular:     Rate and Rhythm: Normal rate and regular rhythm.     Heart sounds: No murmur heard.    No friction rub. No gallop.  Pulmonary:     Effort: Pulmonary effort is normal. No respiratory distress.     Breath sounds: Normal breath sounds. No wheezing, rhonchi or rales.  Abdominal:     General: Bowel sounds are normal. There is no distension.     Palpations: Abdomen is soft. There is no mass.     Tenderness: There is no abdominal tenderness.  Musculoskeletal:        General: No swelling.     Right lower leg: No edema.     Left lower leg: No edema.  Lymphadenopathy:     Cervical: No cervical adenopathy.     Upper Body:     Right upper body: No supraclavicular or axillary adenopathy.     Left upper body: No supraclavicular or axillary adenopathy.     Lower Body: No right inguinal adenopathy. No left inguinal adenopathy.  Skin:    General: Skin is warm.     Coloration: Skin is not jaundiced.     Findings: No lesion or rash.  Neurological:     General: No focal deficit present.     Mental Status: He is alert and oriented to person, place, and time. Mental status is at baseline.  Psychiatric:        Mood and Affect: Mood normal.  Behavior: Behavior normal.        Thought Content: Thought content normal.   SCANS:  CT scans of her chest/abdomen/pelvis revealed the following: FINDINGS: CHEST:  Lungs/pleura: Bilateral centrilobular and paraseptal emphysema.  Mediastinal/ hilar lymph nodes: Unremarkable. Bones: Normal thoracic alignment. Axilla: No lymphadenopathy. Soft tissue: Unremarkable. Thyroid: Any visualized thyroid tissue is unremarkable.  Aorta : Nonaneurysmal.  Central pulmonary arteries: Normal morphology.   ABDOMEN AND PELVIS:  Upper GI tract: Unremarkable.  Liver: The visualized hepatic parenchyma is unremarkable. Gallbladder: Non-dilated. Biliary System: Unremarkable for age. Spleen: Normal size. Pancreas: Unremarkable.  Adrenal  glands: Unremarkable. Kidneys/ureters: Symmetric nephrograms; No hydronephrosis. Ureters are unremarkable.  Veins: Normal opacification of the SMV, portal vein, and IVC. Lymph nodes: No adenopathy.  Small bowel: 6.5 x 5.9 cm mass within it distal ileum. Colon: Unremarkable. Appendix: No finding of appendicitis. Peritoneum: No free air or fluid.  GU: Unremarkable.  Bones: Normal lumbar vertebral alignment.  Soft tissue including the abdominal wall: No acute abnormal finding.   ABDOMINAL AORTA: Normal caliber with no evidence of aneurysm.  IMPRESSION: 6.5 x 5.9 cm mass within the distal ileum worrisome for malignancy.  Bilateral centrilobular and paraseptal emphysema. ASSESSMENT & PLAN:  Assessment/Plan:  A 73 y.o. male with metastatic gastrointestinal stromal tumor (GIST).  In clinic today, I went over all of his CT scan images with him, and compared them to those done in October 2024.  Overall, I see no significant changes in the sizes of his tumor deposits to I think a change in therapy is warranted.  Furthermore, his disease is not in his liver or other vital organs that could lead to significant morbidity/mortality.  This is the likely reason why his disease has done very well over these past 3+ years.  He will proceed with his 15th cycle of regorafenib today.  Clinically, he appears to be doing fairly well.  I will see this patient back in 8 weeks before he heads into a potential 17th cycle of treatment.  The patient understands all the plans discussed today and is in agreement with them.    Audreanna Torrisi Kirby Funk, MD

## 2023-09-08 ENCOUNTER — Encounter: Payer: Self-pay | Admitting: Oncology

## 2023-09-27 ENCOUNTER — Other Ambulatory Visit: Payer: Self-pay | Admitting: Oncology

## 2023-09-27 MED ORDER — REGORAFENIB 40 MG PO TABS: 80.0000 mg | ORAL_TABLET | Freq: Every day | ORAL | 3 refills | Status: DC

## 2023-10-31 NOTE — Progress Notes (Unsigned)
 [facility]  [address] [place],  [place]  [address] [phone]  Clinic Day:  11/01/2023  Referring physician: Lonie Peak, PA-C  HISTORY OF PRESENT ILLNESS:  The patient is a 73 y.o. male with metastatic gastrointestinal stromal tumor, including multiple tumor deposits throughout his abdomen.  He comes in today to be evaluated before heading into his 17th cycle of regorafenib, which is being given at 80 mg daily, every 3 out of 4 weeks.  The patient has tolerated his past 2 cycles of regorafenib fairly well.  Fatigue remains an issue, but it has not been as prominent as it has been previously.  He does have intermittent abdominal pain at the sites of where his cancer deposits are located.  However, he denies having any new symptoms or findings which concern him for overt signs of disease progression.  With respect to this gentleman's metastatic GIST, he was placed on imatinib in August 2021.  His disease was kept under control with this agent until disease progression was seen on scans in August 2023.  This led to him being switched to sunitinib.  However, after just 2 cycles of sunitinib, scans showed disease progression to where he was switched to regorafenib.    PHYSICAL EXAM:  Blood pressure (!) 155/95, pulse 77, temperature 98.1 F (36.7 C), temperature source Oral, resp. rate 18, height 6' (1.829 m), weight 175 lb 9.6 oz (79.7 kg), SpO2 97%. Wt Readings from Last 3 Encounters:  11/01/23 175 lb 9.6 oz (79.7 kg)  09/06/23 177 lb 8 oz (80.5 kg)  07/12/23 175 lb 1.6 oz (79.4 kg)   Body mass index is 23.82 kg/m. Performance status (ECOG): 1 - Symptomatic but completely ambulatory Physical Exam Constitutional:      Appearance: Normal appearance. He is not ill-appearing.  HENT:     Mouth/Throat:     Mouth: Mucous membranes are moist.     Pharynx: Oropharynx is clear. No oropharyngeal exudate or posterior oropharyngeal erythema.   Cardiovascular:     Rate and Rhythm: Normal rate and regular rhythm.     Heart sounds: No murmur heard.    No friction rub. No gallop.  Pulmonary:     Effort: Pulmonary effort is normal. No respiratory distress.     Breath sounds: Normal breath sounds. No wheezing, rhonchi or rales.  Abdominal:     General: Bowel sounds are normal. There is no distension.     Palpations: Abdomen is soft. There is no mass.     Tenderness: There is no abdominal tenderness.  Musculoskeletal:        General: No swelling.     Right lower leg: No edema.     Left lower leg: No edema.  Lymphadenopathy:     Cervical: No cervical adenopathy.     Upper Body:     Right upper body: No supraclavicular or axillary adenopathy.     Left upper body: No supraclavicular or axillary adenopathy.     Lower Body: No right inguinal adenopathy. No left inguinal adenopathy.  Skin:    General: Skin is warm.     Coloration: Skin is not jaundiced.     Findings: No lesion or rash.  Neurological:     General: No focal deficit present.     Mental Status: He is alert and oriented to person, place, and time. Mental status is at baseline.  Psychiatric:        Mood and Affect: Mood normal.  Behavior: Behavior normal.        Thought Content: Thought content normal.    ASSESSMENT & PLAN:  Assessment/Plan:  A 73 y.o. male with metastatic gastrointestinal stromal tumor (GIST).  He will proceed with his 17th cycle of regorafenib today.  Clinically, he appears to be doing fairly well.  I will see this patient back in 8 weeks before he heads into a potential 19th cycle of treatment.  CT scans will be done a day before his next visit to ascertain his new disease baseline after 18 cycles of regorafenib.    The patient understands all the plans discussed today and is in agreement with them.    Adian Jablonowski Kirby Funk, MD

## 2023-11-01 ENCOUNTER — Telehealth: Payer: Self-pay | Admitting: Oncology

## 2023-11-01 ENCOUNTER — Inpatient Hospital Stay: Payer: Medicare HMO | Attending: Oncology | Admitting: Oncology

## 2023-11-01 ENCOUNTER — Other Ambulatory Visit: Payer: Self-pay | Admitting: Oncology

## 2023-11-01 ENCOUNTER — Inpatient Hospital Stay: Payer: Medicare HMO

## 2023-11-01 VITALS — BP 155/95 | HR 77 | Temp 98.1°F | Resp 18 | Ht 72.0 in | Wt 175.6 lb

## 2023-11-01 DIAGNOSIS — C49A0 Gastrointestinal stromal tumor, unspecified site: Secondary | ICD-10-CM | POA: Diagnosis present

## 2023-11-01 DIAGNOSIS — C481 Malignant neoplasm of specified parts of peritoneum: Secondary | ICD-10-CM | POA: Diagnosis not present

## 2023-11-01 LAB — CBC WITH DIFFERENTIAL (CANCER CENTER ONLY)
Abs Immature Granulocytes: 0.05 10*3/uL (ref 0.00–0.07)
Basophils Absolute: 0.1 10*3/uL (ref 0.0–0.1)
Basophils Relative: 0 %
Eosinophils Absolute: 0.3 10*3/uL (ref 0.0–0.5)
Eosinophils Relative: 2 %
HCT: 39.3 % (ref 39.0–52.0)
Hemoglobin: 13.6 g/dL (ref 13.0–17.0)
Immature Granulocytes: 0 %
Lymphocytes Relative: 32 %
Lymphs Abs: 4.3 10*3/uL — ABNORMAL HIGH (ref 0.7–4.0)
MCH: 31.3 pg (ref 26.0–34.0)
MCHC: 34.6 g/dL (ref 30.0–36.0)
MCV: 90.3 fL (ref 80.0–100.0)
Monocytes Absolute: 0.9 10*3/uL (ref 0.1–1.0)
Monocytes Relative: 6 %
Neutro Abs: 8.1 10*3/uL — ABNORMAL HIGH (ref 1.7–7.7)
Neutrophils Relative %: 60 %
Platelet Count: 274 10*3/uL (ref 150–400)
RBC: 4.35 MIL/uL (ref 4.22–5.81)
RDW: 14.1 % (ref 11.5–15.5)
WBC Count: 13.7 10*3/uL — ABNORMAL HIGH (ref 4.0–10.5)
nRBC: 0 % (ref 0.0–0.2)
nRBC: 0 /100{WBCs}

## 2023-11-01 LAB — CMP (CANCER CENTER ONLY)
ALT: 10 U/L (ref 0–44)
AST: 18 U/L (ref 15–41)
Albumin: 4.1 g/dL (ref 3.5–5.0)
Alkaline Phosphatase: 45 U/L (ref 38–126)
Anion gap: 11 (ref 5–15)
BUN: 15 mg/dL (ref 8–23)
CO2: 24 mmol/L (ref 22–32)
Calcium: 9.5 mg/dL (ref 8.9–10.3)
Chloride: 107 mmol/L (ref 98–111)
Creatinine: 1.04 mg/dL (ref 0.61–1.24)
GFR, Estimated: >60 mL/min (ref >60)
Glucose, Bld: 108 mg/dL — ABNORMAL HIGH (ref 70–99)
Potassium: 3.9 mmol/L (ref 3.5–5.1)
Sodium: 142 mmol/L (ref 135–145)
Total Bilirubin: 0.4 mg/dL (ref 0.0–1.2)
Total Protein: 6.9 g/dL (ref 6.5–8.1)

## 2023-11-01 NOTE — Telephone Encounter (Signed)
 Patient has been scheduled for follow-up visit per 11/01/23 LOS.  Pt given an appt calendar with date and time.

## 2023-11-14 ENCOUNTER — Other Ambulatory Visit: Payer: Self-pay | Admitting: Oncology

## 2023-11-18 ENCOUNTER — Emergency Department (HOSPITAL_COMMUNITY)

## 2023-11-18 ENCOUNTER — Other Ambulatory Visit: Payer: Self-pay

## 2023-11-18 ENCOUNTER — Inpatient Hospital Stay (HOSPITAL_COMMUNITY)
Admission: EM | Admit: 2023-11-18 | Discharge: 2023-11-21 | DRG: 281 | Disposition: A | Discharge status: Home / Self Care | Attending: Cardiology | Admitting: Cardiology

## 2023-11-18 ENCOUNTER — Encounter (HOSPITAL_COMMUNITY): Payer: Self-pay

## 2023-11-18 DIAGNOSIS — I10 Essential (primary) hypertension: Secondary | ICD-10-CM

## 2023-11-18 DIAGNOSIS — I252 Old myocardial infarction: Secondary | ICD-10-CM

## 2023-11-18 DIAGNOSIS — Q249 Congenital malformation of heart, unspecified: Secondary | ICD-10-CM

## 2023-11-18 DIAGNOSIS — Z79899 Other long term (current) drug therapy: Secondary | ICD-10-CM

## 2023-11-18 DIAGNOSIS — I4819 Other persistent atrial fibrillation: Secondary | ICD-10-CM

## 2023-11-18 DIAGNOSIS — E782 Mixed hyperlipidemia: Secondary | ICD-10-CM

## 2023-11-18 DIAGNOSIS — F17210 Nicotine dependence, cigarettes, uncomplicated: Secondary | ICD-10-CM | POA: Diagnosis present

## 2023-11-18 DIAGNOSIS — G893 Neoplasm related pain (acute) (chronic): Secondary | ICD-10-CM | POA: Diagnosis present

## 2023-11-18 DIAGNOSIS — I25111 Atherosclerotic heart disease of native coronary artery with angina pectoris with documented spasm: Secondary | ICD-10-CM

## 2023-11-18 DIAGNOSIS — K769 Liver disease, unspecified: Secondary | ICD-10-CM | POA: Diagnosis present

## 2023-11-18 DIAGNOSIS — Z7952 Long term (current) use of systemic steroids: Secondary | ICD-10-CM

## 2023-11-18 DIAGNOSIS — C49A4 Gastrointestinal stromal tumor of large intestine: Secondary | ICD-10-CM

## 2023-11-18 DIAGNOSIS — Z85831 Personal history of malignant neoplasm of soft tissue: Secondary | ICD-10-CM

## 2023-11-18 DIAGNOSIS — Z8774 Personal history of (corrected) congenital malformations of heart and circulatory system: Secondary | ICD-10-CM

## 2023-11-18 DIAGNOSIS — I214 Non-ST elevation (NSTEMI) myocardial infarction: Secondary | ICD-10-CM | POA: Diagnosis not present

## 2023-11-18 DIAGNOSIS — I4891 Unspecified atrial fibrillation: Secondary | ICD-10-CM

## 2023-11-18 DIAGNOSIS — Z9889 Other specified postprocedural states: Secondary | ICD-10-CM

## 2023-11-18 DIAGNOSIS — Z91041 Radiographic dye allergy status: Secondary | ICD-10-CM

## 2023-11-18 DIAGNOSIS — C49A0 Gastrointestinal stromal tumor, unspecified site: Secondary | ICD-10-CM | POA: Diagnosis present

## 2023-11-18 DIAGNOSIS — I2510 Atherosclerotic heart disease of native coronary artery without angina pectoris: Secondary | ICD-10-CM

## 2023-11-18 DIAGNOSIS — Z888 Allergy status to other drugs, medicaments and biological substances status: Secondary | ICD-10-CM

## 2023-11-18 DIAGNOSIS — R7303 Prediabetes: Secondary | ICD-10-CM | POA: Diagnosis present

## 2023-11-18 DIAGNOSIS — Z7951 Long term (current) use of inhaled steroids: Secondary | ICD-10-CM

## 2023-11-18 DIAGNOSIS — J4489 Other specified chronic obstructive pulmonary disease: Secondary | ICD-10-CM | POA: Diagnosis present

## 2023-11-18 LAB — BASIC METABOLIC PANEL WITH GFR
Anion gap: 9 (ref 5–15)
BUN: 16 mg/dL (ref 8–23)
CO2: 24 mmol/L (ref 22–32)
Calcium: 9.4 mg/dL (ref 8.9–10.3)
Chloride: 105 mmol/L (ref 98–111)
Creatinine, Ser: 1.3 mg/dL — ABNORMAL HIGH (ref 0.61–1.24)
GFR, Estimated: 59 mL/min — ABNORMAL LOW (ref >60)
Glucose, Bld: 97 mg/dL (ref 70–99)
Potassium: 3.9 mmol/L (ref 3.5–5.1)
Sodium: 138 mmol/L (ref 135–145)

## 2023-11-18 LAB — CBC
HCT: 41.9 % (ref 39.0–52.0)
Hemoglobin: 14.4 g/dL (ref 13.0–17.0)
MCH: 31.3 pg (ref 26.0–34.0)
MCHC: 34.4 g/dL (ref 30.0–36.0)
MCV: 91.1 fL (ref 80.0–100.0)
Platelets: 253 10*3/uL (ref 150–400)
RBC: 4.6 MIL/uL (ref 4.22–5.81)
RDW: 14 % (ref 11.5–15.5)
WBC: 9.6 10*3/uL (ref 4.0–10.5)
nRBC: 0 % (ref 0.0–0.2)

## 2023-11-18 LAB — TROPONIN I (HIGH SENSITIVITY)
Troponin I (High Sensitivity): 53 ng/L — ABNORMAL HIGH (ref <18)
Troponin I (High Sensitivity): 176 ng/L (ref <18)

## 2023-11-18 MED ORDER — HEPARIN (PORCINE) 25000 UT/250ML-% IV SOLN
1000.0000 [IU]/h | INTRAVENOUS | Status: DC
  Administered 2023-11-18 – 2023-11-21 (×3): 1000 [IU]/h via INTRAVENOUS
  Filled 2023-11-18 (×3): qty 250

## 2023-11-18 MED ORDER — HEPARIN BOLUS VIA INFUSION
4000.0000 [IU] | Freq: Once | INTRAVENOUS | Status: AC
  Administered 2023-11-18: 4000 [IU] via INTRAVENOUS
  Filled 2023-11-18: qty 4000

## 2023-11-18 MED ORDER — HYDROMORPHONE HCL 1 MG/ML IJ SOLN
1.0000 mg | Freq: Once | INTRAMUSCULAR | Status: AC
  Administered 2023-11-18: 1 mg via INTRAVENOUS
  Filled 2023-11-18: qty 1

## 2023-11-18 NOTE — ED Provider Triage Note (Signed)
 Emergency Medicine Provider Triage Evaluation Note  [facility] , a 73 y.o. male  was evaluated in triage.  Pt complains of chest pain, described as tightness/heaviness.  Patient states that today's episode started while he was fishing.  He had associated shortness of breath, nausea, diaphoresis.  He also had some discomfort over his arm.  Chest pain has now resolved.  He had gotten full dose of aspirin per paramedics.  He cannot tolerate nitro, it was not given.  He has had chest pain off and on over the last week.  Review of Systems  Positive: Chest pain, shortness of breath Negative: Dizziness, abdominal pain  Physical Exam  BP 121/85 (BP Location: Right Arm)   Pulse 93   Temp 97.8 F (36.6 C) (Oral)   Resp 16   Ht 6' (1.829 m)   Wt 79.4 kg   SpO2 95%   BMI 23.73 kg/m  Gen:   Awake, no distress   Resp:  Normal effort  MSK:   Moves extremities without difficulty  Other:   Medical Decision Making  Medically screening exam initiated at 7:42 PM.  Appropriate orders placed.  Peyton Najjar [facility] was informed that the remainder of the evaluation will be completed by another provider, this initial triage assessment does not replace that evaluation, and the importance of remaining in the ED until their evaluation is complete.  Patient with history of COPD, hypertension, A-fib not on any anticoagulation and just comes in with chief complaint of chest pain. Nonpleuritic chest pain. Currently chest pain-free.  Appropriate lab workup has been initiated.   Derwood Kaplan, MD 11/19/23 0002

## 2023-11-18 NOTE — ED Notes (Signed)
 Pt reporting extremepain dr Pilar Plate at  the bedside EKG number 2 iv dilaudid and heparin drip started

## 2023-11-18 NOTE — ED Provider Notes (Signed)
 [facility]-EM[facility] DEPT [facility] Emergency Department Provider Note MRN:  [id number]  Arrival date & time: 11/18/23     Chief Complaint   Chest Pain   History of Present Illness   Jacob Perez is a 73 y.o. year-old male with a history of metastatic gastrointestinal stromal tumor (GIST), A-fib presenting to the ED with chief complaint of chest pain.  Intermittent chest pain over the past week or two.  Severe today while fishing, made him move to leave.  Described as a pressure in the chest, central chest nonradiating.  Associated with nausea, lightheadedness, shortness of breath.  Pain has since resolved after taking aspirin at home.  Review of Systems  A thorough review of systems was obtained and all systems are negative except as noted in the HPI and PMH.   Patient's Health History    Past Medical History:  Diagnosis Date   Arthritis    Asthma    COPD (chronic obstructive pulmonary disease) (HCC)    Hypertension    Irregular heart beat     History reviewed. No pertinent surgical history.  History reviewed. No pertinent family history.  Social History   Socioeconomic History   Marital status: Married    Spouse name: Not on file   Number of children: Not on file   Years of education: Not on file   Highest education level: Not on file  Occupational History   Not on file  Tobacco Use   Smoking status: Every Day   Smokeless tobacco: Not on file  Substance and Sexual Activity   Alcohol use: No   Drug use: Not on file   Sexual activity: Not on file  Other Topics Concern   Not on file  Social History Narrative   Not on file   Social Drivers of Health   Financial Resource Strain: Not on file  Food Insecurity: Not on file  Transportation Needs: Not on file  Physical Activity: Unknown (06/27/2018)   Received from [facility], [facility]   Exercise Vital Sign    Days of Exercise per Week: 7 days    Minutes of Exercise per Session: Not on file   Stress: No Stress Concern Present (06/27/2018)   Received from [facility], [facility] of Occupational Health - Occupational Stress Questionnaire    Feeling of Stress : Only a little  Social Connections: Somewhat Isolated (06/27/2018)   Received from [facility], [facility]   Social Connection and Isolation Panel [NHANES]    Frequency of Communication with Friends and Family: Once a week    Frequency of Social Gatherings with Friends and Family: More than three times a week    Attends Religious Services: Never    Database administrator or Organizations: No    Attends Banker Meetings: Never    Marital Status: Married  Catering manager Violence: Not on file     Physical Exam   Vitals:   11/18/23 1928 11/18/23 2255  BP: 121/85 121/82  Pulse: 93 91  Resp: 16 16  Temp: 97.8 F (36.6 C) 97.8 F (36.6 C)  SpO2: 95% 99%    CONSTITUTIONAL: Well-appearing, NAD NEURO/PSYCH:  Alert and oriented x 3, no focal deficits EYES:  eyes equal and reactive ENT/NECK:  no LAD, no JVD CARDIO: Regular rate, well-perfused, normal S1 and S2 PULM:  CTAB no wheezing or rhonchi GI/GU:  non-distended, non-tender MSK/SPINE:  No gross deformities, no edema SKIN:  no rash, atraumatic   *Additional and/or pertinent findings included in MDM below  Diagnostic and Interventional Summary    EKG Interpretation Date/Time:  November 18, 2023 at 23:28:36 Ventricular Rate:   88 PR Interval:   149 QRS Duration:   112 QT Interval:   372 QTC Calculation:  451 R Axis:      Text Interpretation: Sinus rhythm, nonspecific intraventricular conduction delay, ST depression V4 through V6, overall similar to prior on record       Labs Reviewed  BASIC METABOLIC PANEL WITH GFR - Abnormal; Notable for the following components:      Result Value   Creatinine, Ser 1.30 (*)    GFR, Estimated 59 (*)    All other components within normal limits  TROPONIN I (HIGH  SENSITIVITY) - Abnormal; Notable for the following components:   Troponin I (High Sensitivity) 53 (*)    All other components within normal limits  TROPONIN I (HIGH SENSITIVITY) - Abnormal; Notable for the following components:   Troponin I (High Sensitivity) 176 (*)    All other components within normal limits  CBC    DG Chest 2 View  Final Result      Medications  heparin bolus via infusion 4,000 Units (has no administration in time range)  heparin ADULT infusion 100 units/mL (25000 units/269mL) (has no administration in time range)  HYDROmorphone (DILAUDID) injection 1 mg (1 mg Intravenous Given 11/18/23 2336)     Procedures  /  Critical Care .Critical Care  Performed by: Sabas Sous, MD Authorized by: Sabas Sous, MD   Critical care provider statement:    Critical care time (minutes):  45   Critical care was necessary to treat or prevent imminent or life-threatening deterioration of the following conditions: NSTEMI.   Critical care was time spent personally by me on the following activities:  Development of treatment plan with patient or surrogate, discussions with consultants, evaluation of patient's response to treatment, examination of patient, ordering and review of laboratory studies, ordering and review of radiographic studies, ordering and performing treatments and interventions, pulse oximetry, re-evaluation of patient's condition and review of old charts   ED Course and Medical Decision Making  Initial Impression and Ddx Typical severe chest pain intermittently over the past week or so.  History of GIST cancer on monoclonal antibody therapy.  Tobacco use history.  Suspicion for ACS.  Pain-free on my initial assessment in no acute distress with normal vital signs.  Highly doubt PE given the intermittent nature, lack of tachycardia, no hypoxia.  Initial EKG showing somewhat concerning findings, possibly elevation in aVL and depression in aVF new from prior, will  repeat.  Past medical/surgical history that increases complexity of ED encounter: GIST cancer, A-fib  Interpretation of Diagnostics I personally reviewed the EKG and my interpretation is as follows: Sinus rhythm, ST segment findings raising concern for ischemia  No significant blood count or electrolyte disturbance.  Minimal creatinine elevation.  Troponin elevated and rising upon repeat up to 176.  Patient Reassessment and Ultimate Disposition/Management     Concern for type I NSTEMI.  Consulting cardiology, starting heparin.  11:30 PM update: Patient's pain has returned, described as severe, same pain that has been happening intermittently.  Repeat EKG does not show STEMI, overall improved from his initial EKG in this emergency department.  Providing Dilaudid for pain.  Patient declines nitroglycerin tablet, says it lowers his blood pressure.  Awaiting cardiology recommendations.  ZOLL at bedside.  Patient management required  discussion with the following services or consulting groups:  Cardiology  Complexity of Problems Addressed Acute illness or injury that poses threat of life of bodily function  Additional Data Reviewed and Analyzed Further history obtained from: Further history from spouse/family member  Additional Factors Impacting ED Encounter Risk Use of parenteral controlled substances and Consideration of hospitalization  Elmer Sow. Pilar Plate, MD [facility] Emergency Medicine [facility] [email or web address]  Final Clinical Impressions(s) / ED Diagnoses     ICD-10-CM   1. NSTEMI (non-ST elevated myocardial infarction) [name])  I21.4       ED Discharge Orders     None        Discharge Instructions Discussed with and Provided to Patient:   Discharge Instructions   None      Sabas Sous, MD 11/18/23 2338

## 2023-11-18 NOTE — ED Triage Notes (Signed)
 PER EMS: pt is from home with c/o intermittent chest pain x 1 week associated with shortness of breath. Each time it has progressively gotten worse. He reports the CP resolves with aspirin, which he took 1800 today.   134/74, HR 98, 95% RA 18g LAC

## 2023-11-18 NOTE — ED Notes (Signed)
 Dr. Rhunette Croft at bedside for [facility].

## 2023-11-19 ENCOUNTER — Inpatient Hospital Stay (HOSPITAL_COMMUNITY)

## 2023-11-19 ENCOUNTER — Other Ambulatory Visit: Payer: Self-pay

## 2023-11-19 DIAGNOSIS — Z85831 Personal history of malignant neoplasm of soft tissue: Secondary | ICD-10-CM | POA: Diagnosis not present

## 2023-11-19 DIAGNOSIS — G893 Neoplasm related pain (acute) (chronic): Secondary | ICD-10-CM | POA: Diagnosis present

## 2023-11-19 DIAGNOSIS — I214 Non-ST elevation (NSTEMI) myocardial infarction: Secondary | ICD-10-CM | POA: Diagnosis present

## 2023-11-19 DIAGNOSIS — Z888 Allergy status to other drugs, medicaments and biological substances status: Secondary | ICD-10-CM | POA: Diagnosis not present

## 2023-11-19 DIAGNOSIS — J4489 Other specified chronic obstructive pulmonary disease: Secondary | ICD-10-CM | POA: Diagnosis present

## 2023-11-19 DIAGNOSIS — Z91041 Radiographic dye allergy status: Secondary | ICD-10-CM | POA: Diagnosis not present

## 2023-11-19 DIAGNOSIS — K769 Liver disease, unspecified: Secondary | ICD-10-CM | POA: Diagnosis present

## 2023-11-19 DIAGNOSIS — I2510 Atherosclerotic heart disease of native coronary artery without angina pectoris: Secondary | ICD-10-CM | POA: Diagnosis present

## 2023-11-19 DIAGNOSIS — Q249 Congenital malformation of heart, unspecified: Secondary | ICD-10-CM | POA: Diagnosis not present

## 2023-11-19 DIAGNOSIS — F17210 Nicotine dependence, cigarettes, uncomplicated: Secondary | ICD-10-CM | POA: Diagnosis present

## 2023-11-19 DIAGNOSIS — I10 Essential (primary) hypertension: Secondary | ICD-10-CM | POA: Diagnosis present

## 2023-11-19 DIAGNOSIS — E782 Mixed hyperlipidemia: Secondary | ICD-10-CM | POA: Diagnosis present

## 2023-11-19 DIAGNOSIS — R7303 Prediabetes: Secondary | ICD-10-CM | POA: Diagnosis present

## 2023-11-19 DIAGNOSIS — C49A0 Gastrointestinal stromal tumor, unspecified site: Secondary | ICD-10-CM | POA: Diagnosis present

## 2023-11-19 DIAGNOSIS — I252 Old myocardial infarction: Secondary | ICD-10-CM | POA: Diagnosis not present

## 2023-11-19 DIAGNOSIS — Z8774 Personal history of (corrected) congenital malformations of heart and circulatory system: Secondary | ICD-10-CM | POA: Diagnosis not present

## 2023-11-19 DIAGNOSIS — Z7951 Long term (current) use of inhaled steroids: Secondary | ICD-10-CM | POA: Diagnosis not present

## 2023-11-19 DIAGNOSIS — Z79899 Other long term (current) drug therapy: Secondary | ICD-10-CM | POA: Diagnosis not present

## 2023-11-19 DIAGNOSIS — I2489 Other forms of acute ischemic heart disease: Secondary | ICD-10-CM | POA: Diagnosis not present

## 2023-11-19 DIAGNOSIS — I4819 Other persistent atrial fibrillation: Secondary | ICD-10-CM | POA: Diagnosis present

## 2023-11-19 DIAGNOSIS — I25111 Atherosclerotic heart disease of native coronary artery with angina pectoris with documented spasm: Secondary | ICD-10-CM | POA: Diagnosis not present

## 2023-11-19 DIAGNOSIS — Z7952 Long term (current) use of systemic steroids: Secondary | ICD-10-CM | POA: Diagnosis not present

## 2023-11-19 LAB — CBC
HCT: 42.3 % (ref 39.0–52.0)
Hemoglobin: 14.5 g/dL (ref 13.0–17.0)
MCH: 31.3 pg (ref 26.0–34.0)
MCHC: 34.3 g/dL (ref 30.0–36.0)
MCV: 91.4 fL (ref 80.0–100.0)
Platelets: 239 10*3/uL (ref 150–400)
RBC: 4.63 MIL/uL (ref 4.22–5.81)
RDW: 14 % (ref 11.5–15.5)
WBC: 8.2 10*3/uL (ref 4.0–10.5)
nRBC: 0 % (ref 0.0–0.2)

## 2023-11-19 LAB — LIPID PANEL
Cholesterol: 197 mg/dL (ref 0–200)
HDL: 34 mg/dL — ABNORMAL LOW (ref >40)
LDL Cholesterol: 142 mg/dL — ABNORMAL HIGH (ref 0–99)
Total CHOL/HDL Ratio: 5.8 ratio
Triglycerides: 104 mg/dL (ref <150)
VLDL: 21 mg/dL (ref 0–40)

## 2023-11-19 LAB — TROPONIN I (HIGH SENSITIVITY): Troponin I (High Sensitivity): 59 ng/L — ABNORMAL HIGH (ref <18)

## 2023-11-19 LAB — BASIC METABOLIC PANEL WITH GFR
Anion gap: 9 (ref 5–15)
BUN: 16 mg/dL (ref 8–23)
CO2: 22 mmol/L (ref 22–32)
Calcium: 9.3 mg/dL (ref 8.9–10.3)
Chloride: 106 mmol/L (ref 98–111)
Creatinine, Ser: 1.11 mg/dL (ref 0.61–1.24)
GFR, Estimated: >60 mL/min (ref >60)
Glucose, Bld: 95 mg/dL (ref 70–99)
Potassium: 4.4 mmol/L (ref 3.5–5.1)
Sodium: 137 mmol/L (ref 135–145)

## 2023-11-19 LAB — T4, FREE: Free T4: 1.03 ng/dL (ref 0.61–1.12)

## 2023-11-19 LAB — HEPARIN LEVEL (UNFRACTIONATED): Heparin Unfractionated: 0.4 [IU]/mL (ref 0.30–0.70)

## 2023-11-19 LAB — PROTIME-INR
INR: 1 (ref 0.8–1.2)
Prothrombin Time: 13.4 s (ref 11.4–15.2)

## 2023-11-19 LAB — TSH: TSH: 3.314 u[IU]/mL (ref 0.350–4.500)

## 2023-11-19 LAB — MRSA NEXT GEN BY PCR, NASAL: MRSA by PCR Next Gen: NOT DETECTED

## 2023-11-19 MED ORDER — NITROGLYCERIN IN D5W 200-5 MCG/ML-% IV SOLN: 0.0000 ug/min | INTRAVENOUS | Status: DC

## 2023-11-19 MED ORDER — HYDROMORPHONE HCL 1 MG/ML IJ SOLN
1.0000 mg | Freq: Once | INTRAMUSCULAR | Status: AC
  Administered 2023-11-19: 1 mg via INTRAVENOUS
  Filled 2023-11-19: qty 1

## 2023-11-19 MED ORDER — DIPHENHYDRAMINE HCL 50 MG/ML IJ SOLN: 50.0000 mg | Freq: Once | INTRAMUSCULAR | Status: AC

## 2023-11-19 MED ORDER — ACETAMINOPHEN 325 MG PO TABS
650.0000 mg | ORAL_TABLET | ORAL | Status: DC | PRN
  Administered 2023-11-21 (×2): 650 mg via ORAL
  Filled 2023-11-19 (×2): qty 2

## 2023-11-19 MED ORDER — LACTATED RINGERS IV BOLUS
500.0000 mL | Freq: Once | INTRAVENOUS | Status: AC
  Administered 2023-11-19: 500 mL via INTRAVENOUS

## 2023-11-19 MED ORDER — PREDNISONE 20 MG PO TABS
50.0000 mg | ORAL_TABLET | Freq: Four times a day (QID) | ORAL | Status: AC
  Administered 2023-11-19 – 2023-11-20 (×3): 50 mg via ORAL
  Filled 2023-11-19 (×3): qty 1

## 2023-11-19 MED ORDER — WHITE PETROLATUM EX OINT
TOPICAL_OINTMENT | CUTANEOUS | Status: DC | PRN
  Administered 2023-11-19: 0.2 via TOPICAL
  Filled 2023-11-19: qty 28.35

## 2023-11-19 MED ORDER — ONDANSETRON HCL 4 MG/2ML IJ SOLN
4.0000 mg | Freq: Four times a day (QID) | INTRAMUSCULAR | Status: DC | PRN
  Administered 2023-11-21: 4 mg via INTRAVENOUS
  Filled 2023-11-19: qty 2

## 2023-11-19 MED ORDER — MOMETASONE FURO-FORMOTEROL FUM 200-5 MCG/ACT IN AERO
2.0000 | INHALATION_SPRAY | Freq: Two times a day (BID) | RESPIRATORY_TRACT | Status: DC
  Administered 2023-11-19 – 2023-11-21 (×3): 2 via RESPIRATORY_TRACT
  Filled 2023-11-19: qty 8.8

## 2023-11-19 MED ORDER — GABAPENTIN 300 MG PO CAPS: 300.0000 mg | ORAL_CAPSULE | Freq: Every day | ORAL | Status: DC | PRN

## 2023-11-19 MED ORDER — METOPROLOL SUCCINATE ER 25 MG PO TB24
25.0000 mg | ORAL_TABLET | Freq: Every day | ORAL | Status: DC
  Administered 2023-11-19 – 2023-11-21 (×3): 25 mg via ORAL
  Filled 2023-11-19 (×3): qty 1

## 2023-11-19 MED ORDER — PREDNISONE 20 MG PO TABS
20.0000 mg | ORAL_TABLET | Freq: Every day | ORAL | Status: DC
  Administered 2023-11-19 – 2023-11-21 (×3): 20 mg via ORAL
  Filled 2023-11-19 (×3): qty 1

## 2023-11-19 MED ORDER — DIPHENHYDRAMINE HCL 25 MG PO CAPS
50.0000 mg | ORAL_CAPSULE | Freq: Once | ORAL | Status: AC
  Administered 2023-11-20: 50 mg via ORAL
  Filled 2023-11-19: qty 2

## 2023-11-19 MED ORDER — ALBUTEROL SULFATE HFA 108 (90 BASE) MCG/ACT IN AERS: 2.0000 | INHALATION_SPRAY | RESPIRATORY_TRACT | Status: DC | PRN

## 2023-11-19 MED ORDER — ALBUTEROL SULFATE (2.5 MG/3ML) 0.083% IN NEBU: 2.5000 mg | INHALATION_SOLUTION | Freq: Four times a day (QID) | RESPIRATORY_TRACT | Status: DC | PRN

## 2023-11-19 MED ORDER — CLOPIDOGREL BISULFATE 300 MG PO TABS
600.0000 mg | ORAL_TABLET | Freq: Once | ORAL | Status: AC
  Administered 2023-11-19: 600 mg via ORAL
  Filled 2023-11-19: qty 2

## 2023-11-19 MED ORDER — CLOPIDOGREL BISULFATE 75 MG PO TABS
75.0000 mg | ORAL_TABLET | Freq: Every day | ORAL | Status: DC
  Administered 2023-11-20 – 2023-11-21 (×2): 75 mg via ORAL
  Filled 2023-11-19 (×2): qty 1

## 2023-11-19 MED ORDER — REGORAFENIB 40 MG PO TABS
80.0000 mg | ORAL_TABLET | Freq: Every day | ORAL | Status: DC
  Administered 2023-11-19: 80 mg via ORAL

## 2023-11-19 MED ORDER — ROSUVASTATIN CALCIUM 20 MG PO TABS
20.0000 mg | ORAL_TABLET | Freq: Every day | ORAL | Status: DC
  Administered 2023-11-19 – 2023-11-20 (×2): 20 mg via ORAL
  Filled 2023-11-19 (×2): qty 1

## 2023-11-19 MED ORDER — NITROGLYCERIN IN D5W 200-5 MCG/ML-% IV SOLN
0.0000 ug/min | INTRAVENOUS | Status: DC
  Administered 2023-11-19: 5 ug/min via INTRAVENOUS
  Filled 2023-11-19: qty 250

## 2023-11-19 MED ORDER — ROSUVASTATIN CALCIUM 5 MG PO TABS: 10.0000 mg | ORAL_TABLET | Freq: Every day | ORAL | Status: DC

## 2023-11-19 MED ORDER — ASPIRIN 81 MG PO TBEC
81.0000 mg | DELAYED_RELEASE_TABLET | Freq: Every day | ORAL | Status: DC
  Administered 2023-11-19 – 2023-11-20 (×2): 81 mg via ORAL
  Filled 2023-11-19 (×2): qty 1

## 2023-11-19 NOTE — H&P (Signed)
 Cardiology Admission History and Physical   Patient ID: Jacob Perez MRN: [id number]; DOB: November 23, 1950   Admission date: 11/18/2023  PCP:  Lonie Peak, PA-C   [facility] Providers Cardiologist:  None        Chief Complaint:  chest pain  Patient Profile:   Jacob Perez is a 73 y.o. 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  Notes that he has been having on and off chest pain for the last several weeks.  Was doing okay today and went with his brother fishing and had to leave early due to severe chest pain.  Chest pain has been progressive and waxing and waning.  Initially en route to the emergency department he took full dose aspirin and had some relief.  However chest pain recurs while in the emergency department.  Troponins went from 50s to 170s.  He was started on nitroglycerin infusion and eventually chest pain subsided.  All other labs on admission were largely unremarkable.  Of note he has been treated actively for metastatic GI stromal tumor.  Per his report and recent oncology notes this has been stable.  He also has COPD and is an active smoker.  Smokes about 1 pack/day.   Past Medical History:  Diagnosis Date   Arthritis    Asthma    COPD (chronic obstructive pulmonary disease) (HCC)    Hypertension    Irregular heart beat     History reviewed. No pertinent surgical history.   Medications Prior to Admission: Prior to Admission medications   Medication Sig Start Date End Date Taking? Authorizing Provider  albuterol (PROVENTIL) (2.5 MG/3ML) 0.083% nebulizer solution Take 2.5 mg by nebulization every 6 (six) hours as needed for wheezing or  shortness of breath. 05/20/23  Yes [provider]  albuterol (VENTOLIN HFA) 108 (90 Base) MCG/ACT inhaler Inhale 2 puffs into the lungs every 4 (four) hours as needed for wheezing or shortness of breath. 06/18/22  Yes [provider]  gabapentin (NEURONTIN) 300 MG capsule Take 300 mg by mouth daily as needed (for breakthrough pain). 04/14/23  Yes [provider]  Oxycodone HCl 10 MG TABS Take 1 tablet (10 mg total) by mouth every 4 (four) hours as needed. 1 tab po q6h prn 09/06/23  Yes Lewis, Dequincy A, MD  predniSONE (DELTASONE) 20 MG tablet Take 1 tablet (20 mg total) by mouth daily with breakfast. TAKE FOR APPETITE STIMULATION 09/06/23  Yes Lewis, Dequincy A, MD  regorafenib (STIVARGA) 40 MG tablet Take 2 tablets (80 mg total) by mouth daily with breakfast. Take with low fat meal. For 21 days, then 7 days off, every 28 days. Caution: Chemotherapy. 09/27/23  Yes Lewis, Dequincy A, MD  rosuvastatin (CRESTOR) 10 MG tablet Take 10 mg by mouth at bedtime. 10/18/23  Yes [provider]  sulfamethoxazole-trimethoprim (BACTRIM DS) 800-160 MG tablet Take 1 tablet by mouth 2 (two) times daily. 11/14/23  Yes [provider]  SYMBICORT 160-4.5 MCG/ACT inhaler SMARTSIG:2 Puff(s) By Mouth Twice Daily 02/06/22  Yes [provider]  Vitamin D-Vitamin K (VITAMIN D2 + K1 PO) Take 2 tablets by mouth daily.   Yes [provider]  amLODipine (  NORVASC) 10 MG tablet Take 1 tablet (10 mg total) by mouth daily. Patient not taking: Reported on 11/18/2023 06/12/22 06/12/23  Arnaldo Natal, MD  augmented betamethasone dipropionate (DIPROLENE-AF) 0.05 % cream Apply 1 Application topically daily as needed (for psoriasis). Patient not taking: Reported on 11/18/2023 09/03/22   [provider]  megestrol (MEGACE) 400 MG/10ML suspension Take 20 mLs (800 mg total) by mouth daily. Patient not taking: Reported on 11/18/2023 03/09/23   Rennis Harding A, MD  metoprolol succinate  (TOPROL-XL) 25 MG 24 hr tablet Take 1 tablet by mouth daily. Patient not taking: Reported on 11/18/2023 03/09/22 11/18/23  [provider]     Allergies:    Allergies  Allergen Reactions   Ace Inhibitors     Cough    Contrast Media [Iodinated Contrast Media] Itching and Nausea And Vomiting   Ioxaglate Itching and Nausea And Vomiting   Metrizamide Itching and Nausea And Vomiting    Social History:   Social History   Socioeconomic History   Marital status: Married    Spouse name: Not on file   Number of children: Not on file   Years of education: Not on file   Highest education level: Not on file  Occupational History   Not on file  Tobacco Use   Smoking status: Every Day   Smokeless tobacco: Not on file  Substance and Sexual Activity   Alcohol use: No   Drug use: Not on file   Sexual activity: Not on file  Other Topics Concern   Not on file  Social History Narrative   Not on file   Social Drivers of Health   Financial Resource Strain: Not on file  Food Insecurity: Not on file  Transportation Needs: Not on file  Physical Activity: Unknown (06/27/2018)   Received from [facility], [facility]   Exercise Vital Sign    Days of Exercise per Week: 7 days    Minutes of Exercise per Session: Not on file  Stress: No Stress Concern Present (06/27/2018)   Received from [facility], [facility] of Occupational Health - Occupational Stress Questionnaire    Feeling of Stress : Only a little  Social Connections: Somewhat Isolated (06/27/2018)   Received from [facility], [facility]   Social Connection and Isolation Panel [NHANES]    Frequency of Communication with Friends and Family: Once a week    Frequency of Social Gatherings with Friends and Family: Perez than three times a week    Attends Religious Services: Never    Database administrator or Organizations: No    Attends Banker Meetings: Never    Marital  Status: Married  Catering manager Violence: Not on file    Family History:   The patient's family history is not on file.    ROS:  Please see the history of present illness.  All other ROS reviewed and negative.     Physical Exam/Data:   Vitals:   11/18/23 2300 11/18/23 2345 11/19/23 0000 11/19/23 0030  BP: 113/78 135/87 127/88 (!) 175/109  Pulse: 88 82 86 90  Resp: 18 14 (!) 24 (!) 9  Temp:      TempSrc:      SpO2: 97% 100% 100% 100%  Weight:      Height:       No intake or output data in the 24 hours ending 11/19/23 0343    11/18/2023  7:20 PM 11/01/2023    9:57 AM 09/06/2023    9:21 AM  Last 3 Weights  Weight (lbs) 175 lb 175 lb 9.6 oz 177 lb 8 oz  Weight (kg) 79.379 kg 79.652 kg 80.513 kg     Body mass index is 23.73 kg/m.  General:   in no acute distress HEENT: normal Neck: no JVD Vascular: No carotid bruits; Distal pulses 2+ bilaterally   Cardiac:  normal S1, S2; RRR; no murmur  Lungs:  clear to auscultation bilaterally, no wheezing, rhonchi or rales  Abd: soft, nontender, no hepatomegaly  Ext: no edema Musculoskeletal:  No deformities, BUE and BLE strength normal and equal Skin: warm and dry  Neuro:  CNs 2-12 intact, no focal abnormalities noted Psych:  Normal affect    EKG:  The ECG that was done  was personally reviewed and demonstrates no acute ST or T wave changes  Relevant CV Studies: None  Laboratory Data:  High Sensitivity Troponin:   Recent Labs  Lab 11/18/23 1928 11/18/23 2220  TROPONINIHS 53* 176*      Chemistry Recent Labs  Lab 11/18/23 1928  NA 138  K 3.9  CL 105  CO2 24  GLUCOSE 97  BUN 16  CREATININE 1.30*  CALCIUM 9.4  GFRNONAA 59*  ANIONGAP 9    No results for input(s): "PROT", "ALBUMIN", "AST", "ALT", "ALKPHOS", "BILITOT" in the last 168 hours. Lipids No results for input(s): "CHOL", "TRIG", "HDL", "LABVLDL", "LDLCALC", "CHOLHDL" in the last 168 hours. Hematology Recent Labs  Lab 11/18/23 1928  WBC 9.6  RBC  4.60  HGB 14.4  HCT 41.9  MCV 91.1  MCH 31.3  MCHC 34.4  RDW 14.0  PLT 253   Thyroid No results for input(s): "TSH", "FREET4" in the last 168 hours. BNPNo results for input(s): "BNP", "PROBNP" in the last 168 hours.  DDimer No results for input(s): "DDIMER" in the last 168 hours.   Radiology/Studies:  DG Chest 2 View Result Date: 11/18/2023 CLINICAL DATA:  Chest pain for 1 month, nausea, history of gastric cancer EXAM: CHEST - 2 VIEW COMPARISON:  12/03/2022 FINDINGS: Frontal and lateral views of the chest demonstrate postsurgical changes from median sternotomy. The cardiac silhouette is unremarkable. Stable background scarring and emphysema, with no evidence of acute airspace disease, effusion, or pneumothorax. The left lower lobe pulmonary nodule overlying the left anterior seventh rib does not appear significantly changed since prior exam. No acute bony abnormalities. IMPRESSION: 1. Stable left lower lobe pulmonary nodule. 2. Emphysema.  No acute airspace disease. Electronically Signed   By: Sharlet Salina M.D.   On: 11/18/2023 20:38     Assessment and Plan:   NSTEMI.  Symptoms are very worrisome for type I MI.  Has had progressive angina over the last several weeks and today has had severe pain that she will ultimately to nitroglycerin infusion.  Given that he has stable management of his metastatic cancer, seems reasonable to take him to the cardiac Cath Lab for further evaluation.  He will also need an echo for functional assessment. - npo after midnight for consideration of cath given ongoing pain until nitroglycerin infusion - echo ordered for tomorrow - heparin for anticoagulation - loaded 325 mg ASA; continue 81 mg daily  -  will load with clopidogrel; continue 75 mg daily  - Continue rosuvastatin 10 mg; check lipid panel and LP(a) - Consider beta-blocker and/or ACE inhibitor after catheterization - will check CBC, CMP, INR, hemoglobin A1c, tsh/FT4 - referral for cardiac  rehab - admit for cardiac tele; strict I&Os; daily weights  - Continue nitroglycerin infusion for pain for now  GIST -Continue regorafenib  COPD - continue home inhalers   HLD - continue rosuvastatin 10 mg    Risk Assessment/Risk Scores:    TIMI Risk Score for Unstable Angina or Non-ST Elevation MI:   The patient's TIMI risk score is 5, which indicates a 26% risk of all cause mortality, new or recurrent myocardial infarction or need for urgent revascularization in the next 14 days.   Code Status: Full Code  Severity of Illness: The appropriate patient status for this patient is INPATIENT. Inpatient status is judged to be reasonable and necessary in order to provide the required intensity of service to ensure the patient's safety. The patient's presenting symptoms, physical exam findings, and initial radiographic and laboratory data in the context of their chronic comorbidities is felt to place them at high risk for further clinical deterioration. Furthermore, it is not anticipated that the patient will be medically stable for discharge from the hospital within 2 midnights of admission.   * I certify that at the point of admission it is my clinical judgment that the patient will require inpatient hospital care spanning beyond 2 midnights from the point of admission due to high intensity of service, high risk for further deterioration and high frequency of surveillance required.*   For questions or updates, please contact [facility] Please consult [email or web address] for contact info under     Signed, Joellen Jersey, MD  11/19/2023 3:43 AM

## 2023-11-19 NOTE — Plan of Care (Signed)

## 2023-11-19 NOTE — ED Notes (Signed)
 The pts pain is gone  the the nitroglicerine drip remains at 79mcg/min

## 2023-11-19 NOTE — Progress Notes (Signed)
 PHARMACY - ANTICOAGULATION CONSULT NOTE  Pharmacy Consult for Heparin  Indication: chest pain/ACS  Allergies  Allergen Reactions   Ace Inhibitors     Cough    Contrast Media [Iodinated Contrast Media] Itching and Nausea And Vomiting   Ioxaglate Itching and Nausea And Vomiting   Metrizamide Itching and Nausea And Vomiting    Patient Measurements: Height: 6' (182.9 cm) Weight: 79.4 kg (175 lb) IBW/kg (Calculated) : 77.6 HEPARIN DW (KG): 79.4  Vital Signs: Temp: 97.5 F (36.4 C) (04/05 0756) Temp Source: Oral (04/05 0756) BP: 119/90 (04/05 0900) Pulse Rate: 88 (04/05 0900)  Labs: Recent Labs    11/18/23 1928 11/18/23 2220 11/19/23 0330 11/19/23 0350 11/19/23 0759  HGB 14.4  --   --  14.5  --   HCT 41.9  --   --  42.3  --   PLT 253  --   --  239  --   LABPROT  --   --   --  13.4  --   INR  --   --   --  1.0  --   HEPARINUNFRC  --   --   --   --  0.40  CREATININE 1.30*  --  1.11  --   --   TROPONINIHS 53* 176* 59*  --   --     Estimated Creatinine Clearance: 68 mL/min (by C-G formula based on SCr of 1.11 mg/dL).   Medical History: Past Medical History:  Diagnosis Date   Arthritis    Asthma    COPD (chronic obstructive pulmonary disease) (HCC)    Hypertension    Irregular heart beat      Assessment: 73 y/o M with chest pain and elevated troponin. Starting heparin. Above labs reviewed. PTA meds reviewed.   Initial heparin level therapeutic on 1000 units/hr  Goal of Therapy:  Heparin level 0.3-0.7 units/ml Monitor platelets by anticoagulation protocol: Yes   Plan:  Continue heparin gtt at 1000 units/hr F/u cards plan with cath  Daily heparin level, CBC, s/s bleeding  Daylene Posey, PharmD, [facility] Clinical Pharmacist ED Pharmacist Phone # [phone] 11/19/2023 9:46 AM

## 2023-11-19 NOTE — Progress Notes (Signed)
 PHARMACY - ANTICOAGULATION CONSULT NOTE  Pharmacy Consult for Heparin  Indication: chest pain/ACS  Allergies  Allergen Reactions   Ace Inhibitors     Cough    Contrast Media [Iodinated Contrast Media] Itching and Nausea And Vomiting   Ioxaglate Itching and Nausea And Vomiting   Metrizamide Itching and Nausea And Vomiting    Patient Measurements: Height: 6' (182.9 cm) Weight: 79.4 kg (175 lb) IBW/kg (Calculated) : 77.6 HEPARIN DW (KG): 79.4  Vital Signs: Temp: 97.8 F (36.6 C) (04/04 2255) Temp Source: Oral (04/04 1928) BP: 175/109 (04/05 0030) Pulse Rate: 90 (04/05 0030)  Labs: Recent Labs    11/18/23 1928 11/18/23 2220  HGB 14.4  --   HCT 41.9  --   PLT 253  --   CREATININE 1.30*  --   TROPONINIHS 53* 176*    Estimated Creatinine Clearance: 58 mL/min (A) (by C-G formula based on SCr of 1.3 mg/dL (H)).   Medical History: Past Medical History:  Diagnosis Date   Arthritis    Asthma    COPD (chronic obstructive pulmonary disease) (HCC)    Hypertension    Irregular heart beat      Assessment: 73 y/o M with chest pain and elevated troponin. Starting heparin. Above labs reviewed. PTA meds reviewed.   Goal of Therapy:  Heparin level 0.3-0.7 units/ml Monitor platelets by anticoagulation protocol: Yes   Plan:  Heparin 4000 units BOLUS Start heparin drip at 1000 units/hr Heparin level in 8 hours Daily CBC/Heparin level Monitor for bleeding  Abran Duke, PharmD, BCPS Clinical Pharmacist Phone: [phone]

## 2023-11-19 NOTE — Progress Notes (Signed)
 Dr. Tenny Craw requested CT Chest W Contrast be ordered with non-emergent premedication protocol. Discussed prednisone dosing, she requests the pre-med protocol in addition to his chronic daily prednisone dosing, procedure not felt to be emergent.

## 2023-11-19 NOTE — Progress Notes (Addendum)
 Rounding Note    Patient Name: Jacob Perez Date of Encounter: 11/19/2023  [facility] Cardiologist: New   Subjective   NO CP   No SOB   Inpatient Medications    Scheduled Meds:  aspirin EC  81 mg Oral Daily   [START ON 11/20/2023] clopidogrel  75 mg Oral Daily   mometasone-formoterol  2 puff Inhalation BID   predniSONE  20 mg Oral Q breakfast   regorafenib  80 mg Oral Q breakfast   rosuvastatin  10 mg Oral QHS   Continuous Infusions:  heparin 1,000 Units/hr (11/19/23 1149)   nitroGLYCERIN 5 mcg/min (11/19/23 0757)   PRN Meds: acetaminophen, albuterol, gabapentin, ondansetron (ZOFRAN) IV   Vital Signs    Vitals:   11/19/23 1100 11/19/23 1130 11/19/23 1152 11/19/23 1335  BP: 134/80 (!) 144/85  133/89  Pulse: 91 88    Resp: 19 15    Temp:   97.6 F (36.4 C) (!) 97.5 F (36.4 C)  TempSrc:   Oral Oral  SpO2: 96% 97%  93%  Weight:    77.7 kg  Height:        Intake/Output Summary (Last 24 hours) at 11/19/2023 1359 Last data filed at 11/19/2023 1149 Gross per 24 hour  Intake 160.69 ml  Output --  Net 160.69 ml      11/19/2023    1:35 PM 11/18/2023    7:20 PM 11/01/2023    9:57 AM  Last 3 Weights  Weight (lbs) 171 lb 4.8 oz 175 lb 175 lb 9.6 oz  Weight (kg) 77.701 kg 79.379 kg 79.652 kg      Telemetry    SR  - Personally Reviewed  ECG     - Personally Reviewed  Physical Exam   GEN: No acute distress.   Neck: No JVD Cardiac: RRR, no murmurs Chest  Nontender  Respiratory: Rhonchi bilaterally   GI: Soft, nontender, non-distended  MS: No edema; \ Neuro:  Nonfocal  Psych: Normal affect   Labs    High Sensitivity Troponin:   Recent Labs  Lab 11/18/23 1928 11/18/23 2220 11/19/23 0330  TROPONINIHS 53* 176* 59*     Chemistry Recent Labs  Lab 11/18/23 1928 11/19/23 0330  NA 138 137  K 3.9 4.4  CL 105 106  CO2 24 22  GLUCOSE 97 95  BUN 16 16  CREATININE 1.30* 1.11  CALCIUM 9.4 9.3  GFRNONAA 59* >60  ANIONGAP 9 9    Lipids   Recent Labs  Lab 11/19/23 0356  CHOL 197  TRIG 104  HDL 34*  LDLCALC 142*  CHOLHDL 5.8    Hematology Recent Labs  Lab 11/18/23 1928 11/19/23 0350  WBC 9.6 8.2  RBC 4.60 4.63  HGB 14.4 14.5  HCT 41.9 42.3  MCV 91.1 91.4  MCH 31.3 31.3  MCHC 34.4 34.3  RDW 14.0 14.0  PLT 253 239   Thyroid  Recent Labs  Lab 11/19/23 0330 11/19/23 0356  TSH  --  3.314  FREET4 1.03  --     BNPNo results for input(s): "BNP", "PROBNP" in the last 168 hours.  DDimer No results for input(s): "DDIMER" in the last 168 hours.   Radiology    DG Chest 2 View Result Date: 11/18/2023 CLINICAL DATA:  Chest pain for 1 month, nausea, history of gastric cancer EXAM: CHEST - 2 VIEW COMPARISON:  12/03/2022 FINDINGS: Frontal and lateral views of the chest demonstrate postsurgical changes from median sternotomy. The cardiac silhouette is unremarkable.  Stable background scarring and emphysema, with no evidence of acute airspace disease, effusion, or pneumothorax. The left lower lobe pulmonary nodule overlying the left anterior seventh rib does not appear significantly changed since prior exam. No acute bony abnormalities. IMPRESSION: 1. Stable left lower lobe pulmonary nodule. 2. Emphysema.  No acute airspace disease. Electronically Signed   By: Jacob Perez M.D.   On: 11/18/2023 20:38    Cardiac Studies   Echo ordered   Patient Profile     73 y.o. male with hx of metastatic GIST tumors   PResents with CP to ER   Assessment & Plan    1  Chest pain    Pt with episodes of CP  SHarp, then pressure, feeling like chest was going to explode      He is currently without complaints  No pain  No SOB    Pt had very minor bump in trop that has come down   (53, 176, 59)   On heparin and NTG  Plavix load given  On 70 mg daily   REcomm  Will plan on chest CT  Will premedicate    I have spoken to Jacob Perez (oncology)   WIll hold Stivarga (VEGF inhibitor)  This has been associated with HTN and can lead to MI    WIll review but hold for now      Tentative plan for [facility] Monday given history and trivial bump in trop      Informed Consent   Shared Decision Making/Informed Consent The risks [stroke (1 in 1000), death (1 in 1000), kidney failure [usually temporary] (1 in 500), bleeding (1 in 200), allergic reaction [possibly serious] (1 in 200)], benefits (diagnostic support and management of coronary artery disease) and alternatives of a cardiac catheterization were discussed in detail with Jacob Perez and he is willing to proceed.   =  2  Oncology    Pt with metastatic GIST tumor  Followed in onc by Jacob Jacob Perez   COmpleting 18 cycles of regorafenib   For CT scans in May to reassess  3   HTN   Pt on amlodipine and toprol at home     SBP 110s to 140s/   Will resume toprol XL 25 for now     4  Lipids  LDL 142 HDL 34   Crestor 10 mg started  WIll increase to 20 mg       For questions or updates, please contact [facility] Please consult [email or web address] for contact info under        Signed, Jacob Pates, MD  11/19/2023, 1:59 PM

## 2023-11-20 ENCOUNTER — Inpatient Hospital Stay (HOSPITAL_COMMUNITY)

## 2023-11-20 DIAGNOSIS — I214 Non-ST elevation (NSTEMI) myocardial infarction: Secondary | ICD-10-CM | POA: Diagnosis not present

## 2023-11-20 DIAGNOSIS — I2489 Other forms of acute ischemic heart disease: Secondary | ICD-10-CM

## 2023-11-20 LAB — CBC
HCT: 42.2 % (ref 39.0–52.0)
Hemoglobin: 14.7 g/dL (ref 13.0–17.0)
MCH: 31 pg (ref 26.0–34.0)
MCHC: 34.8 g/dL (ref 30.0–36.0)
MCV: 89 fL (ref 80.0–100.0)
Platelets: 259 10*3/uL (ref 150–400)
RBC: 4.74 MIL/uL (ref 4.22–5.81)
RDW: 13.5 % (ref 11.5–15.5)
WBC: 6.8 10*3/uL (ref 4.0–10.5)
nRBC: 0 % (ref 0.0–0.2)

## 2023-11-20 LAB — ECHOCARDIOGRAM COMPLETE
Area-P 1/2: 5.02 cm2
Height: 72 in
S' Lateral: 3.4 cm
Weight: 2740.8 [oz_av]

## 2023-11-20 LAB — HEPARIN LEVEL (UNFRACTIONATED): Heparin Unfractionated: 0.34 [IU]/mL (ref 0.30–0.70)

## 2023-11-20 MED ORDER — REGORAFENIB 40 MG PO TABS
80.0000 mg | ORAL_TABLET | Freq: Every day | ORAL | Status: DC
  Administered 2023-11-20: 80 mg via ORAL

## 2023-11-20 MED ORDER — ENSURE ENLIVE PO LIQD
237.0000 mL | Freq: Two times a day (BID) | ORAL | Status: DC
  Administered 2023-11-20 – 2023-11-21 (×2): 237 mL via ORAL

## 2023-11-20 MED ORDER — IOHEXOL 350 MG/ML SOLN
75.0000 mL | Freq: Once | INTRAVENOUS | Status: AC | PRN
  Administered 2023-11-20: 75 mL via INTRAVENOUS

## 2023-11-20 NOTE — Progress Notes (Signed)
 Initial Nutrition Assessment  DOCUMENTATION CODES:   Not applicable  INTERVENTION:   Liberalize diet to Regular  Ensure Enlive po BID, each supplement provides 350 kcal and 20 grams of protein.  Magic cup TID with meals, each supplement provides 290 kcal and 9 grams of protein   NUTRITION DIAGNOSIS:   Increased nutrient needs related to cancer and cancer related treatments as evidenced by estimated needs.  GOAL:   Patient will meet greater than or equal to 90% of their needs  MONITOR:   PO intake  REASON FOR ASSESSMENT:   Consult Poor PO, Wound healing  ASSESSMENT:   Pt with PMH of metastatic GIST s/p 18 cycles of regorafenib, COPD, current smoker admitted for CP.   Per chart review weight has continued to declined over the last year.  Pt was 207 lb 11/2021 -17.8% weight loss over last 2 years.   Medications reviewed and include: prednisone  Labs reviewed    NUTRITION - FOCUSED PHYSICAL EXAM:  Deferred   Diet Order:   Diet Order             Diet regular Room service appropriate? Yes with Assist; Fluid consistency: Thin  Diet effective now                   EDUCATION NEEDS:   Not appropriate for education at this time  Skin:  Skin Assessment: Skin Integrity Issues: Skin Integrity Issues:: Incisions Incisions: L face (dehisced)  Last BM:  4/5  Height:   Ht Readings from Last 1 Encounters:  11/18/23 6' (1.829 m)    Weight:   Wt Readings from Last 1 Encounters:  11/19/23 77.7 kg    BMI:  Body mass index is 23.23 kg/m.  Estimated Nutritional Needs:   Kcal:  2400-2600  Protein:  115-135 grams  Fluid:  > 2L/day  Cammy Copa., RD, LDN, CNSC See AMiON for contact information

## 2023-11-20 NOTE — Progress Notes (Signed)
 Patient added to add on clipboard for cath tomorrow. I will send a message to [facility]/cardmaster inbox to make them aware of the update regarding contrast allergy. Per Dr. Tenny Craw, she states patient said allergy for contrast taken off of chart in [place] and that he had nausea one time but no rash, swelling or SOB. She reports he has since gotten CTs since without premed. She states he is OK to move forward with cath in AM without pre-medication.

## 2023-11-20 NOTE — Progress Notes (Signed)
  Echocardiogram 2D Echocardiogram has been performed.  Jacob Perez 11/20/2023, 10:45 AM

## 2023-11-20 NOTE — H&P (View-Only) (Signed)
 Rounding Note    Patient Name: Jacob Perez Date of Encounter: 11/20/2023  [facility] Cardiologist: New   Subjective   Comfortable  No CP or SOB  Inpatient Medications    Scheduled Meds:  aspirin EC  81 mg Oral Daily   clopidogrel  75 mg Oral Daily   metoprolol succinate  25 mg Oral Daily   mometasone-formoterol  2 puff Inhalation BID   predniSONE  20 mg Oral Q breakfast   rosuvastatin  20 mg Oral QHS   Continuous Infusions:  heparin 1,000 Units/hr (11/19/23 2050)   nitroGLYCERIN 5 mcg/min (11/19/23 0757)   PRN Meds: acetaminophen, albuterol, gabapentin, ondansetron (ZOFRAN) IV, white petrolatum   Vital Signs    Vitals:   11/19/23 2026 11/20/23 0000 11/20/23 0300 11/20/23 0753  BP: 128/71 101/78 119/80 110/81  Pulse: 89 75 74 94  Resp: 15 17 17 19   Temp: 97.9 F (36.6 C) 98.1 F (36.7 C) 97.7 F (36.5 C) 97.8 F (36.6 C)  TempSrc: Oral Oral Oral Oral  SpO2: 94% 92% 91% 95%  Weight:      Height:        Intake/Output Summary (Last 24 hours) at 11/20/2023 0807 Last data filed at 11/20/2023 5621 Gross per 24 hour  Intake 244.58 ml  Output --  Net 244.58 ml      11/19/2023    1:35 PM 11/18/2023    7:20 PM 11/01/2023    9:57 AM  Last 3 Weights  Weight (lbs) 171 lb 4.8 oz 175 lb 175 lb 9.6 oz  Weight (kg) 77.701 kg 79.379 kg 79.652 kg      Telemetry    SR  - Personally Reviewed  ECG     - Personally Reviewed  Physical Exam   GEN: No acute distress.   Neck: No JVD Cardiac: RRR, no murmur Chest  Nontender  Respiratory: CTA GI: Soft, nontender, non-distended  MS: No edema; Labs    High Sensitivity Troponin:   Recent Labs  Lab 11/18/23 1928 11/18/23 2220 11/19/23 0330  TROPONINIHS 53* 176* 59*     Chemistry Recent Labs  Lab 11/18/23 1928 11/19/23 0330  NA 138 137  K 3.9 4.4  CL 105 106  CO2 24 22  GLUCOSE 97 95  BUN 16 16  CREATININE 1.30* 1.11  CALCIUM 9.4 9.3  GFRNONAA 59* >60  ANIONGAP 9 9    Lipids  Recent  Labs  Lab 11/19/23 0356  CHOL 197  TRIG 104  HDL 34*  LDLCALC 142*  CHOLHDL 5.8    Hematology Recent Labs  Lab 11/18/23 1928 11/19/23 0350 11/20/23 0221  WBC 9.6 8.2 6.8  RBC 4.60 4.63 4.74  HGB 14.4 14.5 14.7  HCT 41.9 42.3 42.2  MCV 91.1 91.4 89.0  MCH 31.3 31.3 31.0  MCHC 34.4 34.3 34.8  RDW 14.0 14.0 13.5  PLT 253 239 259   Thyroid  Recent Labs  Lab 11/19/23 0330 11/19/23 0356  TSH  --  3.314  FREET4 1.03  --     BNPNo results for input(s): "BNP", "PROBNP" in the last 168 hours.  DDimer No results for input(s): "DDIMER" in the last 168 hours.   Radiology    DG Chest 2 View Result Date: 11/18/2023 CLINICAL DATA:  Chest pain for 1 month, nausea, history of gastric cancer EXAM: CHEST - 2 VIEW COMPARISON:  12/03/2022 FINDINGS: Frontal and lateral views of the chest demonstrate postsurgical changes from median sternotomy. The cardiac silhouette is unremarkable. Stable  background scarring and emphysema, with no evidence of acute airspace disease, effusion, or pneumothorax. The left lower lobe pulmonary nodule overlying the left anterior seventh rib does not appear significantly changed since prior exam. No acute bony abnormalities. IMPRESSION: 1. Stable left lower lobe pulmonary nodule. 2. Emphysema.  No acute airspace disease. Electronically Signed   By: Jacob Perez M.D.   On: 11/18/2023 20:38    Cardiac Studies   11/20/23   1. Left ventricular ejection fraction, by estimation, is 50 to 55%. The  left ventricle has low normal function. The left ventricle has no regional  wall motion abnormalities. Left ventricular diastolic parameters are  consistent with Grade I diastolic  dysfunction (impaired relaxation).   2. Right ventricular systolic function is normal. The right ventricular  size is normal.   3. The mitral valve is normal in structure. No evidence of mitral valve  regurgitation. No evidence of mitral stenosis.   4. The aortic valve is normal in structure.  Aortic valve regurgitation is  not visualized. No aortic stenosis is present.   5. The inferior vena cava is normal in size with greater than 50%  respiratory variability, suggesting right atrial pressure of 3 mmHg.    Patient Profile     73 y.o. male with hx of metastatic GIST tumors   PResents with CP to ER   Assessment & Plan    1  Chest pain    Pt with episodes of CP  SHarp, then pressure, feeling like chest was going to explode      He is currently without complaints  No pain  No SOB    Pt had very minor bump in trop that has come down   (53, 176, 59)   On heparin and NTG  Plavix load given  On 70 mg daily   REcomm  Plan for cath tomorrow   COnsent obtained yesterday    No IV contrast allergy (pt got nauseated once in past.  Gets CTs in [name] without premeds)  2  Hx of GIST    Follows with Dr Jacob Perez     COmpleting 18 cycles of regorafenib   For CT scans in May to reassess CT of chest today shows stable nodule, liver lesion may be normal variant Other scans to follow in may    3   HTN   Pt on amlodipine and toprol at home     On Toprol XL 25 for now   BP controlled    4  Lipids  LDL 142 HDL 34   Crestor 10 mg started  WIll increase to 20 mg       For questions or updates, please contact [facility] Please consult [email or web address] for contact info under        Signed, Jacob Pates, MD  11/20/2023, 8:07 AM

## 2023-11-20 NOTE — Progress Notes (Signed)
 Rounding Note    Patient Name: Jacob Perez [facility]ag[facility] Quint Date of Encounter: 11/20/2023  [facility] Cardiologist: New   Subjective   Comfortable  No CP or SOB  Inpatient Medications    Scheduled Meds:  aspirin EC  81 mg Oral Daily   clopidogrel  75 mg Oral Daily   metoprolol succinate  25 mg Oral Daily   mometasone-formoterol  2 puff Inhalation BID   predniSONE  20 mg Oral Q breakfast   rosuvastatin  20 mg Oral QHS   Continuous Infusions:  heparin 1,000 Units/hr (11/19/23 2050)   nitroGLYCERIN 5 mcg/min (11/19/23 0757)   PRN Meds: acetaminophen, albuterol, gabapentin, ondansetron (ZOFRAN) IV, white petrolatum   Vital Signs    Vitals:   11/19/23 2026 11/20/23 0000 11/20/23 0300 11/20/23 0753  BP: 128/71 101/78 119/80 110/81  Pulse: 89 75 74 94  Resp: 15 17 17 19   Temp: 97.9 F (36.6 C) 98.1 F (36.7 C) 97.7 F (36.5 C) 97.8 F (36.6 C)  TempSrc: Oral Oral Oral Oral  SpO2: 94% 92% 91% 95%  Weight:      Height:        Intake/Output Summary (Last 24 hours) at 11/20/2023 0807 Last data filed at 11/20/2023 5621 Gross per 24 hour  Intake 244.58 ml  Output --  Net 244.58 ml      11/19/2023    1:35 PM 11/18/2023    7:20 PM 11/01/2023    9:57 AM  Last 3 Weights  Weight (lbs) 171 lb 4.8 oz 175 lb 175 lb 9.6 oz  Weight (kg) 77.701 kg 79.379 kg 79.652 kg      Telemetry    SR  - Personally Reviewed  ECG     - Personally Reviewed  Physical Exam   GEN: No acute distress.   Neck: No JVD Cardiac: RRR, no murmur Chest  Nontender  Respiratory: CTA GI: Soft, nontender, non-distended  MS: No edema; Labs    High Sensitivity Troponin:   Recent Labs  Lab 11/18/23 1928 11/18/23 2220 11/19/23 0330  TROPONINIHS 53* 176* 59*     Chemistry Recent Labs  Lab 11/18/23 1928 11/19/23 0330  NA 138 137  K 3.9 4.4  CL 105 106  CO2 24 22  GLUCOSE 97 95  BUN 16 16  CREATININE 1.30* 1.11  CALCIUM 9.4 9.3  GFRNONAA 59* >60  ANIONGAP 9 9    Lipids  Recent  Labs  Lab 11/19/23 0356  CHOL 197  TRIG 104  HDL 34*  LDLCALC 142*  CHOLHDL 5.8    Hematology Recent Labs  Lab 11/18/23 1928 11/19/23 0350 11/20/23 0221  WBC 9.6 8.2 6.8  RBC 4.60 4.63 4.74  HGB 14.4 14.5 14.7  HCT 41.9 42.3 42.2  MCV 91.1 91.4 89.0  MCH 31.3 31.3 31.0  MCHC 34.4 34.3 34.8  RDW 14.0 14.0 13.5  PLT 253 239 259   Thyroid  Recent Labs  Lab 11/19/23 0330 11/19/23 0356  TSH  --  3.314  FREET4 1.03  --     BNPNo results for input(s): "BNP", "PROBNP" in the last 168 hours.  DDimer No results for input(s): "DDIMER" in the last 168 hours.   Radiology    DG Chest 2 View Result Date: 11/18/2023 CLINICAL DATA:  Chest pain for 1 month, nausea, history of gastric cancer EXAM: CHEST - 2 VIEW COMPARISON:  12/03/2022 FINDINGS: Frontal and lateral views of the chest demonstrate postsurgical changes from median sternotomy. The cardiac silhouette is unremarkable. Stable  background scarring and emphysema, with no evidence of acute airspace disease, effusion, or pneumothorax. The left lower lobe pulmonary nodule overlying the left anterior seventh rib does not appear significantly changed since prior exam. No acute bony abnormalities. IMPRESSION: 1. Stable left lower lobe pulmonary nodule. 2. Emphysema.  No acute airspace disease. Electronically Signed   By: Sharlet Salina M.D.   On: 11/18/2023 20:38    Cardiac Studies   11/20/23   1. Left ventricular ejection fraction, by estimation, is 50 to 55%. The  left ventricle has low normal function. The left ventricle has no regional  wall motion abnormalities. Left ventricular diastolic parameters are  consistent with Grade I diastolic  dysfunction (impaired relaxation).   2. Right ventricular systolic function is normal. The right ventricular  size is normal.   3. The mitral valve is normal in structure. No evidence of mitral valve  regurgitation. No evidence of mitral stenosis.   4. The aortic valve is normal in structure.  Aortic valve regurgitation is  not visualized. No aortic stenosis is present.   5. The inferior vena cava is normal in size with greater than 50%  respiratory variability, suggesting right atrial pressure of 3 mmHg.    Patient Profile     73 y.o. male with hx of metastatic GIST tumors   PResents with CP to ER   Assessment & Plan    1  Chest pain    Pt with episodes of CP  SHarp, then pressure, feeling like chest was going to explode      He is currently without complaints  No pain  No SOB    Pt had very minor bump in trop that has come down   (53, 176, 59)   On heparin and NTG  Plavix load given  On 70 mg daily   REcomm  Plan for cath tomorrow   COnsent obtained yesterday    No IV contrast allergy (pt got nauseated once in past.  Gets CTs in [name] without premeds)  2  Hx of GIST    Follows with Dr Melvyn Neth     COmpleting 18 cycles of regorafenib   For CT scans in May to reassess CT of chest today shows stable nodule, liver lesion may be normal variant Other scans to follow in may    3   HTN   Pt on amlodipine and toprol at home     On Toprol XL 25 for now   BP controlled    4  Lipids  LDL 142 HDL 34   Crestor 10 mg started  WIll increase to 20 mg       For questions or updates, please contact [facility] Please consult [email or web address] for contact info under        Signed, Dietrich Pates, MD  11/20/2023, 8:07 AM

## 2023-11-20 NOTE — Progress Notes (Signed)
 PHARMACY - ANTICOAGULATION CONSULT NOTE  Pharmacy Consult for Heparin  Indication: chest pain/ACS  Allergies  Allergen Reactions   Ace Inhibitors     Cough    Contrast Media [Iodinated Contrast Media] Itching and Nausea And Vomiting   Ioxaglate Itching and Nausea And Vomiting   Metrizamide Itching and Nausea And Vomiting    Patient Measurements: Height: 6' (182.9 cm) Weight: 77.7 kg (171 lb 4.8 oz) IBW/kg (Calculated) : 77.6 HEPARIN DW (KG): 79.4  Vital Signs: Temp: 97.7 F (36.5 C) (04/06 0300) Temp Source: Oral (04/06 0300) BP: 119/80 (04/06 0300) Pulse Rate: 74 (04/06 0300)  Labs: Recent Labs    11/18/23 1928 11/18/23 2220 11/19/23 0330 11/19/23 0350 11/19/23 0759 11/20/23 0221  HGB 14.4  --   --  14.5  --  14.7  HCT 41.9  --   --  42.3  --  42.2  PLT 253  --   --  239  --  259  LABPROT  --   --   --  13.4  --   --   INR  --   --   --  1.0  --   --   HEPARINUNFRC  --   --   --   --  0.40 0.34  CREATININE 1.30*  --  1.11  --   --   --   TROPONINIHS 53* 176* 59*  --   --   --     Estimated Creatinine Clearance: 68 mL/min (by C-G formula based on SCr of 1.11 mg/dL).   Medical History: Past Medical History:  Diagnosis Date   Arthritis    Asthma    COPD (chronic obstructive pulmonary disease) (HCC)    Hypertension    Irregular heart beat      Assessment: 73 y/o M with chest pain and elevated troponin. Starting heparin. Above labs reviewed. PTA meds reviewed.   Heparin level is therapeutic at 0.34 on UFH IV 1000 units/hr. CBC is stable. No signs of bleeding/bruising.  Goal of Therapy:  Heparin level 0.3-0.7 units/ml Monitor platelets by anticoagulation protocol: Yes   Plan:  Continue heparin gtt at 1000 units/hr F/u cards plan with cath  Daily heparin level, CBC, s/s bleeding  Wilmer Floor, PharmD PGY2 Cardiology Pharmacy Resident 11/20/2023 6:14 AM

## 2023-11-21 ENCOUNTER — Other Ambulatory Visit (HOSPITAL_COMMUNITY): Payer: Self-pay

## 2023-11-21 ENCOUNTER — Encounter (HOSPITAL_COMMUNITY)
Admission: EM | Disposition: A | Discharge status: Home / Self Care | Payer: Self-pay | Source: Home / Self Care | Attending: Cardiology

## 2023-11-21 ENCOUNTER — Encounter (HOSPITAL_COMMUNITY): Payer: Self-pay | Admitting: Cardiology

## 2023-11-21 DIAGNOSIS — I25111 Atherosclerotic heart disease of native coronary artery with angina pectoris with documented spasm: Secondary | ICD-10-CM | POA: Diagnosis not present

## 2023-11-21 DIAGNOSIS — E782 Mixed hyperlipidemia: Secondary | ICD-10-CM

## 2023-11-21 DIAGNOSIS — I4819 Other persistent atrial fibrillation: Secondary | ICD-10-CM

## 2023-11-21 DIAGNOSIS — C49A4 Gastrointestinal stromal tumor of large intestine: Secondary | ICD-10-CM

## 2023-11-21 DIAGNOSIS — Z9889 Other specified postprocedural states: Secondary | ICD-10-CM

## 2023-11-21 DIAGNOSIS — I4891 Unspecified atrial fibrillation: Secondary | ICD-10-CM

## 2023-11-21 DIAGNOSIS — Q249 Congenital malformation of heart, unspecified: Secondary | ICD-10-CM

## 2023-11-21 DIAGNOSIS — I2510 Atherosclerotic heart disease of native coronary artery without angina pectoris: Secondary | ICD-10-CM | POA: Diagnosis not present

## 2023-11-21 DIAGNOSIS — I10 Essential (primary) hypertension: Secondary | ICD-10-CM

## 2023-11-21 DIAGNOSIS — I214 Non-ST elevation (NSTEMI) myocardial infarction: Secondary | ICD-10-CM | POA: Diagnosis not present

## 2023-11-21 HISTORY — PX: CORONARY PRESSURE/FFR STUDY: CATH118243

## 2023-11-21 HISTORY — PX: LEFT HEART CATH AND CORONARY ANGIOGRAPHY: CATH118249

## 2023-11-21 LAB — POCT ACTIVATED CLOTTING TIME: Activated Clotting Time: 285 s

## 2023-11-21 LAB — CBC
HCT: 40 % (ref 39.0–52.0)
HCT: 38.2 % — ABNORMAL LOW (ref 39.0–52.0)
Hemoglobin: 14 g/dL (ref 13.0–17.0)
Hemoglobin: 12.9 g/dL — ABNORMAL LOW (ref 13.0–17.0)
MCH: 31.3 pg (ref 26.0–34.0)
MCH: 31.2 pg (ref 26.0–34.0)
MCHC: 35 g/dL (ref 30.0–36.0)
MCHC: 33.8 g/dL (ref 30.0–36.0)
MCV: 89.5 fL (ref 80.0–100.0)
MCV: 92.5 fL (ref 80.0–100.0)
Platelets: 264 10*3/uL (ref 150–400)
Platelets: 247 10*3/uL (ref 150–400)
RBC: 4.47 MIL/uL (ref 4.22–5.81)
RBC: 4.13 MIL/uL — ABNORMAL LOW (ref 4.22–5.81)
RDW: 14 % (ref 11.5–15.5)
RDW: 14.2 % (ref 11.5–15.5)
WBC: 16.7 10*3/uL — ABNORMAL HIGH (ref 4.0–10.5)
WBC: 16.4 10*3/uL — ABNORMAL HIGH (ref 4.0–10.5)
nRBC: 0 % (ref 0.0–0.2)
nRBC: 0 % (ref 0.0–0.2)

## 2023-11-21 LAB — HEPARIN LEVEL (UNFRACTIONATED): Heparin Unfractionated: 0.35 [IU]/mL (ref 0.30–0.70)

## 2023-11-21 LAB — CREATININE, SERUM
Creatinine, Ser: 1.06 mg/dL (ref 0.61–1.24)
GFR, Estimated: >60 mL/min (ref >60)

## 2023-11-21 LAB — HEMOGLOBIN A1C
Hgb A1c MFr Bld: 5.8 % — ABNORMAL HIGH (ref 4.8–5.6)
Mean Plasma Glucose: 120 mg/dL

## 2023-11-21 LAB — LIPOPROTEIN A (LPA): Lipoprotein (a): 39.7 nmol/L — ABNORMAL HIGH (ref <75.0)

## 2023-11-21 SURGERY — LEFT HEART CATH AND CORONARY ANGIOGRAPHY
Anesthesia: LOCAL

## 2023-11-21 MED ORDER — SODIUM CHLORIDE 0.9% FLUSH
3.0000 mL | Freq: Two times a day (BID) | INTRAVENOUS | Status: DC
  Administered 2023-11-21: 3 mL via INTRAVENOUS

## 2023-11-21 MED ORDER — FENTANYL CITRATE (PF) 100 MCG/2ML IJ SOLN
INTRAMUSCULAR | Status: AC
  Filled 2023-11-21: qty 2

## 2023-11-21 MED ORDER — ENOXAPARIN SODIUM 40 MG/0.4ML IJ SOSY: 40.0000 mg | PREFILLED_SYRINGE | INTRAMUSCULAR | Status: DC

## 2023-11-21 MED ORDER — HEPARIN SODIUM (PORCINE) 1000 UNIT/ML IJ SOLN
INTRAMUSCULAR | Status: AC
  Filled 2023-11-21: qty 10

## 2023-11-21 MED ORDER — ISOSORBIDE MONONITRATE ER 30 MG PO TB24
30.0000 mg | ORAL_TABLET | Freq: Every day | ORAL | 2 refills | Status: DC
  Filled 2023-11-21: qty 30, 30d supply, fill #0

## 2023-11-21 MED ORDER — LIDOCAINE HCL (PF) 1 % IJ SOLN
INTRAMUSCULAR | Status: DC | PRN
  Administered 2023-11-21: 5 mL

## 2023-11-21 MED ORDER — FENTANYL CITRATE (PF) 100 MCG/2ML IJ SOLN
INTRAMUSCULAR | Status: DC | PRN
  Administered 2023-11-21: 25 ug via INTRAVENOUS

## 2023-11-21 MED ORDER — NITROGLYCERIN 0.4 MG SL SUBL
0.4000 mg | SUBLINGUAL_TABLET | SUBLINGUAL | 2 refills | Status: AC | PRN
Start: 2023-11-21 — End: 2024-11-20
  Filled 2023-11-21: qty 25, 7d supply, fill #0

## 2023-11-21 MED ORDER — SODIUM CHLORIDE 0.9 % WEIGHT BASED INFUSION
3.0000 mL/kg/h | INTRAVENOUS | Status: DC
  Administered 2023-11-21: 3 mL/kg/h via INTRAVENOUS

## 2023-11-21 MED ORDER — CLOPIDOGREL BISULFATE 300 MG PO TABS
ORAL_TABLET | ORAL | Status: DC | PRN
  Administered 2023-11-21: 600 mg via ORAL

## 2023-11-21 MED ORDER — LIDOCAINE HCL (PF) 1 % IJ SOLN
INTRAMUSCULAR | Status: AC
  Filled 2023-11-21: qty 30

## 2023-11-21 MED ORDER — SODIUM CHLORIDE 0.9 % IV SOLN: 250.0000 mL | INTRAVENOUS | Status: DC | PRN

## 2023-11-21 MED ORDER — NITROGLYCERIN 1 MG/10 ML FOR IR/CATH LAB
INTRA_ARTERIAL | Status: AC
  Filled 2023-11-21: qty 10

## 2023-11-21 MED ORDER — ASPIRIN 81 MG PO CHEW
81.0000 mg | CHEWABLE_TABLET | ORAL | Status: AC
  Administered 2023-11-21: 81 mg via ORAL
  Filled 2023-11-21: qty 1

## 2023-11-21 MED ORDER — MIDAZOLAM HCL 2 MG/2ML IJ SOLN
INTRAMUSCULAR | Status: DC | PRN
  Administered 2023-11-21: 2 mg via INTRAVENOUS

## 2023-11-21 MED ORDER — SODIUM CHLORIDE 0.9% FLUSH: 3.0000 mL | INTRAVENOUS | Status: DC | PRN

## 2023-11-21 MED ORDER — ASPIRIN 81 MG PO TBEC
81.0000 mg | DELAYED_RELEASE_TABLET | Freq: Every day | ORAL | 2 refills | Status: DC
  Filled 2023-11-21: qty 30, 30d supply, fill #0

## 2023-11-21 MED ORDER — NITROGLYCERIN 1 MG/10 ML FOR IR/CATH LAB
INTRA_ARTERIAL | Status: DC | PRN
  Administered 2023-11-21 (×2): 200 ug via INTRACORONARY

## 2023-11-21 MED ORDER — OXYCODONE HCL 5 MG PO TABS
10.0000 mg | ORAL_TABLET | Freq: Four times a day (QID) | ORAL | Status: DC | PRN
  Administered 2023-11-21: 10 mg via ORAL
  Filled 2023-11-21: qty 2

## 2023-11-21 MED ORDER — OXYCODONE HCL 5 MG PO TABS
10.0000 mg | ORAL_TABLET | ORAL | Status: DC | PRN
  Administered 2023-11-21: 10 mg via ORAL
  Filled 2023-11-21: qty 2

## 2023-11-21 MED ORDER — METOPROLOL SUCCINATE ER 25 MG PO TB24
25.0000 mg | ORAL_TABLET | Freq: Every day | ORAL | 2 refills | Status: AC
  Filled 2023-11-21: qty 30, 30d supply, fill #0

## 2023-11-21 MED ORDER — HEPARIN (PORCINE) IN NACL 1000-0.9 UT/500ML-% IV SOLN
INTRAVENOUS | Status: DC | PRN
  Administered 2023-11-21: 1500 mL

## 2023-11-21 MED ORDER — SODIUM CHLORIDE 0.9 % WEIGHT BASED INFUSION
1.0000 mL/kg/h | INTRAVENOUS | Status: DC
  Administered 2023-11-21: 1 mL/kg/h via INTRAVENOUS

## 2023-11-21 MED ORDER — VERAPAMIL HCL 2.5 MG/ML IV SOLN
INTRAVENOUS | Status: AC
  Filled 2023-11-21: qty 2

## 2023-11-21 MED ORDER — MIDAZOLAM HCL 2 MG/2ML IJ SOLN
INTRAMUSCULAR | Status: AC
  Filled 2023-11-21: qty 2

## 2023-11-21 MED ORDER — IOHEXOL 350 MG/ML SOLN
INTRAVENOUS | Status: DC | PRN
  Administered 2023-11-21: 110 mL

## 2023-11-21 MED ORDER — HEPARIN SODIUM (PORCINE) 1000 UNIT/ML IJ SOLN
INTRAMUSCULAR | Status: DC | PRN
  Administered 2023-11-21 (×2): 4000 [IU] via INTRAVENOUS

## 2023-11-21 MED ORDER — APIXABAN 5 MG PO TABS: 5.0000 mg | ORAL_TABLET | Freq: Two times a day (BID) | ORAL | 2 refills | Status: DC

## 2023-11-21 MED ORDER — ISOSORBIDE MONONITRATE ER 30 MG PO TB24
30.0000 mg | ORAL_TABLET | Freq: Every day | ORAL | Status: DC
  Administered 2023-11-21: 30 mg via ORAL
  Filled 2023-11-21: qty 1

## 2023-11-21 MED ORDER — ROSUVASTATIN CALCIUM 20 MG PO TABS
20.0000 mg | ORAL_TABLET | Freq: Every day | ORAL | 2 refills | Status: DC
  Filled 2023-11-21: qty 30, 30d supply, fill #0
  Filled 2023-12-26: qty 30, 30d supply, fill #1

## 2023-11-21 SURGICAL SUPPLY — 13 items
CATH 5FR JL3.5 JR4 ANG PIG MP (CATHETERS) IMPLANT
CATH LAUNCHER 6FR JR4 (CATHETERS) IMPLANT
CATH VISTA GUIDE 6FR XB3.5 EPK (CATHETERS) IMPLANT
COVER PRB 48X5XTLSCP FOLD TPE (BAG) IMPLANT
DEVICE RAD COMP TR BAND LRG (VASCULAR PRODUCTS) IMPLANT
GLIDESHEATH SLEND SS 6F .021 (SHEATH) IMPLANT
GUIDEWIRE INQWIRE 1.5J.035X260 (WIRE) IMPLANT
GUIDEWIRE PRESSURE X 175 (WIRE) IMPLANT
INQWIRE 1.5J .035X260CM (WIRE) ×2 IMPLANT
KIT ENCORE 26 ADVANTAGE (KITS) IMPLANT
PACK CARDIAC CATHETERIZATION (CUSTOM PROCEDURE TRAY) ×2 IMPLANT
SET ATX-X65L (MISCELLANEOUS) IMPLANT
WIRE ASAHI PROWATER 180CM (WIRE) IMPLANT

## 2023-11-21 NOTE — Discharge Summary (Addendum)
 Discharge Summary    Patient ID: Jacob Perez MRN: [id number]; DOB: September 09, 1950  Admit date: 11/18/2023 Discharge date: 11/21/2023  PCP:  Lonie Peak, PA-C    HeartCare Providers Cardiologist:  New to Dr Tenny Craw      Discharge Diagnoses    Principal Problem:   NSTEMI (non-ST elevated myocardial infarction) [facility]) Active Problems:   Nonobstructive atherosclerosis of coronary artery   Coronary artery disease involving native coronary artery of native heart with angina pectoris with documented spasm (HCC)   Congenital heart disease in adult   Benign hypertension   Mixed hyperlipidemia   Persistent atrial fibrillation (HCC)   History of cardiac ablation for atrial fibrillation (HCC)   GIST (gastrointestinal stroma tumor), malignant, colon (HCC) Nonobstructive CAD/coronary artery vasospasm. History of congenital heart disease status post surgery 1990s Hypertension HLD Persistent atrial fibrillation, status post ablation 2020 GIST tumor   Diagnostic Studies/Procedures    LHC today:  Fluoro time: 9.9 (min) DAP: 14398 (mGycm2) Cumulative Air Kerma: 228 (mGy)  Diagnostic Dominance: Right   Nonobstructive CAD. There appeared to be moderate disease in the LCx and RCA that improved with IC Ntg suggesting a component of vasospasm. Normal flow analysis of both arteries Normal LVEDP   Plan: will add nitrates to medical therapy. Anticipate same day DC.      Echo from 11/20/23:  1. Left ventricular ejection fraction, by estimation, is 50 to 55%. The  left ventricle has low normal function. The left ventricle has no regional  wall motion abnormalities. Left ventricular diastolic parameters are  consistent with Grade I diastolic  dysfunction (impaired relaxation).   2. Right ventricular systolic function is normal. The right ventricular  size is normal.   3. The mitral valve is normal in structure. No evidence of mitral valve  regurgitation. No evidence of mitral  stenosis.   4. The aortic valve is normal in structure. Aortic valve regurgitation is  not visualized. No aortic stenosis is present.   5. The inferior vena cava is normal in size with greater than 50%  respiratory variability, suggesting right atrial pressure of 3 mmHg.      History of Present Illness      H&P per Dr Hyacinth Meeker on 11/19/23:  Jacob Perez is a 73 y.o. 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  Noted that he has been having on and off chest pain for the last several weeks.  Was doing okay on 11/19/23 and went with his brother fishing and had to leave early due to severe chest pain.  Chest pain had been progressive and waxing and waning.  Initially en route to the emergency department he took full dose aspirin and had some relief.  However chest pain recurs while in the emergency department.  Troponins went from 50s to 170s.  He was started on nitroglycerin infusion and eventually chest pain subsided.  All other labs on admission were largely unremarkable.   Of note he has been treated actively for metastatic GI stromal tumor.  Per his report and recent oncology notes this has been stable.  He also has COPD and is an active smoker.  Smokes about 1 pack/day.  Hospital Course     Consultants:  N/A    NSTEMI Non-obstructive CAD Coronary vasospasm  - presented with several weeks of chest pain concerning for angina on 11/18/23 - Hs trop 53 >176 >59  - EKG had no acute changes - LDL 142, A1C 5.8%, TSH WNL  - Echo 4/6 showed LVEF 50-55% low normal fucntion, no RWMA, grade I DD,  normal RV (LVEF was 40-45% on Echo at [facility] on 03/11/22) - LHC today showed nonobstructive CAD. There appeared to be moderate disease  in the LCx and RCA that improved with IC Ntg suggesting a component of vasospasm. Normal flow analysis of both arteries. Normal LVEDP  - Medical therapy: stop heparin gtt, add ASA 81mg  daily and Plavix 75mg  daily and imdur 30mg  daily this admission, PTA metoprolol XL 25mg  continued, PTA crestor increased to 20mg  daily; Will stop Plavix 75mg  daily to avoid increased risk of bleeding due to his cancer burden. Continue the rest of above regimen at time of discharge, all new meds sent to [facility] pharmacy today  - Post cath care reviewed at bedside  - Recommended he follows up w/ his primary cardiologist s/p discharge. However, as an alternative a follow up arranged 12/12/23 at [facility] office, no sooner appt available, to prevent continuity of care / avoid lost to follow up.   Persistent A fib/status post ablation - noted per chart review from [facility] today, hx of ablation 2020 and flecainide use, no recurrence so far, off flecainide and Xarelto for some time and his cardiologist at [facility] were OK with it, he does not recall why he were taken off of OAC and states he never had any major bleeding in the past. Dr Odis Hollingshead had recommend DOAC for CHADSVAS of 3, but patient wishes to defer and speak to his oncologist more regarding bleeding risk, consider ILR for A fib burden assessment if OAC is not started.    HTN - BP mildly elevated today, on PTA Metoprolol XL 25mg  daily, not taking PTA amlodipine, continued metoprolol and BP controlled, added Imdur as above   Leukocytosis  - WBC elevated 16700 today, was 6800 yesterday  - afebrile, no signs of infection, maybe due to steroid use (on prednisone for appetite), will need to follow up with PCP in a week to re-assess   Metastatic gastrointestinal stromal tumor  - follows Dr Melvyn Neth, on regorafenib, no change of home meds, return to oncology for follow up as scheduled   Chronic pain due to malignancy - no change of home meds, PRN oxycodone provided this admission    COPD - no acute exacerbation - home inhaler continued, no change   Pre-diabetes - A1C 5.8%, discussed lifestyle modification, need follow up with PCP     Did the patient have an acute coronary syndrome (MI, NSTEMI, STEMI, etc) this admission?:  Type II NSTEMI Did the patient have a percutaneous coronary intervention (stent / angioplasty)?:  No.    The patient will be scheduled for a TOC follow up appointment in 21 days.  A message has been sent to the [facility] and Scheduling Pool at the office where the patient should be seen for follow up.  _____________  Discharge Vitals Blood pressure 136/89, pulse 92, temperature 97.6 F (36.4 C), temperature source Oral, resp. rate 19, height 6' (1.829 m), weight 74.1 kg, SpO2 98%.  Filed Weights   11/18/23 1920 11/19/23 1335 11/21/23 0525  Weight: 79.4 kg 77.7 kg 74.1 kg   Physical exam:  See attending progress notes of addendum for detailed physical exam  Alert and oriented x3 No respiratory distress Right radial wrist site with small amount of previous bleeding noted, no hematoma    Labs & Radiologic Studies    CBC Recent Labs    11/21/23 0216 11/21/23 1039  WBC 16.7* 16.4*  HGB 14.0 12.9*  HCT 40.0 38.2*  MCV 89.5 92.5  PLT 264 247   Basic Metabolic Panel Recent Labs    78/29/56 1928 11/19/23 0330 11/21/23 1039  NA 138 137  --   K 3.9 4.4  --   CL 105 106  --   CO2 24 22  --   GLUCOSE 97 95  --   BUN 16 16  --   CREATININE 1.30* 1.11 1.06  CALCIUM 9.4 9.3  --    Liver Function Tests No results for input(s): "AST", "ALT", "ALKPHOS", "BILITOT", "PROT", "ALBUMIN" in the last 72 hours. No results for input(s): "LIPASE", "AMYLASE" in the last 72 hours. High Sensitivity Troponin:   Recent Labs  Lab 11/18/23 1928 11/18/23 2220 11/19/23 0330  TROPONINIHS 53* 176* 59*    BNP Invalid input(s): "POCBNP" D-Dimer No results for input(s): "DDIMER" in the last 72 hours. Hemoglobin A1C Recent Labs     11/19/23 0356  HGBA1C 5.8*   Fasting Lipid Panel Recent Labs    11/19/23 0356  CHOL 197  HDL 34*  LDLCALC 142*  TRIG 104  CHOLHDL 5.8   Thyroid Function Tests Recent Labs    11/19/23 0356  TSH 3.314   _____________  CARDIAC CATHETERIZATION Result Date: 11/21/2023 Nonobstructive CAD. There appeared to be moderate disease in the LCx and RCA that improved with IC Ntg suggesting a component of vasospasm. Normal flow analysis of both arteries Normal LVEDP Plan: will add nitrates to medical therapy. Anticipate same day DC.   ECHOCARDIOGRAM COMPLETE Result Date: 11/20/2023    ECHOCARDIOGRAM REPORT   Patient Name:   Jacob Perez Date of Exam: 11/20/2023 Medical Rec #:  [id number]        Height:       72.0 in Accession #:    [id number]       Weight:       171.3 lb Date of Birth:  1951-02-02        BSA:          1.995 m Patient Age:    72 years         BP:           110/81 mmHg Patient Gender: M                HR:           98 bpm. Exam Location:  Inpatient Procedure: 2D Echo, Color Doppler and Cardiac Doppler (Both Spectral and Color            Flow Doppler were utilized during procedure). Indications:    Acute ischemic heart disease, unspecified I24.9  History:        Patient has no prior history of Echocardiogram examinations.                 COPD; Risk Factors:Hypertension and Current Smoker.  Sonographer:    Rosaland Lao Referring Phys: [id number] DENNIS NARCISSE JR  Sonographer Comments: Image acquisition challenging due to COPD and Image acquisition challenging due to respiratory motion. IMPRESSIONS  1. Left ventricular ejection fraction, by estimation, is 50 to 55%. The left ventricle has low normal function. The left ventricle has no regional wall motion abnormalities. Left ventricular diastolic parameters  are consistent with Grade I diastolic dysfunction (impaired relaxation).  2. Right ventricular systolic function is normal. The right ventricular size is normal.  3. The mitral valve is  normal in structure. No evidence of mitral valve regurgitation. No evidence of mitral stenosis.  4. The aortic valve is normal in structure. Aortic valve regurgitation is not visualized. No aortic stenosis is present.  5. The inferior vena cava is normal in size with greater than 50% respiratory variability, suggesting right atrial pressure of 3 mmHg. FINDINGS  Left Ventricle: Left ventricular ejection fraction, by estimation, is 50 to 55%. The left ventricle has low normal function. The left ventricle has no regional wall motion abnormalities. The left ventricular internal cavity size was normal in size. There is no left ventricular hypertrophy. Left ventricular diastolic parameters are consistent with Grade I diastolic dysfunction (impaired relaxation). Right Ventricle: The right ventricular size is normal. No increase in right ventricular wall thickness. Right ventricular systolic function is normal. Left Atrium: Left atrial size was normal in size. Right Atrium: Right atrial size was normal in size. Pericardium: There is no evidence of pericardial effusion. Mitral Valve: The mitral valve is normal in structure. No evidence of mitral valve regurgitation. No evidence of mitral valve stenosis. Tricuspid Valve: The tricuspid valve is normal in structure. Tricuspid valve regurgitation is not demonstrated. No evidence of tricuspid stenosis. Aortic Valve: The aortic valve is normal in structure. Aortic valve regurgitation is not visualized. No aortic stenosis is present. Pulmonic Valve: The pulmonic valve was normal in structure. Pulmonic valve regurgitation is not visualized. No evidence of pulmonic stenosis. Aorta: The aortic root is normal in size and structure. Venous: The inferior vena cava is normal in size with greater than 50% respiratory variability, suggesting right atrial pressure of 3 mmHg. IAS/Shunts: No atrial level shunt detected by color flow Doppler.  LEFT VENTRICLE PLAX 2D LVIDd:         4.90 cm    Diastology LVIDs:         3.40 cm   LV e' medial:    9.90 cm/s LV PW:         0.80 cm   LV E/e' medial:  4.5 LV IVS:        0.80 cm   LV e' lateral:   9.11 cm/s LVOT diam:     2.20 cm   LV E/e' lateral: 4.9 LV SV:         37 LV SV Index:   18 LVOT Area:     3.80 cm  RIGHT VENTRICLE             IVC RV Basal diam:  3.00 cm     IVC diam: 1.50 cm RV S prime:     11.30 cm/s TAPSE (M-mode): 1.4 cm LEFT ATRIUM             Index        RIGHT ATRIUM           Index LA diam:        3.00 cm 1.50 cm/m   RA Area:     11.40 cm LA Vol (A2C):   29.0 ml 14.54 ml/m  RA Volume:   23.90 ml  11.98 ml/m LA Vol (A4C):   13.9 ml 6.97 ml/m LA Biplane Vol: 21.2 ml 10.63 ml/m  AORTIC VALVE LVOT Vmax:   65.30 cm/s LVOT Vmean:  41.900 cm/s LVOT VTI:    0.097 m  AORTA Ao Root diam: 3.50 cm Ao  Asc diam:  3.20 cm MITRAL VALVE MV Area (PHT): 5.02 cm    SHUNTS MV Decel Time: 151 msec    Systemic VTI:  0.10 m MV E velocity: 44.30 cm/s  Systemic Diam: 2.20 cm MV A velocity: 61.20 cm/s MV E/A ratio:  0.72 Donato Schultz MD Electronically signed by Donato Schultz MD Signature Date/Time: 11/20/2023/11:34:06 AM    Final    CT CHEST W CONTRAST Result Date: 11/20/2023 CLINICAL DATA:  Chest pain. Metastatic GI stromal tumor. Restaging. * Tracking Code: BO * EXAM: CT CHEST WITH CONTRAST TECHNIQUE: Multidetector CT imaging of the chest was performed during intravenous contrast administration. RADIATION DOSE REDUCTION: This exam was performed according to the departmental dose-optimization program which includes automated exposure control, adjustment of the mA and/or kV according to patient size and/or use of iterative reconstruction technique. CONTRAST:  75mL OMNIPAQUE IOHEXOL 350 MG/ML SOLN COMPARISON:  [facility] hospital CT 09/05/2023 FINDINGS: Cardiovascular: The heart size is normal. No substantial pericardial effusion. Coronary artery calcification is evident. Mild atherosclerotic calcification is noted in the wall of the thoracic aorta. Right main  pulmonary artery enlargement at 3 cm diameter. Mediastinum/Nodes: No mediastinal lymphadenopathy. There is no hilar lymphadenopathy. The esophagus has normal imaging features. There is no axillary lymphadenopathy. Lungs/Pleura: Centrilobular and paraseptal emphysema evident. No suspicious pulmonary nodule or mass in the right lung. 1.7 x 1.2 cm left lower lobe pulmonary nodule on 123/4 is not appreciably changed in the interval. No focal airspace consolidation. No pleural effusion. Upper Abdomen: 10 mm focus of hyperenhancement is identified in the central right liver on image 151/3 and coronal 62/6. This was not definitely seen on prior imaging. Musculoskeletal: No worrisome lytic or sclerotic osseous abnormality. IMPRESSION: 1. 1.7 x 1.2 cm left lower lobe pulmonary nodule is not appreciably changed in the interval. This nodule has been present on multiple prior studies dating back to at least 05/28/2020 and is stable in size since that time consistent with benign etiology. 2. 10 mm focus of hyperenhancement in the central right liver. This was not definitely seen on prior imaging. This is nonspecific and may be related to perfusional variation. Close follow-up recommended given the history of GI stromal tumor. Abdominal MRI with and without contrast may prove helpful to further evaluate as clinically warranted. 3. Right main pulmonary artery enlargement at 3 cm diameter. Imaging features can be seen in the setting of pulmonary arterial hypertension. Electronically Signed   By: Kennith Center M.D.   On: 11/20/2023 09:39   DG Chest 2 View Result Date: 11/18/2023 CLINICAL DATA:  Chest pain for 1 month, nausea, history of gastric cancer EXAM: CHEST - 2 VIEW COMPARISON:  12/03/2022 FINDINGS: Frontal and lateral views of the chest demonstrate postsurgical changes from median sternotomy. The cardiac silhouette is unremarkable. Stable background scarring and emphysema, with no evidence of acute airspace disease,  effusion, or pneumothorax. The left lower lobe pulmonary nodule overlying the left anterior seventh rib does not appear significantly changed since prior exam. No acute bony abnormalities. IMPRESSION: 1. Stable left lower lobe pulmonary nodule. 2. Emphysema.  No acute airspace disease. Electronically Signed   By: Sharlet Salina M.D.   On: 11/18/2023 20:38   Disposition   Patient is seen by Dr Odis Hollingshead and myself today, deemed stable for discharge to home after cath. Medical therapy, post cath care, follow up plan reviewed in detail. All new meds sent to [facility] pharmacy today. Pt is being discharged home today in good condition.  Follow-up Plans & Appointments  Follow-up Information     Gaston Islam., NP Follow up on 12/12/2023.   Specialty: Cardiology Why: at 8:25am for your cardiology follow up appointment Contact information: [address] [place] [address] [phone]                Discharge Instructions     AMB referral to Phase II Cardiac Rehabilitation   Complete by: As directed    Diagnosis: NSTEMI   After initial evaluation and assessments completed: Virtual Based Care may be provided alone or in conjunction with Phase 2 Cardiac Rehab based on patient barriers.: Yes   Intensive Cardiac Rehabilitation (ICR) [facility] location only OR Traditional Cardiac Rehabilitation (TCR) *If criteria for ICR are not met will enroll in TCR [facility] only): Yes   Diet - low sodium heart healthy   Complete by: As directed    Discharge instructions   Complete by: As directed    Radial Site Care  Refer to this sheet in the next few weeks. These instructions provide you with information on caring for yourself after your procedure. Your caregiver may also give you more specific instructions. Your treatment has been planned according to current medical practices, but problems sometimes occur. Call your caregiver if you have any problems or questions after your  procedure.  HOME CARE INSTRUCTIONS You may shower the day after the procedure. Remove the bandage (dressing) and gently wash the site with plain soap and water. Gently pat the site dry.  Do not apply powder or lotion to the site.  Do not submerge the affected site in water for 3 to 5 days.  Inspect the site at least twice daily.  Do not flex or bend the affected arm for 24 hours.  No lifting over 5 pounds (2.3 kg) for 5 days after your procedure.  Do not drive home if you are discharged the same day of the procedure. Have someone else drive you.  You may drive 24 hours after the procedure unless otherwise instructed by your caregiver.   What to expect: Any bruising will usually fade within 1 to 2 weeks.  Blood that collects in the tissue (hematoma) may be painful to the touch. It should usually decrease in size and tenderness within 1 to 2 weeks.   SEEK IMMEDIATE MEDICAL CARE IF: You have unusual pain at the radial site.  You have redness, warmth, swelling, or pain at the radial site.  You have drainage (other than a small amount of blood on the dressing).  You have chills.  You have a fever or persistent symptoms for more than 72 hours.  You have a fever and your symptoms suddenly get worse.  Your arm becomes pale, cool, tingly, or numb.  You have heavy bleeding from the site. Hold pressure on the site.   Increase activity slowly   Complete by: As directed    No wound care   Complete by: As directed         Discharge Medications   Allergies as of 11/21/2023       Reactions   Ace Inhibitors    Cough   Ioxaglate Itching, Nausea And Vomiting   Metrizamide Itching, Nausea And Vomiting        Medication List     STOP taking these medications    amLODipine 10 MG tablet Commonly known as: NORVASC   augmented betamethasone dipropionate 0.05 % cream Commonly known as: DIPROLENE-AF   megestrol 400 MG/10ML suspension Commonly known as:  MEGACE       TAKE these  medications    albuterol 108 (90 Base) MCG/ACT inhaler Commonly known as: VENTOLIN HFA Inhale 2 puffs into the lungs every 4 (four) hours as needed for wheezing or shortness of breath.   albuterol (2.5 MG/3ML) 0.083% nebulizer solution Commonly known as: PROVENTIL Take 2.5 mg by nebulization every 6 (six) hours as needed for wheezing or shortness of breath.   aspirin EC 81 MG tablet Take 1 tablet (81 mg total) by mouth daily. Swallow whole.   gabapentin 300 MG capsule Commonly known as: NEURONTIN Take 300 mg by mouth daily as needed (for breakthrough pain).   isosorbide mononitrate 30 MG 24 hr tablet Commonly known as: IMDUR Take 1 tablet (30 mg total) by mouth daily. Start taking on: November 22, 2023   metoprolol succinate 25 MG 24 hr tablet Commonly known as: TOPROL-XL Take 1 tablet (25 mg total) by mouth daily. Start taking on: November 22, 2023   nitroGLYCERIN 0.4 MG SL tablet Commonly known as: Nitrostat Place 1 tablet (0.4 mg total) under the tongue every 5 (five) minutes as needed for chest pain.   Oxycodone HCl 10 MG Tabs Take 1 tablet (10 mg total) by mouth every 4 (four) hours as needed. 1 tab po q6h prn   predniSONE 20 MG tablet Commonly known as: DELTASONE Take 1 tablet (20 mg total) by mouth daily with breakfast. TAKE FOR APPETITE STIMULATION   regorafenib 40 MG tablet Commonly known as: STIVARGA Take 2 tablets (80 mg total) by mouth daily with breakfast. Take with low fat meal. For 21 days, then 7 days off, every 28 days. Caution: Chemotherapy.   rosuvastatin 20 MG tablet Commonly known as: CRESTOR Take 1 tablet (20 mg total) by mouth at bedtime. What changed:  medication strength how much to take   sulfamethoxazole-trimethoprim 800-160 MG tablet Commonly known as: BACTRIM DS Take 1 tablet by mouth 2 (two) times daily.   Symbicort 160-4.5 MCG/ACT inhaler Generic drug: budesonide-formoterol SMARTSIG:2 Puff(s) By Mouth Twice Daily   VITAMIN D2 + K1  PO Take 2 tablets by mouth daily.           Outstanding Labs/Studies    Duration of Discharge Encounter: APP Time: 22 minutes   Signed, Cyndi Bender, NP 11/21/2023  ADDENDUM:   Patient seen and examined with Cyndi Bender, NP.  I personally taken a history, examined the patient, reviewed relevant notes,  laboratory data / imaging studies.  I performed a substantive portion of this encounter and formulated the important aspects of the plan.  I agree with the APP's note, impression, and recommendations; however, I have edited the note to reflect changes or salient points.   Patient is accompanied by his wife at bedside. Denies anginal chest pain. Status post heart catheterization  PHYSICAL EXAM: Today's Vitals   11/21/23 0947 11/21/23 1143 11/21/23 1234 11/21/23 1318  BP: (!) 141/76 136/89    Pulse: 84 92    Resp: 16 19    Temp:  97.6 F (36.4 C)    TempSrc:  Oral    SpO2: 97% 98%    Weight:      Height:      PainSc:   9  5    Body mass index is 22.16 kg/m.   Net IO Since Admission: 938.65 mL [11/21/23 1325]  Filed Weights   11/18/23 1920 11/19/23 1335 11/21/23 0525  Weight: 79.4 kg 77.7 kg 74.1 kg    Physical Exam  Constitutional: No  distress. He appears chronically ill.  hemodynamically stable  Neck: No JVD present.  Cardiovascular: Normal rate, regular rhythm, S1 normal and S2 normal. Exam reveals no gallop, no S3 and no S4.  No murmur heard. Pulmonary/Chest: Effort normal and breath sounds normal. No stridor. He has no wheezes. He has no rales.  Prior sternotomy site is well-healed.  Abdominal: Soft. Bowel sounds are normal. He exhibits no distension. There is abdominal tenderness.  Musculoskeletal:        General: No edema.     Cervical back: Neck supple.  Skin: Skin is warm.    EKG: (personally reviewed by me) No new EKG  Telemetry: (personally reviewed by me) Sinus rhythm   Impression: Nonobstructive CAD/coronary artery  vasospasm. NSTEMI History of congenital heart disease status post surgery 1990s Hypertension HLD Persistent atrial fibrillation, status post ablation 2020 GIST tumor  Recommendations: Nonobstructive CAD/coronary artery vasospasm. NSTEMI -Denies anginal chest pain. -Continue aspirin 81 mg p.o. daily. -Increase statin therapy to rosuvastatin 20 mg p.o. daily -Reemphasized the importance of secondary prevention with focus on improving her modifiable cardiovascular risk factors such as glycemic control, lipid management, blood pressure control,  smoking cessation. -Start isosorbide mononitrate.  Spoke to the patient and wife that being on isosorbide mononitrate he should not take erectile dysfunction medications which include but not limited to Viagra/sildenafil, Cialis/tadalafil, Levitra/vardenafil due to drug-drug interactions.  They both verbalized understanding. Will arrange outpatient follow-up  History of congenital heart disease status post surgery 1990s: No additional details noted at this time.  Will need antibiotic prophylaxis prior to dental procedures.  Hypertension: Continue Imdur and Toprol-XL  Hyperlipidemia: LDL 142, increase rosuvastatin to 20 mg p.o. daily.  Recommend rechecking fasting lipids in 6 weeks to reevaluate.  Persistent atrial fibrillation, status post ablation 2020 Was on anticoagulation but discontinued due to CHA2DS2-VASc score, based on records available in Care Everywhere. His CHA2DS2-VASc score is now 32 (age, hypertension, aortic atherosclerosis) We spoke about restarting oral anticoagulation given his CHA2DS2-VASc score.  In addition, patient states that he feels that he goes in and out of atrial fibrillation.  We discussed the risk, benefits, and alternatives to oral anticoagulation.  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  If cleared by medical oncologist to be on anticoagulation patient will reach out to his outpatient cardiologist and restart oral anticoagulation.  If medical oncologist considers him to be high risk for bleeding given his cancer burden then implantable loop recorder could be considered to monitor for A-fib burden and to guide therapy/treatment.  Reemphasized importance of following through with cardiology as outpatient. In the interim the patient is aware to be cognizant of strokelike symptoms and if present to go to the closest ER via EMS.  GIST tumor: Recommend that he follows up with his medical oncologist and updates them with regards to this hospitalization and discuss oral anticoagulation.   Further recommendations to follow as the case evolves.   This note was created using a voice recognition software as a result there may be grammatical errors inadvertently enclosed that do not reflect the nature of this encounter. Every attempt is made to correct such errors.  Total discharge time: 40 minutes   Tessa Lerner, DO, [facility]  [facility]  [address] [place], [place] [address] Pager: [phone] Office: [phone])  [phone] 11/21/2023 1:25 PM

## 2023-11-21 NOTE — Interval H&P Note (Signed)
 History and Physical Interval Note:  11/21/2023 8:38 AM  Jacob Perez [place] Rennaker  has presented today for surgery, with the diagnosis of unstable angina.  The various methods of treatment have been discussed with the patient and family. After consideration of risks, benefits and other options for treatment, the patient has consented to  Procedure(s): LEFT HEART CATH AND CORONARY ANGIOGRAPHY (N/A) as a surgical intervention.  The patient's history has been reviewed, patient examined, no change in status, stable for surgery.  I have reviewed the patient's chart and labs.  Questions were answered to the patient's satisfaction.    Cath Lab Visit (complete for each Cath Lab visit)  Clinical Evaluation Leading to the Procedure:   ACS: Yes.    Non-ACS:    Anginal Classification: CCS IV  Anti-ischemic medical therapy: Maximal Therapy (2 or more classes of medications)  Non-Invasive Test Results: No non-invasive testing performed  Prior CABG: No previous CABG       Theron Arista [facility] 11/21/2023 8:39 AM

## 2023-11-21 NOTE — Progress Notes (Signed)
 Patient radial site re-examined due to small amount  of oozing, which has stopped with pressure. No hematoma or bleeding. No motor or sensory loss. Post cath site care reviewed. He states his oncologist is OK with him taking DOAC, Dr Odis Hollingshead recommended Eliquis 5mg  BID starting tomorrow, this has been started, script sent to [facility] per patient request because he does not wish to wait longer for TOC to deliver.

## 2023-11-21 NOTE — TOC Transition Note (Signed)
 Transition of Care [facility]) - Discharge Note   Patient Details  Name: VIVIANO BIR MRN: [id number] Date of Birth: 02-18-1951  [facility] CM/SW Contact:  Harriet Masson, RN Phone Number: 11/21/2023, 12:56 PM   Clinical Narrative:    Patient stable to discharge home.  No TOC needs at this time.    Final next level of care: Home/Self Care Barriers to Discharge: Barriers Resolved   Patient Goals and CMS Choice Patient states their goals for this hospitalization and ongoing recovery are:: return home          Discharge Placement             Home          Discharge Plan and Services Additional resources added to the After Visit Summary for                                       Social Drivers of Health (SDOH) Interventions SDOH Screenings   Food Insecurity: No Food Insecurity (11/19/2023)  Housing: Low Risk  (11/19/2023)  Transportation Needs: No Transportation Needs (11/19/2023)  Utilities: Not At Risk (11/19/2023)  Physical Activity: Unknown (06/27/2018)   Received from [facility], [facility]  Social Connections: Moderately Isolated (11/19/2023)  Stress: No Stress Concern Present (06/27/2018)   Received from [facility], [facility]  Tobacco Use: High Risk (11/18/2023)     Readmission Risk Interventions    11/21/2023   12:56 PM  Readmission Risk Prevention Plan  Post Dischage Appt Complete  Medication Screening Complete  Transportation Screening Complete

## 2023-11-21 NOTE — Plan of Care (Signed)

## 2023-11-21 NOTE — Progress Notes (Signed)
 Brief rounding note:  Patient seen and examined at bedside. Accompanied by his wife. Status post heart catheterization-noted to have moderate CAD as well as coronary spasm. Denies anginal chest pain. Endorses abdominal pain and requesting his cancer medications to be restarted as well as pain medications.  Temp:  [97.6 F (36.4 C)-98.5 F (36.9 C)] 97.6 F (36.4 C) (04/07 1143) Pulse Rate:  [81-95] 92 (04/07 1143) Cardiac Rhythm: Normal sinus rhythm (04/07 0827) Resp:  [11-20] 19 (04/07 1143) BP: (117-154)/(65-90) 136/89 (04/07 1143) SpO2:  [91 %-100 %] 98 % (04/07 1143) Weight:  [74.1 kg] 74.1 kg (04/07 0525)  LABORATORY DATA:    Latest Ref Rng & Units 11/21/2023   10:39 AM 11/21/2023    2:16 AM 11/20/2023    2:21 AM  CBC  WBC 4.0 - 10.5 K/uL 16.4  16.7  6.8   Hemoglobin 13.0 - 17.0 g/dL 16.1  09.6  04.5   Hematocrit 39.0 - 52.0 % 38.2  40.0  42.2   Platelets 150 - 400 K/uL 247  264  259        Latest Ref Rng & Units 11/21/2023   10:39 AM 11/19/2023    3:30 AM 11/18/2023    7:28 PM  CMP  Glucose 70 - 99 mg/dL  95  97   BUN 8 - 23 mg/dL  16  16   Creatinine 4.09 - 1.24 mg/dL 8.11  9.14  7.82   Sodium 135 - 145 mmol/L  137  138   Potassium 3.5 - 5.1 mmol/L  4.4  3.9   Chloride 98 - 111 mmol/L  106  105   CO2 22 - 32 mmol/L  22  24   Calcium 8.9 - 10.3 mg/dL  9.3  9.4     Lipid Panel     Component Value Date/Time   CHOL 197 11/19/2023 0356   TRIG 104 11/19/2023 0356   HDL 34 (L) 11/19/2023 0356   CHOLHDL 5.8 11/19/2023 0356   VLDL 21 11/19/2023 0356   LDLCALC 142 (H) 11/19/2023 0356    No components found for: "NTPROBNP" No results for input(s): "PROBNP" in the last 8760 hours. Recent Labs    11/25/22 0944 11/19/23 0356  TSH 2.885 3.314    BMP Recent Labs    11/01/23 0847 11/18/23 1928 11/19/23 0330 11/21/23 1039  NA 142 138 137  --   K 3.9 3.9 4.4  --   CL 107 105 106  --   CO2 24 24 22   --   GLUCOSE 108* 97 95  --   BUN 15 16 16   --   CREATININE  1.04 1.30* 1.11 1.06  CALCIUM 9.5 9.4 9.3  --   GFRNONAA >60 59* >60 >60    HEMOGLOBIN A1C Lab Results  Component Value Date   HGBA1C 5.8 (H) 11/19/2023   MPG 120 11/19/2023    Cardiac Panel (last 3 results) Recent Labs    11/18/23 1928 11/18/23 2220 11/19/23 0330  TROPONINIHS 53* 176* 59*    CHOLESTEROL Lab Results  Component Value Date   CHOL 197 11/19/2023   HDL 34 (L) 11/19/2023   LDLCALC 142 (H) 11/19/2023   TRIG 104 11/19/2023   CHOLHDL 5.8 11/19/2023    Hepatic Function Panel Recent Labs    12/23/22 1101 01/19/23 0000 05/17/23 1037 07/12/23 0825 11/01/23 0847  PROT 7.2  --   --  7.7 6.9  ALBUMIN 3.9   < > 3.9 4.4 4.1  AST 18   < >  24 16 18   ALT 20   < > 15 9 10   ALKPHOS 46   < > 44 46 45  BILITOT 0.5  --   --  0.4 0.4   < > = values in this interval not displayed.   Impression: Nonobstructive CAD/coronary artery vasospasm. NSTEMI History of congenital heart disease status post surgery 1990s Hypertension Hypertension Persistent atrial fibrillation, status post ablation 2020 GIST tumor  Recommendations: Nonobstructive CAD/coronary artery vasospasm. NSTEMI -Denies anginal chest pain. -Continue aspirin 81 mg p.o. daily. -Increase statin therapy to rosuvastatin 20 mg p.o. daily -Reemphasized the importance of secondary prevention with focus on improving her modifiable cardiovascular risk factors such as glycemic control, lipid management, blood pressure control,  smoking cessation. -Start isosorbide mononitrate.  Spoke to the patient and wife that being on isosorbide mononitrate he should not take erectile dysfunction medications which include but not limited to Viagra/sildenafil, Cialis/tadalafil, Levitra/vardenafil due to drug-drug interactions.  They both verbalized understanding. Will arrange outpatient follow-up  History of congenital heart disease status post surgery 1990s: No additional details noted at this time.  Will need antibiotic  prophylaxis prior to dental procedures.  Hypertension: Continue Imdur and Toprol-XL  Hyperlipidemia: LDL 142, increase rosuvastatin to 20 mg p.o. daily.  Recommend rechecking fasting lipids in 6 weeks to reevaluate.  Persistent atrial fibrillation, status post ablation 2020 Was on anticoagulation but discontinued due to CHA2DS2-VASc score, based on records available in Care Everywhere. His CHA2DS2-VASc score is now 22 (age, hypertension, aortic atherosclerosis) We spoke about restarting oral anticoagulation given his CHA2DS2-VASc score.  In addition, patient states that he feels that he is in and out of atrial fibrillation.  We discussed the risk, benefits, and alternatives to oral anticoagulation.  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  If cleared by medical oncologist to be on anticoagulation patient will reach out to his outpatient cardiologist and restart oral anticoagulation.  If medical oncologist considers him to be high risk for bleeding given his cancer burden then implantable loop recorder could be considered to monitor for A-fib burden and to guide therapy/treatment.  GIST tumor: Recommend that he follows up with his medical oncologist and updates them with regards to this hospitalization and discuss oral anticoagulation.   No charge for this progress note. These recommendation will be transferred to d/c summary for completeness.  Please see discharge summary.  Izen Petz, DO, FACC 12:20 PM

## 2023-11-22 MED FILL — Verapamil HCl IV Soln 2.5 MG/ML: INTRAVENOUS | Qty: 2 | Status: AC

## 2023-11-29 ENCOUNTER — Telehealth: Payer: Self-pay | Admitting: Emergency Medicine

## 2023-11-29 MED ORDER — ISOSORBIDE MONONITRATE ER 30 MG PO TB24: 15.0000 mg | ORAL_TABLET | Freq: Every day | ORAL | Status: DC

## 2023-11-29 NOTE — Telephone Encounter (Signed)
 RN called and spoke to patient. Informed patient of Dr Christophe Cram directions .  It  take half of Imdur  daily. Patient verbalized understanding.   Medication changed on medication list

## 2023-11-29 NOTE — Telephone Encounter (Signed)
 RN spoke to patient. He states since being released from the Hospital- he has not been able to get out of bed due having a debilitating headache. Patient states he has tried Tylenol with no success. Patient also stated he has Oxycodone 10 mg  for cancer pain - and the Oxycodone does not help either.  Patient states his blood pressure  range has been  115/50's- 120/50's   , heart rate 90's - 102's   no chest pain    Patient is aware RN will defer to provider who saw him in the hospital and upcoming providers he has an appointment with December 14, 2023

## 2023-11-29 NOTE — Telephone Encounter (Signed)
  I would have him reduce dose of Imdur to 15 mg daily. Usually HA will improve with time.  Peter Swaziland MD, [facility]

## 2023-11-29 NOTE — Telephone Encounter (Signed)
 Pt c/o medication issue:  1. Name of Medication: isosorbide mononitrate (IMDUR) 30 MG 24 hr tablet   2. How are you currently taking this medication (dosage and times per day)?    3. Are you having a reaction (difficulty breathing--STAT)? no  4. What is your medication issue? Patient thinks this medication is causing him to have headaches. Please advise

## 2023-12-12 ENCOUNTER — Ambulatory Visit: Admitting: Nurse Practitioner

## 2023-12-14 ENCOUNTER — Ambulatory Visit: Attending: Emergency Medicine | Admitting: Emergency Medicine

## 2023-12-14 ENCOUNTER — Encounter: Payer: Self-pay | Admitting: Emergency Medicine

## 2023-12-14 VITALS — BP 128/80 | HR 76 | Ht 72.0 in | Wt 169.0 lb

## 2023-12-14 DIAGNOSIS — E782 Mixed hyperlipidemia: Secondary | ICD-10-CM | POA: Diagnosis not present

## 2023-12-14 DIAGNOSIS — I201 Angina pectoris with documented spasm: Secondary | ICD-10-CM

## 2023-12-14 DIAGNOSIS — I25111 Atherosclerotic heart disease of native coronary artery with angina pectoris with documented spasm: Secondary | ICD-10-CM | POA: Diagnosis not present

## 2023-12-14 DIAGNOSIS — I214 Non-ST elevation (NSTEMI) myocardial infarction: Secondary | ICD-10-CM

## 2023-12-14 DIAGNOSIS — C49A4 Gastrointestinal stromal tumor of large intestine: Secondary | ICD-10-CM

## 2023-12-14 DIAGNOSIS — I4819 Other persistent atrial fibrillation: Secondary | ICD-10-CM

## 2023-12-14 DIAGNOSIS — I10 Essential (primary) hypertension: Secondary | ICD-10-CM

## 2023-12-14 DIAGNOSIS — Q249 Congenital malformation of heart, unspecified: Secondary | ICD-10-CM | POA: Diagnosis not present

## 2023-12-14 LAB — CBC

## 2023-12-14 NOTE — Patient Instructions (Addendum)
 Medication Instructions:  OK TO STOP ISOSORBIDE  MONONITRATE. CONTINUE WITH ALL OTHER CURRENT MEDICATION REGIMEN.   Lab Work: Nutritional therapist AND CBC TO BE DONE TODAY.   Testing/Procedures: NONE  Follow-Up: At [facility], you and your health needs are our priority.  As part of our continuing mission to provide you with exceptional heart care, our providers are all part of one team.  This team includes your primary Cardiologist (physician) and Advanced Practice Providers or APPs (Physician Assistants and Nurse Practitioners) who all work together to provide you with the care you need, when you need it.  Your next appointment:   3 month(s)  Provider:   MADISON FOUNTAIN, DNP

## 2023-12-14 NOTE — Progress Notes (Signed)
 Cardiology Office Note:    Date:  12/14/2023  ID:  [facility] Jacob, Perez [birth year], MRN [id number] PCP: Aloha Arnold, PA-C  [facility] Providers Cardiologist:  Olinda Bertrand, DO       Patient Profile:      Chief Complaint: Hospital follow-up for NSTEMI History of Present Illness:  Jacob Perez is a 73 y.o. male with visit-pertinent history of metastatic GIST tumor, COPD and active smoker, prior congenital heart defect repaired in 1990s, coronary artery disease, NSTEMI, hypertension, hyperlipidemia, persistent atrial fibrillation s/p ablation in 2020,  Patient was seen in the hospital on 11/19/2023 for the evaluation of NSTEMI.  He had presented with chest pain.  He noted that he had been having chest pain on and off for the last several weeks.  He was doing okay on 11/19/2023 and went with his brother fishing and had to leave early to the emergency department due to severe chest pain.  Troponins went from 50s to 170s.  He was started on nitroglycerin  infusion and eventually chest pain subsided.  LHC showed nonobstructive CAD.  There appeared to be moderate disease in the LCx and RCA that improved with IC NTG suggesting a component of vasospasm.  Normal flow analysis of both arteries.  Normal LVEDP.  Echocardiogram showed 50-55%, low normal function, no RWMA, grade 1 DD, normal RV.  He had history of persistent atrial fibrillation however was not on OAC.  Dr. Albert Huff I recommended DOAC for CHA2DS2-VASc of 3.  He was started on Eliquis  5 mg twice daily.  He was given the okay from his oncologist to begin on DOAC.  Imdur  30 mg daily was also added.  PTA metoprolol  XL 25 mg and Crestor  20 mg were continued.   Of note he has been treated actively for metastatic GI stromal tumor. Per his report and recent oncology notes this has been stable. He also has COPD and is an active smoker. Smokes about 1 pack/day.  Also noted per chart review from [facility] patient has history of atrial fibrillation ablation in  2020 and flecainide use, no reoccurrence so far, off flecainide and Xarelto for some time and his cardiologist at [facility] were okay with it.  He does not specifically recall why he was taken off of OAC and states he never had any major bleeding in the past.   Discussed the use of AI scribe software for clinical note transcription with the patient, who gave verbal consent to proceed.  History of Present Illness Jacob Perez is a 73 year old male with coronary artery disease and atrial fibrillation who presents for follow-up after recent hospitalization for chest pain.  Today patient reports that he is doing well overall.  He is without any acute cardiovascular concerns or complaints.  He is no longer having chest pain or anginal equivalent.  Notes that he is doing "well".  He reports that he had to self discontinue his Imdur  as it has caused him severe migraines.  He denies any other medication side effects.  He experiences occasional weakness, which he attributes to cancer medication. He has reduced physical activity but engages in hobbies like training hunting dogs and fishing. No major symptoms such as swelling, abdominal pain, or bleeding. Occasional heartburn-like symptoms resolve spontaneously.  He denies chest pain, shortness of breath, lower extremity edema, palpitations, melena, hematuria, hemoptysis, diaphoresis, weakness, presyncope, syncope, orthopnea, and PND.  Review of systems:  Please see the history of present illness. All other systems are reviewed and otherwise  negative.     Home Medications:    Current Meds  Medication Sig   albuterol  (PROVENTIL ) (2.5 MG/3ML) 0.083% nebulizer solution Take 2.5 mg by nebulization every 6 (six) hours as needed for wheezing or shortness of breath.   albuterol  (VENTOLIN  HFA) 108 (90 Base) MCG/ACT inhaler Inhale 2 puffs into the lungs every 4 (four) hours as needed for wheezing or shortness of breath.   apixaban  (ELIQUIS ) 5 MG TABS tablet Take  1 tablet (5 mg total) by mouth 2 (two) times daily.   aspirin  EC 81 MG tablet Take 1 tablet (81 mg total) by mouth daily. Swallow whole.   gabapentin  (NEURONTIN ) 300 MG capsule Take 300 mg by mouth daily as needed (for breakthrough pain).   metoprolol  succinate (TOPROL -XL) 25 MG 24 hr tablet Take 1 tablet (25 mg total) by mouth daily.   nitroGLYCERIN  (NITROSTAT ) 0.4 MG SL tablet Place 1 tablet (0.4 mg total) under the tongue every 5 (five) minutes as needed for chest pain.   Oxycodone  HCl 10 MG TABS Take 1 tablet (10 mg total) by mouth every 4 (four) hours as needed. 1 tab po q6h prn   predniSONE  (DELTASONE ) 20 MG tablet Take 1 tablet (20 mg total) by mouth daily with breakfast. TAKE FOR APPETITE STIMULATION   regorafenib  (STIVARGA ) 40 MG tablet Take 2 tablets (80 mg total) by mouth daily with breakfast. Take with low fat meal. For 21 days, then 7 days off, every 28 days. Caution: Chemotherapy.   rosuvastatin  (CRESTOR ) 20 MG tablet Take 1 tablet (20 mg total) by mouth at bedtime.   sulfamethoxazole-trimethoprim (BACTRIM DS) 800-160 MG tablet Take 1 tablet by mouth 2 (two) times daily.   SYMBICORT 160-4.5 MCG/ACT inhaler SMARTSIG:2 Puff(s) By Mouth Twice Daily   Vitamin D-Vitamin K (VITAMIN D2 + K1 PO) Take 2 tablets by mouth daily.   [DISCONTINUED] isosorbide  mononitrate (IMDUR ) 30 MG 24 hr tablet Take 0.5 tablets (15 mg total) by mouth daily.   Studies Reviewed:   EKG Interpretation Date/Time:  Wednesday December 14 2023 08:33:38 EDT Ventricular Rate:  73 PR Interval:  154 QRS Duration:  102 QT Interval:  400 QTC Calculation: 440 R Axis:   -17  Text Interpretation: Normal sinus rhythm Minimal voltage criteria for LVH, may be normal variant ( Cornell product ) ST & T wave abnormality, consider inferior ischemia When compared with ECG of 21-Nov-2023 09:59, Premature ventricular complexes are no longer Present Inverted T waves have replaced nonspecific T wave abnormality in Inferior leads  Nonspecific T wave abnormality, improved in Lateral leads Confirmed by Palmer Bobo [phone]) on 12/14/2023 5:20:29 PM    Echocardiogram 11/20/2023 1. Left ventricular ejection fraction, by estimation, is 50 to 55%. The  left ventricle has low normal function. The left ventricle has no regional  wall motion abnormalities. Left ventricular diastolic parameters are  consistent with Grade I diastolic  dysfunction (impaired relaxation).   2. Right ventricular systolic function is normal. The right ventricular  size is normal.   3. The mitral valve is normal in structure. No evidence of mitral valve  regurgitation. No evidence of mitral stenosis.   4. The aortic valve is normal in structure. Aortic valve regurgitation is  not visualized. No aortic stenosis is present.   5. The inferior vena cava is normal in size with greater than 50%  respiratory variability, suggesting right atrial pressure of 3 mmHg.   Cardiac catheterization 11/21/2023 Nonobstructive CAD. There appeared to be moderate disease in the LCx and RCA that  improved with IC Ntg suggesting a component of vasospasm. Normal flow analysis of both arteries Normal LVEDP Diagnostic Dominance: Right  Risk Assessment/Calculations:    CHA2DS2-VASc Score = 3   This indicates a 3.2% annual risk of stroke. The patient's score is based upon: CHF History: 0 HTN History: 1 Diabetes History: 0 Stroke History: 0 Vascular Disease History: 1 Age Score: 1 Gender Score: 0            Physical Exam:   VS:  BP 128/80 (BP Location: Right Arm, Patient Position: Sitting, Cuff Size: Normal)   Pulse 76   Ht 6' (1.829 m)   Wt 169 lb (76.7 kg)   SpO2 95%   BMI 22.92 kg/m    Wt Readings from Last 3 Encounters:  12/14/23 169 lb (76.7 kg)  11/21/23 163 lb 6.4 oz (74.1 kg)  11/01/23 175 lb 9.6 oz (79.7 kg)    GEN: Well nourished, well developed in no acute distress NECK: No JVD; No carotid bruits CARDIAC: RRR, no murmurs, rubs,  gallops RESPIRATORY:  Clear to auscultation without rales, wheezing or rhonchi  ABDOMEN: Soft, non-tender, non-distended EXTREMITIES:  No edema; No acute deformity     Assessment and Plan:  NSTEMI /Coronary artery disease / Coronary vasospasm LHC 4/7 showed nonobstructive CAD with moderate disease in LCx and RCA that improved with IC NTG suggesting component of vasospasm.  There was normal flow analysis of both arteries and normal LVEDP Echocardiogram 4/6 with LVEF 50-55%, no RWMA, grade 1 DD - Today patient is without any anginal symptoms, no indication for further ischemic evaluation at this time - EKG today shows no acute ischemic changes - Right radial cath site healing appropriately without evidence of bruit, hematoma, swelling - Imdur  recently self discontinued due to migraine.  Offered patient treatment with amlodipine  to treat coronary vasospasm however patient politely deferred and prefers not to add any more medications at this time - Continue metoprolol  XL 25 mg daily, rosuvastatin  20 mg daily, aspirin  81 mg daily, nitroglycerin  as needed - BMET and CBC today   History of congenital heart disease status post surgery 1990s - No additional details noted at this time.  Will need antibiotic prophylaxis prior to dental procedures.   Hypertension Blood pressure today is 128/80 and under adequate control - Continue metoprolol  XL 25 mg daily  Hyperlipidemia Lipoprotein (a) 29.7 on 12/2023 LDL 142, HDL 34, TG 104, TC 197 on 08/6107 LDL is currently uncontrolled goal of less than 70 His rosuvastatin  was just recently increased to 20 mg daily during admission - Plan to repeat fasting lipid panel at follow-up visit in 12 weeks - Continue rosuvastatin  20 mg daily  Persistent atrial fibrillation S/p ablation 2020 - EKG today shows patient is maintaining sinus rhythm - He denies any symptoms concerning for recurrent atrial fibrillation since discharge home.   - Previously he had noted  he was feeling like he was going in and out of atrial fibrillation prior to his recent admission. - He was cleared by his medical oncologist to be on anticoagulation and recently started on Eliquis  during his recent admission - Continue Eliquis  5 mg twice daily, no bleeding concerns  GIST tumor - Managed by oncology      Dispo:  Return in about 3 months (around 03/14/2024).  Signed, Ava Boatman, NP

## 2023-12-15 LAB — BASIC METABOLIC PANEL WITH GFR
BUN/Creatinine Ratio: 12 (ref 10–24)
BUN: 11 mg/dL (ref 8–27)
CO2: 23 mmol/L (ref 20–29)
Calcium: 9.4 mg/dL (ref 8.6–10.2)
Chloride: 103 mmol/L (ref 96–106)
Creatinine, Ser: 0.93 mg/dL (ref 0.76–1.27)
Glucose: 86 mg/dL (ref 70–99)
Potassium: 5 mmol/L (ref 3.5–5.2)
Sodium: 141 mmol/L (ref 134–144)
eGFR: 88 mL/min/{1.73_m2} (ref >59)

## 2023-12-15 LAB — CBC
Hematocrit: 40.6 % (ref 37.5–51.0)
Hemoglobin: 13.4 g/dL (ref 13.0–17.7)
MCH: 30.9 pg (ref 26.6–33.0)
MCHC: 33 g/dL (ref 31.5–35.7)
MCV: 94 fL (ref 79–97)
Platelets: 435 10*3/uL (ref 150–450)
RBC: 4.34 x10E6/uL (ref 4.14–5.80)
RDW: 14.3 % (ref 11.6–15.4)
WBC: 12.6 10*3/uL — ABNORMAL HIGH (ref 3.4–10.8)

## 2023-12-20 ENCOUNTER — Other Ambulatory Visit (HOSPITAL_COMMUNITY): Payer: Self-pay

## 2023-12-22 ENCOUNTER — Ambulatory Visit (HOSPITAL_BASED_OUTPATIENT_CLINIC_OR_DEPARTMENT_OTHER)
Admission: RE | Admit: 2023-12-22 | Discharge: 2023-12-22 | Disposition: A | Discharge status: Home / Self Care | Source: Ambulatory Visit | Attending: Oncology | Admitting: Oncology

## 2023-12-22 ENCOUNTER — Other Ambulatory Visit: Payer: Self-pay | Admitting: Hematology and Oncology

## 2023-12-22 DIAGNOSIS — C49A0 Gastrointestinal stromal tumor, unspecified site: Secondary | ICD-10-CM

## 2023-12-22 MED ORDER — PREDNISONE 50 MG PO TABS: ORAL_TABLET | ORAL | 0 refills | Status: DC

## 2023-12-23 ENCOUNTER — Inpatient Hospital Stay: Admitting: Oncology

## 2023-12-23 ENCOUNTER — Inpatient Hospital Stay

## 2023-12-26 ENCOUNTER — Other Ambulatory Visit (HOSPITAL_COMMUNITY): Payer: Self-pay

## 2023-12-26 ENCOUNTER — Other Ambulatory Visit: Payer: Self-pay | Admitting: Hematology and Oncology

## 2023-12-26 MED ORDER — OXYCODONE HCL 10 MG PO TABS: 10.0000 mg | ORAL_TABLET | ORAL | 0 refills | Status: DC | PRN

## 2023-12-27 ENCOUNTER — Telehealth: Payer: Self-pay | Admitting: Cardiology

## 2023-12-27 ENCOUNTER — Ambulatory Visit: Admitting: Oncology

## 2023-12-27 ENCOUNTER — Other Ambulatory Visit

## 2023-12-27 DIAGNOSIS — I4819 Other persistent atrial fibrillation: Secondary | ICD-10-CM

## 2023-12-27 NOTE — Telephone Encounter (Signed)
 Spoke with patient and he states eliquis  is costing $150 and he will not be able to afford that. He would like to know if there is a cheaper option but not coumadin.

## 2023-12-27 NOTE — Telephone Encounter (Signed)
 Pt c/o medication issue:  1. Name of Medication: apixaban  (ELIQUIS ) 5 MG TABS tablet   2. How are you currently taking this medication (dosage and times per day)?    3. Are you having a reaction (difficulty breathing--STAT)? no  4. What is your medication issue? Patient states that the medication is too expensive. Please advise

## 2023-12-28 NOTE — Telephone Encounter (Signed)
 Dabigatran would be slightly cheaper with a good rx coupon (not through his insurance). But given his GI cancer, I do not think dabigatran would be a good option for him.  Therefore that leaves us  with warfarin.

## 2023-12-29 ENCOUNTER — Ambulatory Visit (INDEPENDENT_AMBULATORY_CARE_PROVIDER_SITE_OTHER)
Admission: RE | Admit: 2023-12-29 | Discharge: 2023-12-29 | Disposition: A | Discharge status: Home / Self Care | Source: Ambulatory Visit | Attending: Oncology | Admitting: Oncology

## 2023-12-29 DIAGNOSIS — C49A0 Gastrointestinal stromal tumor, unspecified site: Secondary | ICD-10-CM | POA: Diagnosis not present

## 2023-12-29 MED ORDER — IOHEXOL 300 MG/ML  SOLN
100.0000 mL | Freq: Once | INTRAMUSCULAR | Status: AC | PRN
  Administered 2023-12-29: 100 mL via INTRAVENOUS

## 2024-01-01 NOTE — Progress Notes (Signed)
 [facility]  [address] [place],  [place]  [address] [phone]  Clinic Day:  01/02/2024  Referring physician: Aloha Arnold, PA-C  HISTORY OF PRESENT ILLNESS:  The patient is a 73 y.o. male with metastatic gastrointestinal stromal tumor, including multiple tumor deposits throughout his abdomen.  He comes in today to go over his CT scans to ascertain his new disease baseline after 18 cycles of regorafenib , which is being given at 80 mg daily, every 3 out of 4 weeks.  The patient has tolerated his past 2 cycles of regorafenib  fairly well.  Fatigue remains an issue, but it has not been as prominent as it has been previously.  He does have intermittent abdominal pain at the sites of where his cancer deposits are located.  However, he denies having any new symptoms or findings which concern him for overt signs of disease progression.  With respect to this gentleman's metastatic GIST, he was placed on imatinib in August 2021.  His disease was kept under control with this agent until disease progression was seen on scans in August 2023.  This led to him being switched to sunitinib.  However, after just 2 cycles of sunitinib, scans showed disease progression to where he was switched to regorafenib , for which he continues to take.    PHYSICAL EXAM:  Blood pressure (!) 152/89, pulse 80, temperature 98.1 F (36.7 C), temperature source Oral, resp. rate 16, height 6' (1.829 m), weight 169 lb (76.7 kg), SpO2 100%. Wt Readings from Last 3 Encounters:  01/02/24 169 lb (76.7 kg)  12/14/23 169 lb (76.7 kg)  11/21/23 163 lb 6.4 oz (74.1 kg)   Body mass index is 22.92 kg/m. Performance status (ECOG): 1 - Symptomatic but completely ambulatory Physical Exam Constitutional:      Appearance: Normal appearance. He is not ill-appearing.  HENT:     Mouth/Throat:     Mouth: Mucous membranes are moist.     Pharynx: Oropharynx is clear. No oropharyngeal exudate or posterior  oropharyngeal erythema.  Cardiovascular:     Rate and Rhythm: Normal rate and regular rhythm.     Heart sounds: No murmur heard.    No friction rub. No gallop.  Pulmonary:     Effort: Pulmonary effort is normal. No respiratory distress.     Breath sounds: Normal breath sounds. No wheezing, rhonchi or rales.  Abdominal:     General: Bowel sounds are normal. There is no distension.     Palpations: Abdomen is soft. There is no mass.     Tenderness: There is no abdominal tenderness.  Musculoskeletal:        General: No swelling.     Right lower leg: No edema.     Left lower leg: No edema.  Lymphadenopathy:     Cervical: No cervical adenopathy.     Upper Body:     Right upper body: No supraclavicular or axillary adenopathy.     Left upper body: No supraclavicular or axillary adenopathy.     Lower Body: No right inguinal adenopathy. No left inguinal adenopathy.  Skin:    General: Skin is warm.     Coloration: Skin is not jaundiced.     Findings: No lesion or rash.  Neurological:     General: No focal deficit present.     Mental Status: He is alert and oriented to person, place, and time. Mental status is at baseline.  Psychiatric:        Mood and Affect:  Mood normal.        Behavior: Behavior normal.        Thought Content: Thought content normal.   SCANS:  CT scans of her abdomen/pelvis revealed the following:  FINDINGS:  There are subsegmental atelectatic changes in the lung bases. There is pulmonary emphysema. Stable 13 mm left lower lobe pulmonary nodule.  The heart is normal in size. The liver appears normal. The gallbladder is normal. The spleen is normal. The pancreas is normal. The adrenals are normal. The kidneys contain small cysts. The abdominal aorta is normal in caliber. Scattered atherosclerotic changes are present. The bladder is normal. The prostate is normal. Similar 6.2 x 5.5 cm mass associated with the ilium (series 301 image 47). Additional mass associated  with the ileum appears slightly smaller now measuring 6.0 x 3.5 cm (prior 6.7 x 3.8 cm when measured in similar fashion on prior) (series 301 image 69).  No free fluid or adenopathy. There are degenerative changes of the spine.  IMPRESSION:  1 mass associated with the ileum appears similar and the other appears slightly smaller compared to the prior examination.  Stable 13 mm left lower lobe pulmonary nodule.   ASSESSMENT & PLAN:  Assessment/Plan:  A 73 y.o. male with metastatic gastrointestinal stromal tumor (GIST).  In clinic today, I went over all of his CT scan images with him, for which I do not see any evidence of disease progression.  In fact, the lesions in his abdomen appear to be slightly smaller.  Based upon this, he will proceed with his 19th cycle of regorafenib , which she began last week.  Clinically, he appears to be doing fairly well.  I will see this patient back in 8 weeks before he heads into a potential 21st cycle of treatment.  Of note, due to a decreased appetite, Megace  800 mg will be represcribed for appetite stimulation.  The patient understands all the plans discussed today and is in agreement with them.    Naven Giambalvo Felicia Horde, MD

## 2024-01-02 ENCOUNTER — Other Ambulatory Visit: Payer: Self-pay

## 2024-01-02 ENCOUNTER — Inpatient Hospital Stay: Attending: Oncology

## 2024-01-02 ENCOUNTER — Other Ambulatory Visit: Payer: Self-pay | Admitting: Oncology

## 2024-01-02 ENCOUNTER — Inpatient Hospital Stay: Admitting: Oncology

## 2024-01-02 VITALS — BP 152/89 | HR 80 | Temp 98.1°F | Resp 16 | Ht 72.0 in | Wt 169.0 lb

## 2024-01-02 DIAGNOSIS — C49A0 Gastrointestinal stromal tumor, unspecified site: Secondary | ICD-10-CM | POA: Diagnosis present

## 2024-01-02 DIAGNOSIS — R911 Solitary pulmonary nodule: Secondary | ICD-10-CM | POA: Insufficient documentation

## 2024-01-02 DIAGNOSIS — C481 Malignant neoplasm of specified parts of peritoneum: Secondary | ICD-10-CM

## 2024-01-02 LAB — CMP (CANCER CENTER ONLY)
ALT: 11 U/L (ref 0–44)
AST: 17 U/L (ref 15–41)
Albumin: 4 g/dL (ref 3.5–5.0)
Alkaline Phosphatase: 64 U/L (ref 38–126)
Anion gap: 13 (ref 5–15)
BUN: 17 mg/dL (ref 8–23)
CO2: 22 mmol/L (ref 22–32)
Calcium: 9.7 mg/dL (ref 8.9–10.3)
Chloride: 103 mmol/L (ref 98–111)
Creatinine: 1.02 mg/dL (ref 0.61–1.24)
GFR, Estimated: >60 mL/min (ref >60)
Glucose, Bld: 87 mg/dL (ref 70–99)
Potassium: 4.4 mmol/L (ref 3.5–5.1)
Sodium: 139 mmol/L (ref 135–145)
Total Bilirubin: 0.5 mg/dL (ref 0.0–1.2)
Total Protein: 7.5 g/dL (ref 6.5–8.1)

## 2024-01-02 LAB — CBC WITH DIFFERENTIAL (CANCER CENTER ONLY)
Abs Immature Granulocytes: 0.04 10*3/uL (ref 0.00–0.07)
Basophils Absolute: 0.1 10*3/uL (ref 0.0–0.1)
Basophils Relative: 1 %
Eosinophils Absolute: 0.5 10*3/uL (ref 0.0–0.5)
Eosinophils Relative: 3 %
HCT: 42.4 % (ref 39.0–52.0)
Hemoglobin: 14.8 g/dL (ref 13.0–17.0)
Immature Granulocytes: 0 %
Lymphocytes Relative: 34 %
Lymphs Abs: 4.7 10*3/uL — ABNORMAL HIGH (ref 0.7–4.0)
MCH: 30.7 pg (ref 26.0–34.0)
MCHC: 34.9 g/dL (ref 30.0–36.0)
MCV: 88 fL (ref 80.0–100.0)
Monocytes Absolute: 1 10*3/uL (ref 0.1–1.0)
Monocytes Relative: 7 %
Neutro Abs: 7.7 10*3/uL (ref 1.7–7.7)
Neutrophils Relative %: 55 %
Platelet Count: 313 10*3/uL (ref 150–400)
RBC: 4.82 MIL/uL (ref 4.22–5.81)
RDW: 14.7 % (ref 11.5–15.5)
WBC Count: 13.9 10*3/uL — ABNORMAL HIGH (ref 4.0–10.5)
nRBC: 0 % (ref 0.0–0.2)

## 2024-01-02 MED ORDER — MEGESTROL ACETATE 400 MG/10ML PO SUSP: 800.0000 mg | Freq: Every day | ORAL | 0 refills | Status: DC

## 2024-01-02 MED ORDER — APIXABAN 5 MG PO TABS: 5.0000 mg | ORAL_TABLET | Freq: Two times a day (BID) | ORAL | 0 refills | Status: DC

## 2024-01-02 NOTE — Progress Notes (Signed)
 Eliquis  5 mg samples given for 2 weeks + 30 day trial card.

## 2024-01-02 NOTE — Telephone Encounter (Signed)
 Yes, refer him to coumadin clinic.   Lafern Brinkley [place], DO, FACC

## 2024-01-02 NOTE — Telephone Encounter (Signed)
 Spoke with pt and explained that for him, the only other option other than Eliquis  is Coumadin. Pt stated he has been on Coumadin before and doesn't really want to be on it again. Explained to pt that he needs to be on a blood thinner with his hx of afib and that we don't have many options available. Explained that we do have pt assistance forms for Eliquis  and pt stated he was given these forms recently to fill out. Advised pt to get these forms filled out ASAP and returned to our office to fax to our pt assistance team and we can give him Eliquis  samples for 2 weeks plus a 30 day trial card. Pt agreed to this plan. Referral to Coumadin clinic was still placed just in case.

## 2024-01-02 NOTE — Progress Notes (Signed)
 Patient recently had skin cancer removed from left side of face

## 2024-01-03 ENCOUNTER — Telehealth: Payer: Self-pay | Admitting: Emergency Medicine

## 2024-01-03 NOTE — Telephone Encounter (Signed)
 Patient dropped by medication assistance forms for your review and sign off.  I am placing this paperwork in Madison's mail box around lunch time today.  Thank you.

## 2024-01-04 ENCOUNTER — Telehealth: Payer: Self-pay | Admitting: *Deleted

## 2024-01-04 NOTE — Telephone Encounter (Signed)
 Spoke with pt after receiving an enrollment for the pt regarding starting warfarin. After reviewing last note on 12/27/23 noted that pt was not interested in warfarin as he has taken in the past, so called to confirm. Pt states he does not want to take warfarin and is staying on eliquis  at this time and has been given samples and has forms to fill out for assistance. He is aware that if this is needed in the future they will send another enrollment and we will follow up at that time.   Removing anticoagulation enrollment at this time another enrollment will need to be sent once he is agreeable to starting warfarin.

## 2024-01-05 ENCOUNTER — Encounter: Payer: Self-pay | Admitting: Pharmacist

## 2024-01-12 ENCOUNTER — Telehealth: Payer: Self-pay | Admitting: Pharmacy Technician

## 2024-01-12 ENCOUNTER — Other Ambulatory Visit: Payer: Self-pay | Admitting: Hematology and Oncology

## 2024-01-12 ENCOUNTER — Other Ambulatory Visit: Payer: Self-pay

## 2024-01-12 DIAGNOSIS — C49A0 Gastrointestinal stromal tumor, unspecified site: Secondary | ICD-10-CM

## 2024-01-12 MED ORDER — REGORAFENIB 40 MG PO TABS: 80.0000 mg | ORAL_TABLET | Freq: Every day | ORAL | 3 refills | Status: DC

## 2024-01-12 NOTE — Telephone Encounter (Signed)
 Oral Oncology Patient Advocate Encounter  Received a fax from [facility]  Patient [facility] requesting a new prescription. Can you send this to [facility]?  Letter is indexed to pt's media tab in case you need it.   Roda Cirri, CPhT Specialty Pharmacy Patient Advocate Phone: [phone] Fax: [phone]

## 2024-02-15 ENCOUNTER — Telehealth: Payer: Self-pay | Admitting: Oncology

## 2024-02-15 NOTE — Telephone Encounter (Signed)
-----   Message from CMA Tammy B sent at 02/15/2024 10:17 AM EDT ----- Regarding: RE-SCHED APPT FOR 02-21-24 PT IS SCHEDULED FOR CATARACT SURGERY THIS DAY.  PLEASE CALL TO RE-SCHED.  THANKS

## 2024-02-15 NOTE — Telephone Encounter (Signed)
 Patient has been scheduled. Aware of appt date and time.

## 2024-02-21 ENCOUNTER — Other Ambulatory Visit

## 2024-02-21 ENCOUNTER — Ambulatory Visit: Admitting: Oncology

## 2024-02-22 NOTE — Progress Notes (Unsigned)
 [facility]  [address] [place],  [place]  [address] [phone]  Clinic Day:  02/23/2024  Referring physician: Montey Lot, PA-C  HISTORY OF PRESENT ILLNESS:  The patient is a 74 y.o. male with metastatic gastrointestinal stromal tumor, including multiple tumor deposits throughout his abdomen.  He comes in today to be evaluated for heading into his 21st cycle of regorafenib , which is being given at 80 mg daily, every 3 out of 4 weeks.  The patient has tolerated his past 2 cycles of regorafenib  fairly well.  Fatigue remains an issue, but it has not been as prominent as it has been previously.  However, he denies having any new symptoms or findings which concern him for overt signs of disease progression.  With respect to this gentleman's metastatic GIST, he was placed on imatinib in August 2021.  His disease was kept under control with this agent until disease progression was seen on scans in August 2023.  This led to him being switched to sunitinib.  However, after just 2 cycles of sunitinib, scans showed disease progression to where he was switched to regorafenib , for which he continues to take.    PHYSICAL EXAM:  Blood pressure (!) 148/90, pulse 70, temperature 98.2 F (36.8 C), temperature source Oral, resp. rate 20, height 6' (1.829 m), weight 167 lb 3.2 oz (75.8 kg), SpO2 99%. Wt Readings from Last 3 Encounters:  02/23/24 167 lb 3.2 oz (75.8 kg)  01/02/24 169 lb (76.7 kg)  12/14/23 169 lb (76.7 kg)   Body mass index is 22.68 kg/m. Performance status (ECOG): 1 - Symptomatic but completely ambulatory Physical Exam Constitutional:      Appearance: Normal appearance. He is not ill-appearing.  HENT:     Mouth/Throat:     Mouth: Mucous membranes are moist.     Pharynx: Oropharynx is clear. No oropharyngeal exudate or posterior oropharyngeal erythema.  Cardiovascular:     Rate and Rhythm: Normal rate and regular rhythm.     Heart sounds: No  murmur heard.    No friction rub. No gallop.  Pulmonary:     Effort: Pulmonary effort is normal. No respiratory distress.     Breath sounds: Normal breath sounds. No wheezing, rhonchi or rales.  Abdominal:     General: Bowel sounds are normal. There is no distension.     Palpations: Abdomen is soft. There is no mass.     Tenderness: There is no abdominal tenderness.  Musculoskeletal:        General: No swelling.     Right lower leg: No edema.     Left lower leg: No edema.  Lymphadenopathy:     Cervical: No cervical adenopathy.     Upper Body:     Right upper body: No supraclavicular or axillary adenopathy.     Left upper body: No supraclavicular or axillary adenopathy.     Lower Body: No right inguinal adenopathy. No left inguinal adenopathy.  Skin:    General: Skin is warm.     Coloration: Skin is not jaundiced.     Findings: No lesion or rash.  Neurological:     General: No focal deficit present.     Mental Status: He is alert and oriented to person, place, and time. Mental status is at baseline.  Psychiatric:        Mood and Affect: Mood normal.        Behavior: Behavior normal.        Thought Content:  Thought content normal.    ASSESSMENT & PLAN:  Assessment/Plan:  A 73 y.o. male with metastatic gastrointestinal stromal tumor (GIST).  The patient will proceed with his 21st cycle of regorafenib , which he actually started yesterday..  Clinically, he appears to be doing fairly well.  With respect to his fatigue, I will rewrite his prednisone  prescription, which has helped with his energy level and appetite.  Otherwise, I will see this patient back in 8 weeks before he heads into a potential 23rd cycle of treatment.  CT scans will be done a day before his next visit to ascertain his new disease baseline after 22 cycles of regorafenib .  The patient understands all the plans discussed today and is in agreement with them.    Makinzie Considine DELENA Kerns, MD

## 2024-02-23 ENCOUNTER — Other Ambulatory Visit: Payer: Self-pay | Admitting: Oncology

## 2024-02-23 ENCOUNTER — Telehealth: Payer: Self-pay | Admitting: Oncology

## 2024-02-23 ENCOUNTER — Other Ambulatory Visit: Payer: Self-pay

## 2024-02-23 ENCOUNTER — Inpatient Hospital Stay: Attending: Oncology

## 2024-02-23 ENCOUNTER — Inpatient Hospital Stay: Admitting: Oncology

## 2024-02-23 VITALS — BP 148/90 | HR 70 | Temp 98.2°F | Resp 20 | Ht 72.0 in | Wt 167.2 lb

## 2024-02-23 DIAGNOSIS — C49A0 Gastrointestinal stromal tumor, unspecified site: Secondary | ICD-10-CM

## 2024-02-23 DIAGNOSIS — C49A4 Gastrointestinal stromal tumor of large intestine: Secondary | ICD-10-CM

## 2024-02-23 LAB — CBC WITH DIFFERENTIAL (CANCER CENTER ONLY)
Abs Immature Granulocytes: 0.04 K/uL (ref 0.00–0.07)
Basophils Absolute: 0 K/uL (ref 0.0–0.1)
Basophils Relative: 0 %
Eosinophils Absolute: 0.4 K/uL (ref 0.0–0.5)
Eosinophils Relative: 4 %
HCT: 37.8 % — ABNORMAL LOW (ref 39.0–52.0)
Hemoglobin: 13.1 g/dL (ref 13.0–17.0)
Immature Granulocytes: 0 %
Lymphocytes Relative: 25 %
Lymphs Abs: 2.8 K/uL (ref 0.7–4.0)
MCH: 29.9 pg (ref 26.0–34.0)
MCHC: 34.7 g/dL (ref 30.0–36.0)
MCV: 86.3 fL (ref 80.0–100.0)
Monocytes Absolute: 0.7 K/uL (ref 0.1–1.0)
Monocytes Relative: 6 %
Neutro Abs: 7.2 K/uL (ref 1.7–7.7)
Neutrophils Relative %: 65 %
Platelet Count: 240 K/uL (ref 150–400)
RBC: 4.38 MIL/uL (ref 4.22–5.81)
RDW: 16.4 % — ABNORMAL HIGH (ref 11.5–15.5)
WBC Count: 11.1 K/uL — ABNORMAL HIGH (ref 4.0–10.5)
nRBC: 0 % (ref 0.0–0.2)

## 2024-02-23 LAB — CMP (CANCER CENTER ONLY)
ALT: <5 U/L (ref 0–44)
AST: 13 U/L — ABNORMAL LOW (ref 15–41)
Albumin: 3.7 g/dL (ref 3.5–5.0)
Alkaline Phosphatase: 53 U/L (ref 38–126)
Anion gap: 12 (ref 5–15)
BUN: 10 mg/dL (ref 8–23)
CO2: 20 mmol/L — ABNORMAL LOW (ref 22–32)
Calcium: 9.2 mg/dL (ref 8.9–10.3)
Chloride: 108 mmol/L (ref 98–111)
Creatinine: 0.9 mg/dL (ref 0.61–1.24)
GFR, Estimated: >60 mL/min (ref >60)
Glucose, Bld: 126 mg/dL — ABNORMAL HIGH (ref 70–99)
Potassium: 4 mmol/L (ref 3.5–5.1)
Sodium: 139 mmol/L (ref 135–145)
Total Bilirubin: 0.3 mg/dL (ref 0.0–1.2)
Total Protein: 6.8 g/dL (ref 6.5–8.1)

## 2024-02-23 MED ORDER — PREDNISONE 20 MG PO TABS: 20.0000 mg | ORAL_TABLET | Freq: Every day | ORAL | 1 refills | Status: AC

## 2024-02-23 NOTE — Telephone Encounter (Signed)
 Patient has been scheduled for follow-up visit per 02/22/24 LOS.  Pt given an appt calendar with date and time.

## 2024-02-26 ENCOUNTER — Other Ambulatory Visit: Payer: Self-pay | Admitting: Home Health

## 2024-03-14 ENCOUNTER — Ambulatory Visit: Attending: Emergency Medicine | Admitting: Emergency Medicine

## 2024-03-14 ENCOUNTER — Encounter: Payer: Self-pay | Admitting: Emergency Medicine

## 2024-03-14 VITALS — BP 108/72 | HR 85 | Ht 72.0 in | Wt 164.4 lb

## 2024-03-14 DIAGNOSIS — I25111 Atherosclerotic heart disease of native coronary artery with angina pectoris with documented spasm: Secondary | ICD-10-CM

## 2024-03-14 DIAGNOSIS — I10 Essential (primary) hypertension: Secondary | ICD-10-CM

## 2024-03-14 DIAGNOSIS — E782 Mixed hyperlipidemia: Secondary | ICD-10-CM

## 2024-03-14 DIAGNOSIS — I4819 Other persistent atrial fibrillation: Secondary | ICD-10-CM

## 2024-03-14 DIAGNOSIS — C49A4 Gastrointestinal stromal tumor of large intestine: Secondary | ICD-10-CM

## 2024-03-14 DIAGNOSIS — R7303 Prediabetes: Secondary | ICD-10-CM

## 2024-03-14 DIAGNOSIS — I201 Angina pectoris with documented spasm: Secondary | ICD-10-CM

## 2024-03-14 NOTE — Patient Instructions (Signed)
 Medication Instructions:  STOP TAKING ASPIRIN .  CONTINUE WITH ALL OTHER MEDICATION THERAPY.   Lab Work: FASTING LIPID PANEL TO BE DONE TODAY.   Testing/Procedures: NONE  Follow-Up: At [facility], you and your health needs are our priority.  As part of our continuing mission to provide you with exceptional heart care, our providers are all part of one team.  This team includes your primary Cardiologist (physician) and Advanced Practice Providers or APPs (Physician Assistants and Nurse Practitioners) who all work together to provide you with the care you need, when you need it.  Your next appointment:   3 MONTHS  Provider:   Madonna Large, DO       Other Instructions PLEASE BE SURE TO DISCUSS HEART BURN RELIEF OPTIONS WITH YOUR ONCOLOGIST.

## 2024-03-14 NOTE — Progress Notes (Signed)
 Cardiology Office Note:    Date:  03/14/2024  ID:  Jacob Perez, DOB Jan 04, 1951, MRN [id number] PCP: Montey Lot, PA-C  [facility] Providers Cardiologist:  Madonna Large, DO Cardiology APP:  Rana Lum CROME, NP       Patient Profile:       Chief Complaint: 40-month follow-up History of Present Illness:  Jacob Perez is a 73 y.o. male with visit-pertinent history of metastatic GIST tumor, COPD and active smoker, prior congenital heart defect repaired in 1990s, coronary artery disease, NSTEMI, hypertension, hyperlipidemia, persistent atrial fibrillation s/p ablation in 2020,   Patient was seen in the hospital on 11/19/2023 for the evaluation of NSTEMI.  He had presented with chest pain.  He noted that he had been having chest pain on and off for the last several weeks.  He was doing okay on 11/19/2023 and went with his brother fishing and had to leave early to the emergency department due to severe chest pain.  Troponins went from 50s to 170s.  He was started on nitroglycerin  infusion and eventually chest pain subsided.  LHC showed nonobstructive CAD.  There appeared to be moderate disease in the LCx and RCA that improved with IC NTG suggesting a component of vasospasm.  Normal flow analysis of both arteries.  Normal LVEDP.  Echocardiogram showed 50-55%, low normal function, no RWMA, grade 1 DD, normal RV.  He had history of persistent atrial fibrillation however was not on OAC.  Dr. Large recommended DOAC for CHA2DS2-VASc of 3.  He was started on Eliquis  5 mg twice daily.  He was given the okay from his oncologist to begin on DOAC.  Imdur  30 mg daily was also added.  PTA metoprolol  XL 25 mg and Crestor  20 mg were continued.    Of note he has been treated actively for metastatic GI stromal tumor. Per his report and recent oncology notes this has been stable. He also has COPD and is an active smoker. Smokes about 1 pack/day.  Also noted per chart review from [facility] patient has history of atrial  fibrillation ablation in 2020 and flecainide use, no reoccurrence, off flecainide and Xarelto for some time and his cardiologist at [facility] was okay with it.  He does not specifically recall why he was taken off of OAC and states he never had any major bleeding in the past.  He was last seen in office on 12/14/2023.  He was doing well without acute complaints.  Notes no longer having chest pain or anginal equivalent.  He self discontinued Imdur  due to severe migraines.  No medication changes were made.  Follow-up in 3 months.   Discussed the use of AI scribe software for clinical note transcription with the patient, who gave verbal consent to proceed.  History of Present Illness Jacob Perez is a 73 year old male with coronary artery disease who presents for a three-month follow-up.  Today he presents with his wife.  He tells me he is doing well overall and feels good in much improved.  He remains active, engaging in activities like mowing the lawn, weed eating, tree work, and feeding his 13 dogs without exertional symptoms or chest pains.  He does tell me he will experience some substernal/epigastric pain that occurs at rest.  This pain seems to occur after he eats, sometimes occurs nocturnally and the pain is relieved by Tums and burping.  Reports he stopped drinking acidic drinks like eliminated that seem to have improved his symptoms.  He currently denies  any bleeding concerns.  He has had some mild dyspnea on exertion in the past which he says has improved.  No palpitations, orthopnea, PND, leg swelling, syncope, presyncope.  He does tell me he began taking a supplement called painaway which has improved his chronic pain.  Review of systems:  Please see the history of present illness. All other systems are reviewed and otherwise negative.      Studies Reviewed:    EKG Interpretation Date/Time:  Wednesday March 14 2024 09:11:04 EDT Ventricular Rate:  85 PR Interval:  142 QRS  Duration:  104 QT Interval:  390 QTC Calculation: 464 R Axis:   -43  Text Interpretation: Sinus rhythm with occasional Premature ventricular complexes Left axis deviation Minimal voltage criteria for LVH, may be normal variant ( R in aVL ) ST & T wave abnormality, consider lateral ischemia When compared with ECG of 14-Dec-2023 08:33, Premature ventricular complexes are now Present Inverted T waves have replaced nonspecific T wave abnormality in Lateral leads Confirmed by Rana Dixon [phone]) on 03/14/2024 10:07:19 AM    Echocardiogram 11/20/2023 1. Left ventricular ejection fraction, by estimation, is 50 to 55%. The  left ventricle has low normal function. The left ventricle has no regional  wall motion abnormalities. Left ventricular diastolic parameters are  consistent with Grade I diastolic  dysfunction (impaired relaxation).   2. Right ventricular systolic function is normal. The right ventricular  size is normal.   3. The mitral valve is normal in structure. No evidence of mitral valve  regurgitation. No evidence of mitral stenosis.   4. The aortic valve is normal in structure. Aortic valve regurgitation is  not visualized. No aortic stenosis is present.   5. The inferior vena cava is normal in size with greater than 50%  respiratory variability, suggesting right atrial pressure of 3 mmHg.    Cardiac catheterization 11/21/2023 Nonobstructive CAD. There appeared to be moderate disease in the LCx and RCA that improved with IC Ntg suggesting a component of vasospasm. Normal flow analysis of both arteries Normal LVEDP Diagnostic Dominance: Right   Risk Assessment/Calculations:    CHA2DS2-VASc Score = 3   This indicates a 3.2% annual risk of stroke. The patient's score is based upon: CHF History: 0 HTN History: 1 Diabetes History: 0 Stroke History: 0 Vascular Disease History: 1 Age Score: 1 Gender Score: 0             Physical Exam:   VS:  BP 108/72   Pulse 85   Ht 6'  (1.829 m)   Wt 164 lb 6.4 oz (74.6 kg)   SpO2 97%   BMI 22.30 kg/m    Wt Readings from Last 3 Encounters:  03/14/24 164 lb 6.4 oz (74.6 kg)  02/23/24 167 lb 3.2 oz (75.8 kg)  01/02/24 169 lb (76.7 kg)    GEN: Well nourished, well developed in no acute distress NECK: No JVD; No carotid bruits CARDIAC: RRR, no murmurs, rubs, gallops RESPIRATORY:  Clear to auscultation without rales, wheezing or rhonchi  ABDOMEN: Soft, non-tender, non-distended EXTREMITIES:  No edema; No acute deformity      Assessment and Plan:  Coronary artery disease  Coronary vasospasm S/p NSTEMI with LHC on 4/7 showing nonobstructive CAD with moderate disease in LCx and RCA that improved with IC NTG suggesting component of vasospasm.  There was normal flow analysis of both arteries and normal LVEDP Echocardiogram 4/6 with LVEF 50-55%, no RWMA, grade 1 DD Imdur  discontinued in past due to headaches -  Today patient is without any anginal symptoms.  Remains very active including mowing, weed eating, and tree work without limitation or exertional symptoms.   - Reports he can feel when he experiences his coronary vasospasms and he denies any recent occurrences.  There is no indication for further ischemic evaluation at this time - He does describe some epigastric pressure/burning occurring after eating and sometimes nocturnally that is relieved by Tums and burping.  Recently improved after decreasing acidic drinks.  I do recommend he follow-up with oncologist regarding daily PPI to help with his heartburn as he currently takes oral chemotherapy agent for his history of GIST - He is currently taking both ASA and Eliquis .  Today I will discontinue his ASA to decrease his chances of bleeding given his history of GIST. - Continue metoprolol  XL 25 mg daily, rosuvastatin  20 mg daily, nitroglycerin  as needed - Continue Eliquis    History of congenital heart disease status post surgery 1990s He says that he had a hole in the  heart since birth and was repaired in 1990s. However the nature of the hole is not clear and we do not have any documents from his cardiac surgery in the past. Will need antibiotic prophylaxis prior to dental procedures.    Hypertension Blood pressure today is 108/72 and under adequate control - Continue metoprolol  XL 25 mg daily   Hyperlipidemia, LDL <70 Lipoprotein (a) 39.7 on 11/2023 LDL 142, HDL 34, TG 104, TC 197 on 11/7972 Repeat fasting lipid panel today and pending results could titrate up current statin therapy to reach goal - Continue rosuvastatin  20 mg daily   Persistent atrial fibrillation S/p ablation 2020 EKG today shows patient is maintaining sinus rhythm - He denies any symptoms concerning for recurrent atrial fibrillation - He was cleared by his medical oncologist to be on anticoagulation - Continue Eliquis  5 mg twice daily, no bleeding concerns, recent CBC was stable   GIST tumor Currently on regorafenib   - Managed by oncology  Prediabetes A1c 5.8% on 11/2023 - Management per PCP      Dispo:  Return in about 3 months (around 06/14/2024).  Signed, Lum LITTIE Louis, NP

## 2024-03-15 ENCOUNTER — Ambulatory Visit: Payer: Self-pay | Admitting: Emergency Medicine

## 2024-03-15 LAB — LIPID PANEL
Chol/HDL Ratio: 4.3 ratio (ref 0.0–5.0)
Cholesterol, Total: 126 mg/dL (ref 100–199)
HDL: 29 mg/dL — ABNORMAL LOW (ref >39)
LDL Chol Calc (NIH): 69 mg/dL (ref 0–99)
Triglycerides: 160 mg/dL — ABNORMAL HIGH (ref 0–149)
VLDL Cholesterol Cal: 28 mg/dL (ref 5–40)

## 2024-03-30 ENCOUNTER — Other Ambulatory Visit: Payer: Self-pay | Admitting: Emergency Medicine

## 2024-03-30 MED ORDER — ROSUVASTATIN CALCIUM 20 MG PO TABS: 20.0000 mg | ORAL_TABLET | Freq: Every day | ORAL | 10 refills | Status: AC

## 2024-04-04 ENCOUNTER — Other Ambulatory Visit: Payer: Self-pay | Admitting: Home Health

## 2024-04-04 DIAGNOSIS — I4819 Other persistent atrial fibrillation: Secondary | ICD-10-CM

## 2024-04-04 DIAGNOSIS — I4891 Unspecified atrial fibrillation: Secondary | ICD-10-CM

## 2024-04-04 NOTE — Telephone Encounter (Signed)
 Eliquis  5mg  refill request received. Patient is 73 years old, weight-74.6kg, Crea-0.90 on 02/23/24, Diagnosis-Afib, and last seen by Lum Louis on 03/14/24. Dose is appropriate based on dosing criteria. Will send in refill to requested pharmacy.

## 2024-04-05 ENCOUNTER — Other Ambulatory Visit: Payer: Self-pay

## 2024-04-05 DIAGNOSIS — G8929 Other chronic pain: Secondary | ICD-10-CM

## 2024-04-05 DIAGNOSIS — M5387 Other specified dorsopathies, lumbosacral region: Secondary | ICD-10-CM

## 2024-04-05 DIAGNOSIS — M51369 Other intervertebral disc degeneration, lumbar region without mention of lumbar back pain or lower extremity pain: Secondary | ICD-10-CM

## 2024-04-05 DIAGNOSIS — R109 Unspecified abdominal pain: Secondary | ICD-10-CM

## 2024-04-05 MED ORDER — OXYCODONE HCL 10 MG PO TABS: 10.0000 mg | ORAL_TABLET | ORAL | 0 refills | Status: DC | PRN

## 2024-04-06 ENCOUNTER — Telehealth: Payer: Self-pay

## 2024-04-06 NOTE — Telephone Encounter (Signed)
   Patient Name: Jacob Perez  DOB: [birth year] MRN: [id number]  Primary Cardiologist: Madonna Large, DO  Chart reviewed as part of pre-operative protocol coverage. Cataract extractions are recognized in guidelines as low risk surgeries that do not typically require specific preoperative testing or holding of blood thinner therapy. Therefore, given past medical history and time since last visit, based on ACC/AHA guidelines, Jacob Perez would be at acceptable risk for the planned procedure without further cardiovascular testing.   I will route this recommendation to the requesting party via Epic fax function and remove from pre-op pool.  Please call with questions.  Lamarr Satterfield, NP 04/06/2024, 4:13 PM

## 2024-04-06 NOTE — Telephone Encounter (Signed)
   Pre-operative Risk Assessment    Patient Name: Jacob Perez  DOB: 18-Oct-1950 MRN: [id number]   Date of last office visit: 03/14/24 Date of next office visit: 06/19/24   Request for Surgical Clearance    Procedure:  Cataract extraction with intraocular ens implantation of the right eye followed by the left eye  Date of Surgery:  Clearance 04/30/24                                Surgeon:  Not provided Surgeon's Group or Practice Name:  [facility] Phone number:  [phone] Fax number:  [phone]   Type of Clearance Requested:   - Medical    Type of Anesthesia:  Not Indicated   Additional requests/questions:    Bonney Ival LOISE Gerome   04/06/2024, 3:59 PM

## 2024-04-12 ENCOUNTER — Ambulatory Visit (HOSPITAL_BASED_OUTPATIENT_CLINIC_OR_DEPARTMENT_OTHER)
Admission: RE | Admit: 2024-04-12 | Discharge: 2024-04-12 | Disposition: A | Discharge status: Home / Self Care | Source: Ambulatory Visit | Attending: Oncology | Admitting: Oncology

## 2024-04-12 ENCOUNTER — Inpatient Hospital Stay: Attending: Oncology

## 2024-04-12 DIAGNOSIS — C49A0 Gastrointestinal stromal tumor, unspecified site: Secondary | ICD-10-CM | POA: Diagnosis present

## 2024-04-12 DIAGNOSIS — C481 Malignant neoplasm of specified parts of peritoneum: Secondary | ICD-10-CM

## 2024-04-12 LAB — CBC WITH DIFFERENTIAL (CANCER CENTER ONLY)
Abs Immature Granulocytes: 0.04 K/uL (ref 0.00–0.07)
Basophils Absolute: 0.1 K/uL (ref 0.0–0.1)
Basophils Relative: 0 %
Eosinophils Absolute: 0.3 K/uL (ref 0.0–0.5)
Eosinophils Relative: 2 %
HCT: 42.5 % (ref 39.0–52.0)
Hemoglobin: 14.5 g/dL (ref 13.0–17.0)
Immature Granulocytes: 0 %
Lymphocytes Relative: 36 %
Lymphs Abs: 4.4 K/uL — ABNORMAL HIGH (ref 0.7–4.0)
MCH: 30.6 pg (ref 26.0–34.0)
MCHC: 34.1 g/dL (ref 30.0–36.0)
MCV: 89.7 fL (ref 80.0–100.0)
Monocytes Absolute: 0.8 K/uL (ref 0.1–1.0)
Monocytes Relative: 6 %
Neutro Abs: 6.7 K/uL (ref 1.7–7.7)
Neutrophils Relative %: 56 %
Platelet Count: 293 K/uL (ref 150–400)
RBC: 4.74 MIL/uL (ref 4.22–5.81)
RDW: 16.4 % — ABNORMAL HIGH (ref 11.5–15.5)
WBC Count: 12.3 K/uL — ABNORMAL HIGH (ref 4.0–10.5)
nRBC: 0 % (ref 0.0–0.2)

## 2024-04-12 LAB — CMP (CANCER CENTER ONLY)
ALT: 9 U/L (ref 0–44)
AST: 15 U/L (ref 15–41)
Albumin: 3.8 g/dL (ref 3.5–5.0)
Alkaline Phosphatase: 50 U/L (ref 38–126)
Anion gap: 12 (ref 5–15)
BUN: 28 mg/dL — ABNORMAL HIGH (ref 8–23)
CO2: 22 mmol/L (ref 22–32)
Calcium: 8.6 mg/dL — ABNORMAL LOW (ref 8.9–10.3)
Chloride: 108 mmol/L (ref 98–111)
Creatinine: 1.09 mg/dL (ref 0.61–1.24)
GFR, Estimated: >60 mL/min (ref >60)
Glucose, Bld: 112 mg/dL — ABNORMAL HIGH (ref 70–99)
Potassium: 4.5 mmol/L (ref 3.5–5.1)
Sodium: 143 mmol/L (ref 135–145)
Total Bilirubin: 0.5 mg/dL (ref 0.0–1.2)
Total Protein: 7 g/dL (ref 6.5–8.1)

## 2024-04-12 MED ORDER — IOHEXOL 300 MG/ML  SOLN
100.0000 mL | Freq: Once | INTRAMUSCULAR | Status: AC | PRN
  Administered 2024-04-12: 100 mL via INTRAVENOUS

## 2024-04-18 ENCOUNTER — Other Ambulatory Visit

## 2024-04-18 NOTE — Progress Notes (Unsigned)
 [facility]  [address] [place],  [place]  [address] [phone]  Clinic Day:  02/23/2024  Referring physician: Montey Lot, PA-C  HISTORY OF PRESENT ILLNESS:  The patient is a 73 y.o. male with metastatic gastrointestinal stromal tumor, including multiple tumor deposits throughout his abdomen.  He comes in today to be evaluated for heading into his 21st cycle of regorafenib , which is being given at 80 mg daily, every 3 out of 4 weeks.  The patient has tolerated his past 2 cycles of regorafenib  fairly well.  Fatigue remains an issue, but it has not been as prominent as it has been previously.  However, he denies having any new symptoms or findings which concern him for overt signs of disease progression.  With respect to this gentleman's metastatic GIST, he was placed on imatinib in August 2021.  His disease was kept under control with this agent until disease progression was seen on scans in August 2023.  This led to him being switched to sunitinib.  However, after just 2 cycles of sunitinib, scans showed disease progression to where he was switched to regorafenib , for which he continues to take.    PHYSICAL EXAM:  There were no vitals taken for this visit. Wt Readings from Last 3 Encounters:  03/14/24 164 lb 6.4 oz (74.6 kg)  02/23/24 167 lb 3.2 oz (75.8 kg)  01/02/24 169 lb (76.7 kg)   There is no height or weight on file to calculate BMI. Performance status (ECOG): 1 - Symptomatic but completely ambulatory Physical Exam Constitutional:      Appearance: Normal appearance. He is not ill-appearing.  HENT:     Mouth/Throat:     Mouth: Mucous membranes are moist.     Pharynx: Oropharynx is clear. No oropharyngeal exudate or posterior oropharyngeal erythema.  Cardiovascular:     Rate and Rhythm: Normal rate and regular rhythm.     Heart sounds: No murmur heard.    No friction rub. No gallop.  Pulmonary:     Effort: Pulmonary effort is normal.  No respiratory distress.     Breath sounds: Normal breath sounds. No wheezing, rhonchi or rales.  Abdominal:     General: Bowel sounds are normal. There is no distension.     Palpations: Abdomen is soft. There is no mass.     Tenderness: There is no abdominal tenderness.  Musculoskeletal:        General: No swelling.     Right lower leg: No edema.     Left lower leg: No edema.  Lymphadenopathy:     Cervical: No cervical adenopathy.     Upper Body:     Right upper body: No supraclavicular or axillary adenopathy.     Left upper body: No supraclavicular or axillary adenopathy.     Lower Body: No right inguinal adenopathy. No left inguinal adenopathy.  Skin:    General: Skin is warm.     Coloration: Skin is not jaundiced.     Findings: No lesion or rash.  Neurological:     General: No focal deficit present.     Mental Status: He is alert and oriented to person, place, and time. Mental status is at baseline.  Psychiatric:        Mood and Affect: Mood normal.        Behavior: Behavior normal.        Thought Content: Thought content normal.   SCANS:  CT scans of his chest/abdomen/pelvis revealed the following:  FINDINGS: CT CHEST FINDINGS   Cardiovascular: Aortic atherosclerosis. Normal heart size. Left coronary artery calcifications. No pericardial effusion.   Mediastinum/Nodes: No enlarged mediastinal, hilar, or axillary lymph nodes. Thyroid  gland, trachea, and esophagus demonstrate no significant findings.   Lungs/Pleura: Unchanged lobulated nodule of the dependent left lower lobe measuring 1.7 x 1.2 cm (series 302, image 78). Severe emphysema. Diffuse bilateral bronchial wall thickening. No pleural effusion or pneumothorax.   Musculoskeletal: No chest wall abnormality. No acute osseous findings.   CT ABDOMEN PELVIS FINDINGS   Hepatobiliary: No solid liver abnormality is seen. No gallstones, gallbladder wall thickening, or biliary dilatation.   Pancreas: Unremarkable.  No pancreatic ductal dilatation or surrounding inflammatory changes.   Spleen: Normal in size without significant abnormality.   Adrenals/Urinary Tract: Adrenal glands are unremarkable. Kidneys are normal, without renal calculi, solid lesion, or hydronephrosis. Bladder is unremarkable.   Stomach/Bowel: Stomach is within normal limits. Appendix not clearly visualized. No evidence of bowel wall thickening, distention, or inflammatory changes.   Vascular/Lymphatic: Aortic atherosclerosis. No enlarged abdominal or pelvic lymph nodes.   Reproductive: No mass or other abnormality.   Other: Evidence of bilateral inguinal hernia repair. No ascites. Unchanged hypodense mass in the right retroperitoneum measuring 5.8 x 5.5 cm (series 301, image 71). Unchanged mass in the posterior right hemipelvis measuring 6.0 x 3.3 cm (series 301, image 113). Unchanged scattered peritoneal nodularity elsewhere, for example in the right upper quadrant (series 301, image 79) and in the left hemiabdomen (series 301, image 86).   Musculoskeletal: No acute osseous findings.   IMPRESSION: 1. Unchanged masses in the right retroperitoneum and right pelvis as well as peritoneal nodularity. 2. Unchanged left lower lobe pulmonary nodule. 3. No new evidence of metastatic disease in the chest, abdomen, or pelvis. 4. Severe emphysema and diffuse bilateral bronchial wall thickening. 5. Coronary artery disease.   Aortic Atherosclerosis (ICD10-I70.0) and Emphysema (ICD10-J43.9). ASSESSMENT & PLAN:  Assessment/Plan:  A 73 y.o. male with metastatic gastrointestinal stromal tumor (GIST).  The patient will proceed with his 21st cycle of regorafenib , which he actually started yesterday..  Clinically, he appears to be doing fairly well.  With respect to his fatigue, I will rewrite his prednisone  prescription, which has helped with his energy level and appetite.  Otherwise, I will see this patient back in 8 weeks before he  heads into a potential 23rd cycle of treatment.  CT scans will be done a day before his next visit to ascertain his new disease baseline after 22 cycles of regorafenib .  The patient understands all the plans discussed today and is in agreement with them.    Travaughn Vue DELENA Kerns, MD

## 2024-04-19 ENCOUNTER — Telehealth: Payer: Self-pay | Admitting: Oncology

## 2024-04-19 ENCOUNTER — Other Ambulatory Visit: Payer: Self-pay | Admitting: Oncology

## 2024-04-19 ENCOUNTER — Inpatient Hospital Stay: Attending: Oncology | Admitting: Oncology

## 2024-04-19 VITALS — BP 140/70 | HR 86 | Temp 98.0°F | Resp 16 | Ht 72.0 in | Wt 174.8 lb

## 2024-04-19 DIAGNOSIS — C49A4 Gastrointestinal stromal tumor of large intestine: Secondary | ICD-10-CM

## 2024-04-19 DIAGNOSIS — C49A0 Gastrointestinal stromal tumor, unspecified site: Secondary | ICD-10-CM | POA: Insufficient documentation

## 2024-04-19 NOTE — Telephone Encounter (Signed)
 Patient has been scheduled for follow-up visit per 04/19/24 LOS.  Pt given an appt calendar with date and time.

## 2024-04-30 ENCOUNTER — Other Ambulatory Visit: Payer: Self-pay | Admitting: Hematology and Oncology

## 2024-04-30 MED ORDER — REGORAFENIB 40 MG PO TABS: 80.0000 mg | ORAL_TABLET | Freq: Every day | ORAL | 3 refills | Status: DC

## 2024-06-17 ENCOUNTER — Other Ambulatory Visit: Payer: Self-pay | Admitting: Oncology

## 2024-06-19 ENCOUNTER — Ambulatory Visit: Admitting: Cardiology

## 2024-06-25 ENCOUNTER — Other Ambulatory Visit: Payer: Self-pay | Admitting: Hematology and Oncology

## 2024-06-25 MED ORDER — REGORAFENIB 40 MG PO TABS: 80.0000 mg | ORAL_TABLET | Freq: Every day | ORAL | 3 refills | Status: AC

## 2024-07-06 ENCOUNTER — Other Ambulatory Visit: Payer: Self-pay | Admitting: Hematology and Oncology

## 2024-07-06 DIAGNOSIS — M5387 Other specified dorsopathies, lumbosacral region: Secondary | ICD-10-CM

## 2024-07-06 DIAGNOSIS — R109 Unspecified abdominal pain: Secondary | ICD-10-CM

## 2024-07-06 DIAGNOSIS — G8929 Other chronic pain: Secondary | ICD-10-CM

## 2024-07-06 DIAGNOSIS — M51369 Other intervertebral disc degeneration, lumbar region without mention of lumbar back pain or lower extremity pain: Secondary | ICD-10-CM

## 2024-07-06 MED ORDER — OXYCODONE HCL 10 MG PO TABS: 10.0000 mg | ORAL_TABLET | ORAL | 0 refills | Status: AC | PRN

## 2024-07-23 ENCOUNTER — Telehealth: Payer: Self-pay

## 2024-07-23 NOTE — Telephone Encounter (Signed)
 Oral Oncology Patient Advocate Encounter   Received notification that patient is due for re-enrollment for assistance for Stivarga  through [employer].   Re-enrollment process has been initiated and will be submitted upon completion of necessary documents. Re-enrollment submitted via online portal.  [employer] phone number [phone].   I will continue to follow until final determination.  Lucie Lamer, CPhT [place]  [facility] Oncology Pharmacy Patient Advocate Specialist [facility] [place] Phone: [phone]  Fax: [phone] [email or web address]

## 2024-07-24 NOTE — Telephone Encounter (Signed)
 Oral Oncology Patient Advocate Encounter   Received notification re-enrollment for assistance for Stivarga  through [employer] has been approved. Patient may continue to receive their medication at $0 from this program.     [employer] phone number [phone].    Effective dates: 08/16/24 through 08/15/25  I have spoken to the patient.  Lucie Lamer, CPhT [place]  [facility] Oncology Pharmacy Patient Advocate Specialist [facility] [place] Phone: [phone]  Fax: [phone] [email or web address]

## 2024-08-20 ENCOUNTER — Other Ambulatory Visit

## 2024-08-21 ENCOUNTER — Other Ambulatory Visit

## 2024-08-21 ENCOUNTER — Ambulatory Visit: Admitting: Oncology

## 2024-08-23 ENCOUNTER — Other Ambulatory Visit

## 2024-08-23 ENCOUNTER — Other Ambulatory Visit (HOSPITAL_BASED_OUTPATIENT_CLINIC_OR_DEPARTMENT_OTHER): Admitting: Radiology

## 2024-08-23 LAB — LAB REPORT - SCANNED: EGFR: 99

## 2024-08-23 NOTE — Progress Notes (Signed)
 " [facility] [address] [place],  [place]  [address] [phone]  Clinic Day:  05/24/2025  Referring physician: Montey Lot, PA-C  HISTORY OF PRESENT ILLNESS:  The patient is a 74 y.o. male with metastatic gastrointestinal stromal tumor, including multiple tumor deposits throughout his abdomen.  He comes in today to go over his CT scans to ascertain his new disease baseline.  He has been taking regorafenib  since November 2023, which is being given at 80 mg daily, every 3 out of 4 weeks.  The patient continues to tolerate his regorafenib  fairly well.   He has noticed more neuropathy in his feet recently, for which gabapentin  has been prescribed.  As it pertains to his GIST, he denies having any new symptoms or findings which concern him for overt signs of disease progression.  With respect to this gentleman's metastatic GIST, he was placed on imatinib in August 2021.  His disease was kept under control with this agent until disease progression was seen on scans in August 2023.  This led to him being switched to sunitinib.  However, after just 2 cycles of sunitinib, scans showed disease progression to where he was switched to regorafenib , for which he continues to take.    PHYSICAL EXAM:  Blood pressure (!) 140/90, pulse 84, temperature 97.9 F (36.6 C), temperature source Oral, resp. rate 16, height 6' (1.829 m), weight 158 lb 3.2 oz (71.8 kg), SpO2 99%. Wt Readings from Last 3 Encounters:  08/24/24 158 lb 3.2 oz (71.8 kg)  04/19/24 174 lb 12.8 oz (79.3 kg)  03/14/24 164 lb 6.4 oz (74.6 kg)   Body mass index is 21.46 kg/m. Performance status (ECOG): 1 - Symptomatic but completely ambulatory Physical Exam Constitutional:      Appearance: Normal appearance. He is not ill-appearing.     Comments: He is thinner vs previous visits.    HENT:     Mouth/Throat:     Mouth: Mucous membranes are moist.     Pharynx: Oropharynx is clear. No oropharyngeal  exudate or posterior oropharyngeal erythema.  Cardiovascular:     Rate and Rhythm: Normal rate and regular rhythm.     Heart sounds: No murmur heard.    No friction rub. No gallop.  Pulmonary:     Effort: Pulmonary effort is normal. No respiratory distress.     Breath sounds: Normal breath sounds. No wheezing, rhonchi or rales.  Abdominal:     General: Bowel sounds are normal. There is no distension.     Palpations: Abdomen is soft. There is no mass.     Tenderness: There is no abdominal tenderness.  Musculoskeletal:        General: No swelling.     Right lower leg: No edema.     Left lower leg: No edema.  Lymphadenopathy:     Cervical: No cervical adenopathy.     Upper Body:     Right upper body: No supraclavicular or axillary adenopathy.     Left upper body: No supraclavicular or axillary adenopathy.     Lower Body: No right inguinal adenopathy. No left inguinal adenopathy.  Skin:    General: Skin is warm.     Coloration: Skin is not jaundiced.     Findings: No lesion or rash.  Neurological:     General: No focal deficit present.     Mental Status: He is alert and oriented to person, place, and time. Mental status is at baseline.  Psychiatric:  Mood and Affect: Mood normal.        Behavior: Behavior normal.        Thought Content: Thought content normal.   SCANS:  CT scans of his chest/abdomen/pelvis revealed the following: IMPRESSION:  1. A well-defined bilobulated solid heterogeneous enhancing lesion measuring 115 x 61 x 52 mm is present in the right pelvis adjacent to the rectum causing mild mass effect on the urinary bladder and common iliac vessels, with scattered necrosis - neoplastic etiology - suggested HPE correlation.  2. A 10 x 9 mm pleural-based solid nodule is noted in the lateral basal segment of the left lower lobe - lung RADS-3  3. Panlobular and paraseptal emphysematous changes predominantly in the upper lobes and peripheral lower lobes of both  lungs.  4. Mild interlobular septal thickening and fibrotic bands in the right lower lobe.  5. Few subcentimetric subcarinal and subaortic lymph nodes without significant mediastinal lymphadenopathy.  6. Few small similar density peritoneal lesions adjacent to the ascending colon measuring 10.6 mm, without significant fat stranding.  7. Mild degenerative changes of the lumbar spine, with T5 vertebral hemangioma.  8. Small umbilical hernia with omental herniation.  9. The liver is normal in size with mildly coarse surface; gallbladder, pancreas, spleen, and both adrenal glands are normal.  10. No hydronephrosis or renal calculi identified. Renal volumes could not be calculated due to lack of measurement data.  11. Other chronic and ancillary findings as described above.  ASSESSMENT & PLAN:  Assessment/Plan:  A 74 y.o. male with metastatic gastrointestinal stromal tumor (GIST).  In clinic today, I went over all of his CT scan images with him, for which he could see that his disease remains stable.  I remain very pleased with how well his regorafenib  has prevented any obvious disease progression over the past 2+ years.  He will continue to take this agent at 80 mg daily, every 3 out of 4 weeks.  With respect to his weight loss, he admits he has not been using his Megace  routinely, which can stimulate his appetite.  He knows to begin using it routinely, in hopes of leading to weight gain over time.  Otherwise, as he is clinically doing well, I will see him back in 4 months for repeat clinical assessment.  CT scans  will be done a day before his next visit to ascertain his new disease baseline while remaining on regorafenib .  The patient understands all the plans discussed today and is in agreement with them.    Libero Puthoff DELENA Kerns, MD      "

## 2024-08-24 ENCOUNTER — Other Ambulatory Visit: Payer: Self-pay | Admitting: Oncology

## 2024-08-24 ENCOUNTER — Inpatient Hospital Stay: Attending: Oncology | Admitting: Oncology

## 2024-08-24 VITALS — BP 140/90 | HR 84 | Temp 97.9°F | Resp 16 | Ht 72.0 in | Wt 158.2 lb

## 2024-08-24 DIAGNOSIS — C49A4 Gastrointestinal stromal tumor of large intestine: Secondary | ICD-10-CM

## 2024-08-24 DIAGNOSIS — C49A0 Gastrointestinal stromal tumor, unspecified site: Secondary | ICD-10-CM | POA: Diagnosis present

## 2024-09-03 ENCOUNTER — Encounter: Payer: Self-pay | Admitting: Oncology

## 2024-09-04 ENCOUNTER — Other Ambulatory Visit: Payer: Self-pay | Admitting: Hematology and Oncology

## 2024-09-04 MED ORDER — MEGESTROL ACETATE 400 MG/10ML PO SUSP: 800.0000 mg | Freq: Every day | ORAL | 0 refills | Status: AC

## 2024-09-18 ENCOUNTER — Telehealth: Payer: Self-pay | Admitting: Dietician

## 2024-12-20 ENCOUNTER — Inpatient Hospital Stay

## 2024-12-21 ENCOUNTER — Inpatient Hospital Stay: Admitting: Oncology

## 2024-12-21 ENCOUNTER — Inpatient Hospital Stay
